# Patient Record
Sex: Male | Born: 1965
Health system: Southern US, Community
[De-identification: ages and names within clinical notes are randomized; demographics above are authoritative.]

## PROBLEM LIST (undated history)

## (undated) DIAGNOSIS — F329 Major depressive disorder, single episode, unspecified: Secondary | ICD-10-CM

## (undated) DIAGNOSIS — E785 Hyperlipidemia, unspecified: Secondary | ICD-10-CM

## (undated) DIAGNOSIS — F1910 Other psychoactive substance abuse, uncomplicated: Secondary | ICD-10-CM

## (undated) DIAGNOSIS — T4145XA Adverse effect of unspecified anesthetic, initial encounter: Secondary | ICD-10-CM

## (undated) DIAGNOSIS — M109 Gout, unspecified: Secondary | ICD-10-CM

## (undated) DIAGNOSIS — F32A Depression, unspecified: Secondary | ICD-10-CM

## (undated) DIAGNOSIS — R251 Tremor, unspecified: Secondary | ICD-10-CM

## (undated) DIAGNOSIS — T8859XA Other complications of anesthesia, initial encounter: Secondary | ICD-10-CM

## (undated) DIAGNOSIS — D869 Sarcoidosis, unspecified: Secondary | ICD-10-CM

## (undated) DIAGNOSIS — B009 Herpesviral infection, unspecified: Secondary | ICD-10-CM

## (undated) DIAGNOSIS — T7840XA Allergy, unspecified, initial encounter: Secondary | ICD-10-CM

## (undated) DIAGNOSIS — K219 Gastro-esophageal reflux disease without esophagitis: Secondary | ICD-10-CM

## (undated) DIAGNOSIS — M545 Low back pain, unspecified: Secondary | ICD-10-CM

## (undated) DIAGNOSIS — I10 Essential (primary) hypertension: Secondary | ICD-10-CM

## (undated) HISTORY — DX: Low back pain, unspecified: M54.50

## (undated) HISTORY — DX: Depression, unspecified: F32.A

## (undated) HISTORY — DX: Herpesviral infection, unspecified: B00.9

## (undated) HISTORY — PX: UMBILICAL HERNIA REPAIR: SHX196

## (undated) HISTORY — DX: Gout, unspecified: M10.9

## (undated) HISTORY — DX: Other psychoactive substance abuse, uncomplicated: F19.10

## (undated) HISTORY — DX: Allergy, unspecified, initial encounter: T78.40XA

## (undated) HISTORY — DX: Hyperlipidemia, unspecified: E78.5

## (undated) HISTORY — DX: Major depressive disorder, single episode, unspecified: F32.9

## (undated) HISTORY — DX: Low back pain: M54.5

## (undated) HISTORY — PX: APPENDECTOMY: SHX54

## (undated) HISTORY — DX: Sarcoidosis, unspecified: D86.9

## (undated) HISTORY — PX: BACK SURGERY: SHX140

## (undated) SURGERY — Surgical Case
Anesthesia: *Unknown

---

## 1993-08-28 HISTORY — PX: KNEE ARTHROPLASTY: SHX992

## 1998-09-14 ENCOUNTER — Ambulatory Visit (HOSPITAL_COMMUNITY): Admission: RE | Admit: 1998-09-14 | Discharge: 1998-09-14 | Payer: Self-pay | Admitting: Internal Medicine

## 2001-11-05 ENCOUNTER — Ambulatory Visit (HOSPITAL_BASED_OUTPATIENT_CLINIC_OR_DEPARTMENT_OTHER): Admission: RE | Admit: 2001-11-05 | Discharge: 2001-11-05 | Payer: Self-pay | Admitting: General Surgery

## 2003-01-31 ENCOUNTER — Encounter: Payer: Self-pay | Admitting: Internal Medicine

## 2003-01-31 ENCOUNTER — Encounter: Admission: RE | Admit: 2003-01-31 | Discharge: 2003-01-31 | Payer: Self-pay | Admitting: Internal Medicine

## 2003-05-11 ENCOUNTER — Encounter: Payer: Self-pay | Admitting: Emergency Medicine

## 2003-05-11 ENCOUNTER — Observation Stay (HOSPITAL_COMMUNITY): Admission: EM | Admit: 2003-05-11 | Discharge: 2003-05-12 | Payer: Self-pay | Admitting: Emergency Medicine

## 2003-05-15 ENCOUNTER — Encounter: Payer: Self-pay | Admitting: Internal Medicine

## 2003-05-15 ENCOUNTER — Encounter: Admission: RE | Admit: 2003-05-15 | Discharge: 2003-05-15 | Payer: Self-pay | Admitting: Internal Medicine

## 2003-05-18 ENCOUNTER — Encounter: Admission: RE | Admit: 2003-05-18 | Discharge: 2003-05-18 | Payer: Self-pay | Admitting: Internal Medicine

## 2003-05-18 ENCOUNTER — Encounter: Payer: Self-pay | Admitting: Internal Medicine

## 2003-05-19 ENCOUNTER — Encounter (INDEPENDENT_AMBULATORY_CARE_PROVIDER_SITE_OTHER): Payer: Self-pay

## 2003-05-19 ENCOUNTER — Ambulatory Visit: Admission: RE | Admit: 2003-05-19 | Discharge: 2003-05-19 | Payer: Self-pay | Admitting: Internal Medicine

## 2003-09-18 ENCOUNTER — Ambulatory Visit (HOSPITAL_BASED_OUTPATIENT_CLINIC_OR_DEPARTMENT_OTHER): Admission: RE | Admit: 2003-09-18 | Discharge: 2003-09-18 | Payer: Self-pay | Admitting: Internal Medicine

## 2003-10-11 ENCOUNTER — Emergency Department (HOSPITAL_COMMUNITY): Admission: AD | Admit: 2003-10-11 | Discharge: 2003-10-11 | Payer: Self-pay | Admitting: Family Medicine

## 2003-12-25 ENCOUNTER — Emergency Department (HOSPITAL_COMMUNITY): Admission: EM | Admit: 2003-12-25 | Discharge: 2003-12-25 | Payer: Self-pay | Admitting: Family Medicine

## 2004-08-09 ENCOUNTER — Encounter: Admission: RE | Admit: 2004-08-09 | Discharge: 2004-11-07 | Payer: Self-pay | Admitting: Internal Medicine

## 2004-10-07 ENCOUNTER — Encounter: Admission: RE | Admit: 2004-10-07 | Discharge: 2004-10-07 | Payer: Self-pay | Admitting: Internal Medicine

## 2007-01-08 ENCOUNTER — Emergency Department (HOSPITAL_COMMUNITY): Admission: EM | Admit: 2007-01-08 | Discharge: 2007-01-08 | Payer: Self-pay | Admitting: Family Medicine

## 2007-02-01 ENCOUNTER — Ambulatory Visit: Payer: Self-pay | Admitting: Internal Medicine

## 2007-10-30 ENCOUNTER — Ambulatory Visit: Payer: Self-pay | Admitting: Internal Medicine

## 2007-10-30 DIAGNOSIS — D869 Sarcoidosis, unspecified: Secondary | ICD-10-CM

## 2007-10-30 DIAGNOSIS — G4700 Insomnia, unspecified: Secondary | ICD-10-CM

## 2007-11-04 ENCOUNTER — Telehealth (INDEPENDENT_AMBULATORY_CARE_PROVIDER_SITE_OTHER): Payer: Self-pay | Admitting: *Deleted

## 2007-11-08 ENCOUNTER — Emergency Department (HOSPITAL_COMMUNITY): Admission: EM | Admit: 2007-11-08 | Discharge: 2007-11-08 | Payer: Self-pay | Admitting: Emergency Medicine

## 2007-11-13 ENCOUNTER — Ambulatory Visit: Payer: Self-pay | Admitting: Adult Health

## 2007-11-13 DIAGNOSIS — J019 Acute sinusitis, unspecified: Secondary | ICD-10-CM | POA: Insufficient documentation

## 2007-11-26 ENCOUNTER — Telehealth: Payer: Self-pay | Admitting: Internal Medicine

## 2007-12-04 ENCOUNTER — Ambulatory Visit: Payer: Self-pay | Admitting: Internal Medicine

## 2008-01-08 ENCOUNTER — Ambulatory Visit: Payer: Self-pay | Admitting: Internal Medicine

## 2008-02-06 ENCOUNTER — Ambulatory Visit: Payer: Self-pay | Admitting: Internal Medicine

## 2008-10-08 ENCOUNTER — Ambulatory Visit: Payer: Self-pay | Admitting: Internal Medicine

## 2008-10-19 ENCOUNTER — Ambulatory Visit: Payer: Self-pay | Admitting: Internal Medicine

## 2008-11-02 ENCOUNTER — Ambulatory Visit: Payer: Self-pay | Admitting: Internal Medicine

## 2009-01-14 ENCOUNTER — Ambulatory Visit: Payer: Self-pay | Admitting: Internal Medicine

## 2009-05-20 ENCOUNTER — Encounter: Payer: Self-pay | Admitting: Internal Medicine

## 2009-05-28 ENCOUNTER — Ambulatory Visit: Payer: Self-pay | Admitting: Internal Medicine

## 2009-07-27 ENCOUNTER — Ambulatory Visit: Payer: Self-pay | Admitting: Internal Medicine

## 2009-07-27 DIAGNOSIS — J069 Acute upper respiratory infection, unspecified: Secondary | ICD-10-CM | POA: Insufficient documentation

## 2010-01-18 ENCOUNTER — Ambulatory Visit: Payer: Self-pay | Admitting: Internal Medicine

## 2010-02-22 ENCOUNTER — Ambulatory Visit: Payer: Self-pay | Admitting: Internal Medicine

## 2010-02-22 DIAGNOSIS — K219 Gastro-esophageal reflux disease without esophagitis: Secondary | ICD-10-CM

## 2010-02-22 DIAGNOSIS — I10 Essential (primary) hypertension: Secondary | ICD-10-CM

## 2010-02-23 LAB — CONVERTED CEMR LAB
Albumin: 4.2 g/dL (ref 3.5–5.2)
Alkaline Phosphatase: 79 units/L (ref 39–117)
Basophils Relative: 0.8 % (ref 0.0–3.0)
Calcium: 8.9 mg/dL (ref 8.4–10.5)
Creatinine, Ser: 0.7 mg/dL (ref 0.4–1.5)
Eosinophils Relative: 4 % (ref 0.0–5.0)
GFR calc non Af Amer: 125.34 mL/min (ref >60)
Glucose, Bld: 76 mg/dL (ref 70–99)
Hemoglobin: 13.9 g/dL (ref 13.0–17.0)
Lymphocytes Relative: 21 % (ref 12.0–46.0)
MCHC: 34 g/dL (ref 30.0–36.0)
Monocytes Relative: 9.6 % (ref 3.0–12.0)
Neutro Abs: 3.6 10*3/uL (ref 1.4–7.7)
RBC: 4.5 M/uL (ref 4.22–5.81)
Sodium: 141 meq/L (ref 135–145)
TSH: 0.92 microintl units/mL (ref 0.35–5.50)
WBC: 5.7 10*3/uL (ref 4.5–10.5)

## 2010-02-24 ENCOUNTER — Ambulatory Visit: Payer: Self-pay | Admitting: Internal Medicine

## 2010-02-24 LAB — CONVERTED CEMR LAB: Angiotensin 1 Converting Enzyme: 32 units/L (ref 9–67)

## 2010-04-05 ENCOUNTER — Encounter (INDEPENDENT_AMBULATORY_CARE_PROVIDER_SITE_OTHER): Payer: Self-pay | Admitting: *Deleted

## 2010-04-05 ENCOUNTER — Ambulatory Visit: Payer: Self-pay | Admitting: Internal Medicine

## 2010-09-27 NOTE — Miscellaneous (Signed)
  Clinical Lists Changes  Orders: Added new Service order of Pneumococcal Vaccine (16109) - Signed Added new Service order of Admin 1st Vaccine (60454) - Signed Added new Service order of Admin 1st Vaccine [facility]) [phone]) - Signed Observations: Added new observation of PNEUMOVAXVIS: 03/25/96 version given April 05, 2010. (04/05/2010 10:00) Added new observation of PNEUMOVAXLOT: 0578aa (04/05/2010 10:00) Added new observation of PNEUMOVAXEXP: 09/14/2011 (04/05/2010 10:00) Added new observation of PNEUMOVAXBY: Alida Rogers CMA (04/05/2010 10:00) Added new observation of PNEUMOVAXRTE: IM (04/05/2010 10:00) Added new observation of PNEUMOVAXMFR: Merck (04/05/2010 10:00) Added new observation of PNEUMOVAXSIT: left deltoid (04/05/2010 10:00) Added new observation of PNEUMOVAX: Pneumovax (04/05/2010 10:00)      Orders Added: 1)  Pneumococcal Vaccine [90732] 2)  Admin 1st Vaccine [90471] 3)  Admin 1st Vaccine [facility]) [14782N]    Pneumovax Vaccine    Vaccine Type: Pneumovax    Site: left deltoid    Mfr: Merck    Dose: 0.5 ml    Route: IM    Given by: Kandice Hams CMA    Exp. Date: 09/14/2011    Lot #: [id number]    VIS given: 03/25/96 version given April 05, 2010.

## 2010-09-27 NOTE — Miscellaneous (Signed)
  Clinical Lists Changes  Observations: Added new observation of PNEUMOVAX: Pneumovax (04/05/2010 9:58)      Pneumovax Immunization History:    Pneumovax # 1:  Pneumovax (04/05/2010)

## 2010-09-27 NOTE — Assessment & Plan Note (Signed)
Summary: Pulmonary/ acute ext ov ? sarcoid symptoms - not clear   Primary Provider/Referring Provider:  [name]  CC:  4 wk followup.  Pt states that night sweats are less frequent and less severe.  He states that the cough has resolved.  Breathing better.  No complaints today.Marland Kitchen  History of Present Illness:  44-yowm with minimal smoking hx , known hx of sarcoid (Positive transbronchial biopsy 05/19/03) presenting as asymptomatic hilaradenopathy.  Hx of atypical chest pain that we treated with Protonix after a negative cardiac workup was completed on May 13, 2003.   Now on Prednisone daily since 4/09 for cough that flared while on PPI and is  100%  better on Prednisone 10mg  1 each am   July 27, 2009 no flare of cough  chronically but  then acute onset   cough x 3 days with dark green sputum.  No other complaints.  Jan 18, 2010 --Presents for an acute office viist. Complains of sarcod flare w/  dry cough, sweats, fatigue x6days. Had been doing well for last several months until the past week. Complains of dry cough, fatigue for 1 week. "feels like his flares w/ sarcoid in past". Prednisone 10mg  2 tabs once daily for 2 weeks, then 1 tab once daily for 1 week and stop Delsym and tramadol as needed for cough.   February 22, 2010 ov c/o night sweats are less frequent and less severe.  He states that the cough has resolved.  Breathing better.  No complaints today. no needing tramadol at all but still feeling not quite right even on 20 mg prednisone per day though did eliminate the sarcoid and now tapered back off.  Pt denies any significant sore throat, dysphagia, itching, sneezing,  nasal congestion or excess secretions,  fever, chills, sweats, unintended wt loss, pleuritic or exertional cp, hempoptysis, change in activity tolerance  orthopnea pnd or leg swelling. Pt also denies any obvious fluctuation in symptoms with weather or environmental change or other alleviating or aggravating factors.       Current Medications (verified): 1)  Adult Aspirin Low Strength 81 Mg  Tbdp (Aspirin) .... Once Daily 2)  Aciphex 20 Mg  Tbec (Rabeprazole Sodium) .... Every Morning 3)  Zantac 150 Maximum Strength 150 Mg  Tabs (Ranitidine Hcl) .... At Bedtime 4)  Ultram 50 Mg  Tabs (Tramadol Hcl) .Marland Kitchen.. 1 By Mouth Every 4 Hrs As Needed. 5)  Delsym 30 Mg/55ml  Lqcr (Dextromethorphan Polistirex) .... 2 Tsp Two Times A Day As Needed Cough  Allergies (verified): No Known Drug Allergies  Past History:  Past Medical History: INSOMNIA (ICD-780.52) PULMONARY SARCOIDOSIS (ICD-135) .........................Marland KitchenWert     - Positive transbronchial biopsy 05/19/03     - Ophth eval done each Fall > no sarcoid    Vital Signs:  Patient profile:   45 year old male Weight:      213 pounds O2 Sat:      96 % on Room air Temp:     98.2 degrees F oral Pulse rate:   71 / minute BP sitting:   140 / 90  (right arm)  Vitals Entered By: Vernie Murders (February 22, 2010 10:05 AM)  O2 Flow:  Room air  Physical Exam  Additional Exam:  wt 193 > 206 July 27, 2009 > 213 February 22, 2010  Ambulatory healthy appearing in no acute distress. Afeb with normal vital signs HEENT: nl dentition, turbinates, and orophanx. Nl external ear canals without cough reflex Neck without JVD/Nodes/TM Lungs clear  to A and P bilaterally without cough on insp or exp maneuvers RRR no s3 or murmur or increase in P2 Abd soft and benign with nl excursion in the supine position. No bruits or organomegaly Ext warm without calf tenderness, cyanosis clubbing or edema Skin warm and dry without lesions      Sodium                    141 mEq/L                   135-145   Potassium                 4.2 mEq/L                   3.5-5.1   Chloride                  104 mEq/L                   96-112   Carbon Dioxide            29 mEq/L                    19-32   Glucose                   76 mg/dL                    56-43   BUN                       10 mg/dL                     3-29   Creatinine                0.7 mg/dL                   5.1-8.8   Calcium                   8.9 mg/dL                   4.1-66.0   GFR                       125.34 mL/min               >60  Tests: (2) Hepatic/Liver Function Panel (HEPATIC)   Total Bilirubin           0.7 mg/dL                   6.3-0.1   Direct Bilirubin          0.1 mg/dL                   6.0-1.0   Alkaline Phosphatase      79 U/L                      39-117   AST                       25 U/L                      0-37   ALT  32 U/L                      0-53   Total Protein             7.3 g/dL                    5.3-6.6   Albumin                   4.2 g/dL                    4.4-0.3  Tests: (3) TSH (TSH)   FastTSH                   0.92 uIU/mL                 0.35-5.50  Tests: (4) CBC Platelet w/Diff (CBCD)   White Cell Count          5.7 K/uL                    4.5-10.5   Red Cell Count            4.50 Mil/uL                 4.22-5.81   Hemoglobin                13.9 g/dL                   47.4-25.9   Hematocrit                41.0 %                      39.0-52.0   MCV                       91.1 fl                     78.0-100.0   MCHC                      34.0 g/dL                   56.3-87.5   RDW                       13.3 %                      11.5-14.6   Platelet Count            247.0 K/uL                  150.0-400.0   Neutrophil %              64.6 %                      43.0-77.0   Lymphocyte %              21.0 %                      12.0-46.0   Monocyte %                9.6 %  3.0-12.0   Eosinophils%              4.0 %                       0.0-5.0   Basophils %               0.8 %                       0.0-3.0   Neutrophill Absolute      3.6 K/uL                    1.4-7.7   Lymphocyte Absolute       1.2 K/uL                    0.7-4.0   Monocyte Absolute         0.5 K/uL                    0.1-1.0  Eosinophils, Absolute                              0.2 K/uL                    0.0-0.7   Basophils Absolute        0.0 K/uL                    0.0-0.1  Tests: (5) Sed Rate (ESR)   Sed Rate             [H]  25 mm/hr                    0-22  CXR  Procedure date:  02/22/2010  Findings:        Comparison: Chest x-ray of 07/27/2009   Findings: The lungs are clear.  The mediastinal contours appear normal. The heart remains within upper limits of normal in size. No bony abnormality is seen.   IMPRESSION: Stable chest x-ray.  No active lung disease.  Impression & Recommendations:  Problem # 1:  PULMONARY SARCOIDOSIS (ICD-135) A good general rule of thumb is that 95% of pts with active sarcoid have an abn cxr (which he doesn't) and most pts with sarcoid with abn cxr don't needed to be treated with steroids because there's little evidence that we change the natural hx of the disease with steroids but cause considerable collateral damage with this drug chronically.  for now leave off prednisone and check ace  Discussed in detail all the  indications, usual  risks and alternatives  relative to the benefits with patient who agrees to proceed with  conservative f/u off steroids  Problem # 2:  GERD (ICD-530.81) flare of symptoms off rx so continue max rx plus diet His updated medication list for this problem includes:    Aciphex 20 Mg Tbec (Rabeprazole sodium) ..... Every morning    Zantac 150 Maximum Strength 150 Mg Tabs (Ranitidine hcl) .Marland Kitchen... At bedtime  Problem # 3:  HYPERTENSION, BENIGN (ICD-401.1) borderline, watch salt intake, no rx for now  Other Orders: T-Angiotensin i-Converting Enzyme (16109-60454) T-2 View CXR (71020TC) TLB-BMP (Basic Metabolic Panel-BMET) (80048-METABOL) TLB-Hepatic/Liver Function Pnl (80076-HEPATIC) TLB-TSH (Thyroid Stimulating Hormone) (84443-TSH) TLB-CBC Platelet - w/Differential (85025-CBCD) TLB-Sedimentation Rate (ESR) (85652-ESR) Est. Patient Level IV (09811)  Patient Instructions: 1)  Avoid salt 2)  we will call  labs when available 3)  Please schedule a follow-up appointment in 6 weeks, sooner if needed

## 2010-09-27 NOTE — Assessment & Plan Note (Signed)
Summary: Pulmonary/ ext f/u ov no active sarcoid   Primary Provider/Referring Provider:  [name]  CC:  6 wk followup.  Pt c/o cough x 2 wks- prod with dark yellow.  Denies any change in breathing.  States night sweats pretty much have resolved.Marland Kitchen  History of Present Illness:  44-yowm with minimal smoking hx , known hx of sarcoid (Positive transbronchial biopsy 05/19/03) presenting as asymptomatic hilaradenopathy.  Hx of atypical chest pain that we treated with Protonix after a negative cardiac workup was completed on May 13, 2003.   Now on Prednisone daily since 4/09 for cough that flared while on PPI and is  100%  better on Prednisone 10mg  1 each am   July 27, 2009 no flare of cough  chronically but  then acute onset   cough x 3 days with dark green sputum.  No other complaints.  Jan 18, 2010 --Presents for an acute office viist. Complains of sarcod flare w/  dry cough, sweats, fatigue x6days. Had been doing well for last several months until the past week. Complains of dry cough, fatigue for 1 week. "feels like his flares w/ sarcoid in past". Prednisone 10mg  2 tabs once daily for 2 weeks, then 1 tab once daily for 1 week and stop Delsym and tramadol as needed for cough.   February 22, 2010 ov c/o night sweats are less frequent and less severe.  He states that the cough has resolved.  Breathing better.  No complaints today. not needing tramadol at all but still didn't feel  quite right even on 20 mg prednisone per day though did eliminate the sarcoid and tapered back off without change in symptoms. ace/ lfts/calcium and cxr normal,   bp up , rec no salt and observe off prednisone  April 05, 2010 6 wk followup.  Pt c/o cough x 2 wks- prod with dark yellow.  Denies any change in breathing.  States night sweats pretty much have resolved. feels getting over head/chest cold developed in [place].  Pt denies any significant sore throat, dysphagia, itching, sneezing,  nasal congestion or excess  secretions,  fever, chills, sweats, unintended wt loss, pleuritic or exertional cp, hempoptysis, change in activity tolerance  orthopnea pnd or leg swelling     Current Medications (verified): 1)  Adult Aspirin Low Strength 81 Mg  Tbdp (Aspirin) .... Once Daily 2)  Aciphex 20 Mg  Tbec (Rabeprazole Sodium) .... Every Morning 3)  Zantac 150 Maximum Strength 150 Mg  Tabs (Ranitidine Hcl) .... At Bedtime 4)  Delsym 30 Mg/74ml  Lqcr (Dextromethorphan Polistirex) .... 2 Tsp Two Times A Day As Needed Cough  Allergies (verified): No Known Drug Allergies  Past History:  Past Medical History: INSOMNIA (ICD-780.52) PULMONARY SARCOIDOSIS (ICD-135) .........................Marland KitchenWert     - Positive transbronchial biopsy 05/19/03     - Opth eval done each Fall > no sarcoid (Dr Lindell Spar office) Health Maintenance.........................................................Eden Emms [name]       - Pneumovax April 05, 2010 ( age 24, booster needed age 60)        Vital Signs:  Patient profile:   45 year old male Weight:      215.50 pounds O2 Sat:      98 % on Room air Temp:     98.2 degrees F oral Pulse rate:   96 / minute BP sitting:   130 / 80  (left arm)  Vitals Entered By: Vernie Murders (April 05, 2010 9:36 AM)  O2 Flow:  Room air  Physical Exam  Additional Exam:  wt  206 July 27, 2009 > 213 February 22, 2010 > 215 April 05, 2010  Ambulatory healthy appearing in no acute distress   HEENT: nl dentition, turbinates, and orophanx. Nl external ear canals without cough reflex Neck without JVD/Nodes/TM Lungs clear to A and P bilaterally without cough on insp or exp maneuvers RRR no s3 or murmur or increase in P2 Abd soft and benign with nl excursion in the supine position. No bruits or organomegaly Ext warm without calf tenderness, cyanosis clubbing or edema Skin warm and dry without lesions     Impression & Recommendations:  Problem # 1:  URI, ACUTE (ICD-465.9)  His updated medication  list for this problem includes:    Adult Aspirin Low Strength 81 Mg Tbdp (Aspirin) ..... Once daily    Delsym 30 Mg/11ml Lqcr (Dextromethorphan polistirex) .Marland Kitchen... 2 tsp two times a day as needed cough  Explained natural h/o uri and why it's necessary in patients at risk to rx short term with PPI to reduce risk of evolving cyclical cough triggered by epithelial injury and a heightened sensitivty to the effects of any upper airway irritants,  most importantly acid - related  rx tendency to cyclical cough with tramdol as needed, discussed in detail    Orders: Est. Patient Level IV (16109)  Problem # 2:  PULMONARY SARCOIDOSIS (ICD-135) No evidence of dz, f/u optional yearly in June Opth f/u yearly rec  Problem # 3:  HYPERTENSION, BENIGN (ICD-401.1)  ok off salt, f/u per Dr Lurlean Nanny Lenord Fellers  Orders: Est. Patient Level IV (60454)  Medications Added to Medication List This Visit: 1)  Tramadol Hcl 50 Mg Tabs (Tramadol hcl) .... One to two by mouth every 4-6 hours if needed  Patient Instructions: 1)  Copy sent to: Eden Emms [name] 2)  Pulmonary follow up is optional at this point with yearly follow up if you wish June of each year Prescriptions: TRAMADOL HCL 50 MG  TABS (TRAMADOL HCL) One to two by mouth every 4-6 hours if needed  #40 x 0   Entered and Authorized by:   Nyoka Cowden MD   Signed by:   Nyoka Cowden MD on 04/05/2010   Method used:   Electronically to        [facility]* (retail)       [address]       [place], [place]  [id number]       Ph: [phone] or [phone]       Fax: [phone]   RxID:   [id number]

## 2010-09-27 NOTE — Assessment & Plan Note (Signed)
Summary: Acute NP office visit - sacroid flare   Primary Provider/Referring Provider:  [name]  CC:  dry cough, sweats, fatigue x6days - denies chest congestion, wheezing, and SOB.  History of Present Illness: This is a 45-yowm with minimal smoking hx , known hx of sarcoid (Positive transbronchial biopsy 05/19/03) presenting as asymptomatic hilaradenopathy.  Hx of atypical chest pain that we treated with Protonix after a negative cardiac workup was completed on May 13, 2003.   Now on Prednisone daily since 4/09 for cough that flared while on PPI and is  100%  better on Prednisone 10mg  1 each am   July 27, 2009 no flare of cough  chronically but  then acute onset   cough x 3 days with dark green sputum.  No other complaints.  Jan 18, 2010 --Presents for an acute office viist. Complains of sarcod flare w/  dry cough, sweats, fatigue x6days. Had been doing well for last several months until the past week. Complains of dry cough, fatigue for 1 week. "feels like his flares w/ sarcoid in past". He denies fever or discolored mucus. Denies chest pain, dyspnea, orthopnea, hemoptysis, fever, n/v/d, edema, headache, rash.   Preventive Screening-Counseling & Management  Alcohol-Tobacco     Smoking Status: quit  Medications Prior to Update: 1)  Adult Aspirin Low Strength 81 Mg  Tbdp (Aspirin) .... Once Daily 2)  Aciphex 20 Mg  Tbec (Rabeprazole Sodium) .... Every Morning 3)  Zantac 150 Maximum Strength 150 Mg  Tabs (Ranitidine Hcl) .... At Bedtime 4)  Ultram 50 Mg  Tabs (Tramadol Hcl) .Marland Kitchen.. 1 By Mouth Every 4 Hrs As Needed. 5)  Delsym 30 Mg/39ml  Lqcr (Dextromethorphan Polistirex) .... 2 Tsp Two Times A Day As Needed Cough 6)  Doxycycline Monohydrate 100 Mg  Caps (Doxycycline Monohydrate) .... By Mouth Twice Daily X 7 Days  Current Medications (verified): 1)  Adult Aspirin Low Strength 81 Mg  Tbdp (Aspirin) .... Once Daily 2)  Aciphex 20 Mg  Tbec (Rabeprazole Sodium) .... Every Morning 3)   Zantac 150 Maximum Strength 150 Mg  Tabs (Ranitidine Hcl) .... At Bedtime 4)  Ultram 50 Mg  Tabs (Tramadol Hcl) .Marland Kitchen.. 1 By Mouth Every 4 Hrs As Needed. 5)  Delsym 30 Mg/78ml  Lqcr (Dextromethorphan Polistirex) .... 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  Family History: Negative for respiratory diseases or atopy heart disease - MGF  Social History: quit smoking in 2002 but was never a heavy smoker rare alcohol 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 No Comments Medications reviewed with patient Daytime contact number verified with patient. Boone Master CNA/MA  Jan 18, 2010 10:38 AM    Physical Exam  Additional Exam:  wt 193 > 206 July 27, 2009  Ambulatory healthy appearing in no acute distress. Afeb with normal vital signs HEENT: nl dentition, turbinates, and orophanx. Nl external ear canals without cough reflex Neck without JVD/Nodes/TM Lungs clear to A and P bilaterally without  cough on insp or exp maneuvers RRR no s3 or murmur or increase in P2 Abd soft and benign with nl excursion in the supine position. No bruits or organomegaly Ext warm without calf tenderness, cyanosis clubbing or edema Skin warm and dry without lesions     Impression & Recommendations:  Problem # 1:  PULMONARY SARCOIDOSIS (ICD-135) Flare:  REC:  Prednisone 10mg  2 tabs once daily for 2 weeks, then 1 tab once daily for 1 week and stop Delsym and tramadol as needed for cough.  Please contact office for sooner follow up if symptoms do not improve or worsen  Dr. Sherene Sires in 4 weeks   Medications Added to Medication List This Visit: 1)  Prednisone 10 Mg Tabs (Prednisone) .... 2 by mouth once daily for 2 weeks then 1 by mouth once daily for 1 week then stop  Complete Medication List: 1)  Adult Aspirin Low Strength 81 Mg Tbdp (Aspirin) .... Once daily 2)  Aciphex 20 Mg Tbec (Rabeprazole sodium) .... Every morning 3)  Zantac 150 Maximum Strength 150 Mg Tabs (Ranitidine hcl) .... At bedtime 4)  Ultram 50 Mg Tabs (Tramadol hcl) .Marland Kitchen.. 1 by mouth every 4 hrs as needed. 5)  Delsym 30 Mg/42ml Lqcr (Dextromethorphan polistirex) .... 2 tsp two times a day as needed cough 6)  Prednisone 10 Mg Tabs (Prednisone) .... 2 by mouth once daily for 2 weeks then 1 by mouth once daily for 1 week then stop  Other Orders: Est. Patient Level IV (56213)  Patient Instructions: 1)  Prednisone 10mg  2 tabs once daily for 2 weeks, then 1 tab once daily for 1 week and stop 2)  Delsym and tramadol as needed for cough.  3)  Please contact office for sooner follow up if symptoms do not improve or worsen  4)  Dr. Sherene Sires in 4 weeks  Prescriptions: ULTRAM 50 MG  TABS (TRAMADOL HCL) 1 by mouth every 4 hrs as needed.  #40 Tablet x 0   Entered and Authorized by:   Rubye Oaks NP   Signed by:   Cris Talavera NP on 01/18/2010   Method used:   Electronically to        [facility]* (retail)       [address]        [place], [place]  [address]       Ph: [phone] or [phone]       Fax: [phone]   RxID:   [id number] PREDNISONE 10 MG TABS (PREDNISONE) 2 by mouth once daily for 2 weeks then 1 by mouth once daily for 1 week then stop  #21 x 0   Entered and Authorized by:   Rubye Oaks NP   Signed by:   Avi Kerschner NP on 01/18/2010   Method used:   Electronically to        [facility]* (retail)  [address]       [place], [place]  [id number]       Ph: [phone] or [phone]       Fax: [phone]   RxID:   [id number]    Immunization History:  Influenza Immunization History:    Influenza:  historical (08/28/2009)

## 2010-12-22 ENCOUNTER — Other Ambulatory Visit (INDEPENDENT_AMBULATORY_CARE_PROVIDER_SITE_OTHER): Payer: 59

## 2010-12-22 ENCOUNTER — Ambulatory Visit (INDEPENDENT_AMBULATORY_CARE_PROVIDER_SITE_OTHER)
Admission: RE | Admit: 2010-12-22 | Discharge: 2010-12-22 | Disposition: A | Discharge status: Home / Self Care | Payer: 59 | Source: Ambulatory Visit | Attending: Internal Medicine | Admitting: Internal Medicine

## 2010-12-22 ENCOUNTER — Encounter: Payer: Self-pay | Admitting: Internal Medicine

## 2010-12-22 ENCOUNTER — Ambulatory Visit (INDEPENDENT_AMBULATORY_CARE_PROVIDER_SITE_OTHER): Payer: 59 | Admitting: Internal Medicine

## 2010-12-22 VITALS — BP 138/82 | HR 73 | Temp 97.9°F | Ht 73.0 in | Wt 219.0 lb

## 2010-12-22 DIAGNOSIS — T148 Other injury of unspecified body region: Secondary | ICD-10-CM

## 2010-12-22 DIAGNOSIS — D869 Sarcoidosis, unspecified: Secondary | ICD-10-CM

## 2010-12-22 DIAGNOSIS — W57XXXA Bitten or stung by nonvenomous insect and other nonvenomous arthropods, initial encounter: Secondary | ICD-10-CM

## 2010-12-22 DIAGNOSIS — K219 Gastro-esophageal reflux disease without esophagitis: Secondary | ICD-10-CM

## 2010-12-22 LAB — CBC WITH DIFFERENTIAL/PLATELET
Basophils Absolute: 0 10*3/uL (ref 0.0–0.1)
Eosinophils Absolute: 0.2 10*3/uL (ref 0.0–0.7)
HCT: 44.2 % (ref 39.0–52.0)
Lymphs Abs: 1.2 10*3/uL (ref 0.7–4.0)
MCHC: 34.3 g/dL (ref 30.0–36.0)
MCV: 95.2 fl (ref 78.0–100.0)
Monocytes Absolute: 0.5 10*3/uL (ref 0.1–1.0)
Neutro Abs: 3.6 10*3/uL (ref 1.4–7.7)
Platelets: 262 10*3/uL (ref 150.0–400.0)
RDW: 12.6 % (ref 11.5–14.6)

## 2010-12-22 LAB — BASIC METABOLIC PANEL
CO2: 29 mEq/L (ref 19–32)
GFR: 131.17 mL/min (ref >60.00)
Glucose, Bld: 82 mg/dL (ref 70–99)
Potassium: 4.4 mEq/L (ref 3.5–5.1)
Sodium: 139 mEq/L (ref 135–145)

## 2010-12-22 LAB — SEDIMENTATION RATE: Sed Rate: 16 mm/hr (ref 0–22)

## 2010-12-22 LAB — HEPATIC FUNCTION PANEL
Albumin: 3.9 g/dL (ref 3.5–5.2)
Total Bilirubin: 1 mg/dL (ref 0.3–1.2)

## 2010-12-22 MED ORDER — TRAMADOL HCL 50 MG PO TABS: 50.0000 mg | ORAL_TABLET | Freq: Four times a day (QID) | ORAL | Status: DC | PRN

## 2010-12-22 MED ORDER — DOXYCYCLINE HYCLATE 100 MG PO TABS: ORAL_TABLET | ORAL | Status: DC

## 2010-12-22 NOTE — Progress Notes (Signed)
  Subjective:    Patient ID: Charles Villarreal, male    DOB: [birth year], 45 y.o.   MRN: [id number]  HPI  Primary:  Charles Villarreal   44-yowm with minimal smoking hx , dx  of sarcoid (Positive transbronchial biopsy 05/19/03)  With  asymptomatic hilar adenopathy. Hx of atypical chest pain that we treated with Protonix after a negative cardiac workup  was completed on May 13, 2003.  Did not require chronic steroids  Jan 18, 2010 --ov/ cc  sarcod flare w/ dry cough, sweats, fatigue x6days. Had been doing well for last several months until the past week. Cc dry cough, fatigue for 1 week." like his flares w/ sarcoid in past". Prednisone 10mg  2 tabs once daily for 2 weeks, then 1 tab once daily for 1 week and stop  Delsym and tramadol as needed for cough.   February 22, 2010 ov c/o night sweats  less frequent and less severe.  cough resolved. Breathing better. No complaints today. not needing tramadol at all but still didn't feel quite right even on 20 mg prednisone per day so assumed this was not sarcoid and tapered   Off pred  without change in symptoms. ace/ lfts/calcium and cxr normal, bp up , rec no salt and observe off prednisone    12/22/2010 ov / Charles Villarreal  Cc freq colds assoc with cough, fatigue and sob. Tick bite one week prior to ov, embedded but not engorged.  Minimal dry cough, no rash or arthralgias.  Pt denies any significant sore throat, dysphagia, itching, sneezing,  nasal congestion or excess/ purulent secretions,  fever, chills, sweats, unintended wt loss, pleuritic or exertional cp, hempoptysis, orthopnea pnd or leg swelling.    Also denies any obvious fluctuation of symptoms with weather or environmental changes or other aggravating or alleviating factors.         Past Medical History:  INSOMNIA (ICD-780.52)  PULMONARY SARCOIDOSIS (ICD-135) .........................Marland KitchenWert  - Positive transbronchial biopsy 05/19/03  - Opth eval done each Fall > no sarcoid (Dr Charles Villarreal office)  Health  Maintenance.........................................................Charles Villarreal  - Pneumovax April 05, 2010 ( age 37, booster needed age 54)            Review of Systems     Objective:   Physical Exam     wt 206 July 27, 2009 > 213 February 22, 2010 > 215 April 05, 2010 > 219 12/22/2010  Ambulatory healthy appearing in no acute distress  HEENT: nl dentition, turbinates, and orophanx. Nl external ear canals without cough reflex  Neck without JVD/Nodes/TM  Lungs clear to A and P bilaterally without cough on insp or exp maneuvers  RRR no s3 or murmur or increase in P2  Abd soft and benign with nl excursion in the supine position. No bruits or organomegaly  Ext warm without calf tenderness, cyanosis clubbing or edema    12/22/10 CHEST - 2 VIEW  Comparison: 02/22/2010.  Findings: Trachea is midline. Heart size normal. Lungs are clear.  No pleural fluid.  IMPRESSION:  No acute findings  Labs nl including esr/ lft's and calcium.    Assessment & Plan:

## 2010-12-22 NOTE — Patient Instructions (Signed)
When you have a cough for any reason add pepcid 20 mg one at bedtime  Take delsym two tsp every 12 hours and supplement if needed with  tramadol 50 mg up to 2 every 4 hours to suppress the urge to cough. Swallowing water or using ice chips/non mint and menthol containing candies (such as lifesavers or sugarless jolly ranchers) are also effective.  You should rest your voice and avoid activities that you know make you cough.  Once you have eliminated the cough for 3 straight days try reducing the tramadol first,  then the delsym as tolerated.    Follow up every year in April sooner if needed

## 2010-12-23 NOTE — Progress Notes (Signed)
Quick Note:  Spoke with pt and notified of results per Dr. Wert. Pt verbalized understanding and denied any questions.  ______ 

## 2010-12-24 DIAGNOSIS — W57XXXA Bitten or stung by nonvenomous insect and other nonvenomous arthropods, initial encounter: Secondary | ICD-10-CM | POA: Insufficient documentation

## 2010-12-24 NOTE — Assessment & Plan Note (Signed)
Explained natural history of uri and why it's necessary in patients at risk to treat GERD aggressively  at least  short term   to reduce risk of evolving cyclical cough initially  triggered by epithelial injury and a heightened sensitivty to the effects of any upper airway irritants,  most importantly acid - related.  That is, the more sensitive the epithelium damaged for virus, the more the cough, the more the secondary reflux (especially in those prone to reflux) the more the irritation of the sensitive mucosa and so on in a cyclical pattern.  

## 2010-12-24 NOTE — Assessment & Plan Note (Signed)
No evidence of active sarcoid now 8 years since dx so very unlikely to recur at this point

## 2010-12-24 NOTE — Assessment & Plan Note (Signed)
Not embedded, minimal residual inflammatory change but Pos risk RMSF and Lyme since it was a [place] tick.  Rec doxy course, f/u prn

## 2011-01-10 NOTE — Assessment & Plan Note (Signed)
[facility]                             PULMONARY OFFICE NOTE   NAME:Charles Villarreal, Charles Villarreal                        MRN:          [id number]  DATE:02/01/2007                            DOB:          1966/08/19    REASON FOR CONSULTATION:  Cough.   HISTORY:  This is a 45 year old white male last seen here in 2004 when  he was diagnosed with sarcoid presenting as asymptomatic hilar  adenopathy.  He was never treated with prednisone, and did have atypical  chest pain that we treated with Protonix after a negative cardiac workup  was completed on May 13, 2003.  The chest pain eventually  resolved, and he stopped the Protonix.  He did fine acute onset of a  cough 6 weeks ago that started like a cold with a sore throat and  green sputum.  He was given a course of antibiotics, which cleared up  the sputum, but not the cough.  He also took lots of cough drops and  mints.  He has been having the cough nonstop, but it especially bothers  him when he tries to lie down at night, and typically wakes him about 1  o'clock in the morning.  He denies any dyspnea, pleuritic, or exertional  chest pain, fevers, chills, sweats, or myalgias, arthralgias, rash, or  ocular complaints.   PAST MEDICAL HISTORY:  Significant for sarcoid as noted above.   ALLERGIES:  NONE KNOWN.   MEDICATIONS:  Baby aspirin and multivitamin daily.   SOCIAL HISTORY:  He has never smoked.  He works Clinical cytogeneticist a Games developer.   FAMILY HISTORY:  Negative for respiratory disease or __________.   REVIEW OF SYSTEMS:  Taken in detail on the worksheet.  Negative, except  as outlined above.   PHYSICAL EXAMINATION:  This is a very pleasant, healthy-appearing,  ambulatory, white male, in no acute distress.  He has stable vital signs.  HEENT:  Unremarkable.  Oropharynx clear.  No evidence of postnasal  drainage or cobblestoning.  Note, that he did clear his throat  frequently during the interview and  exam.  NECK:  Supple without cervical adenopathy or tenderness.  Trachea is  midline.  No thyromegaly.  LUNG FIELDS:  Perfectly clear bilaterally to auscultation and percussion  with no cough elicited on inspiratory or expiratory maneuvers.  HEART:  Has a regular rate and rhythm without murmur, gallop, or rub.  ABDOMEN:  Soft and benign.  EXTREMITIES:  Warm without calf tenderness, cyanosis, clubbing, or  edema.   LABORATORY DATA:  Included a chest x-ray showing no change in hilar and  mediastinal adenopathy compared to previous study from 2004.   IMPRESSION:  The acute onset of the cough with purulent sputum and sore  throat is typical of any upper respiratory tract infection, perhaps  suggesting an underlying sinusitis.  However, now the cough has a life  of its own because of the throat clearing that he is doing, and the  mint and menthol products that he is using, which may contribute to  reflux, which then contributes to cough.  I explained the nature of a cyclical cough to him, and asked him to stop  all mint and menthol products.  Use Aciphex 20 mg b.i.d. before meals,  and also add Zantac at bedtime until the cough is resolved, and to  suppress excess coughing, use Delsym 2 teaspoons every 12 hours  supplemented with Tramadol 30 mg every 4.   If this does not resolve the cough, we will need to see him back here to  reevaluate whether or not sarcoid could be playing a role, but I think  it is unlikely.   Note, this is the 2nd syndrome that is atypical, if reflux is  contributing to it.  First was his chest pain 4 years ago.  It may well  be worth considering referring him to a GI physician to screen him for  other complications of reflux, such as Barrett's or stricture at some  point, but I will defer this to Dr. Luanna Villarreal. [name]'s capable hands, and  see him back here on a p.r.n. basis.     Charles Villarreal. Charles Sires, MD, [facility]  Electronically Signed    MBW/MedQ  DD: 02/02/2007   DT: 02/02/2007  Job #: [id number]   cc:   Charles Villarreal. Charles Villarreal, M.D.

## 2011-01-13 NOTE — H&P (Signed)
NAME:  Charles Villarreal, Charles Villarreal                           ACCOUNT NO.:  [id number]   MEDICAL RECORD NO.:  [id number]                   PATIENT TYPE:  EMS   LOCATION:  [facility]                                 FACILITY:  [facility]   PHYSICIAN:  Carole Binning, M.D. [facility]         DATE OF BIRTH:  1965/09/29   DATE OF ADMISSION:  05/11/2003  DATE OF DISCHARGE:                                HISTORY & PHYSICAL   PRIMARY CARE PHYSICIAN:  Luanna Cole. Lenord Fellers, M.D.   CHIEF COMPLAINT:  Chest pain.   HISTORY OF PRESENT ILLNESS:  The patient is a 45 year old male without prior  cardiac history. He has no known cardiac risk factors.  He awoke this  morning at approximately 3 a.m. and noted the onset of substernal chest  pain. It was described as a mild pressure or aching discomfort which was  graded at 3/10 in severity. There was no radiation of the pain and no  associated nausea, dyspnea, lightheadedness, or diaphoresis.  His pain  persisted throughout the day. This morning he got up and walked his dog and  dig not notice any change in the pain other than its persistent nature.  Because it continued in this pattern, he presented to the emergency room.  In the emergency room, he has complained of pain for several hours.  He has  had two normal EKG's and three cardiac marker screens which have been within  normal limits.  However, he continues to experience mild ongoing chest  discomfort. He denies any history of exertional chest pain. He denies any  exertional dyspnea, orthopnea, PND, or edema.  His chest pain does seem to  worsen with inspiration.   PAST MEDICAL HISTORY:  Social history, family history, medications, and  review of systems are as documented in the admission note.   PHYSICAL EXAMINATION:  GENERAL:  This is a well-appearing male in no acute  distress.  VITAL SIGNS:  Temperature 98, pulse 70 and regular, respirations 16, blood  pressure 150/94.  Oxygen saturation on room air is 98%.  SKIN: Warm and  dry without generalized rash.  HEENT:  Normocephalic and atraumatic.  Sclerae anicteric. Oral mucosa  unremarkable.  NECK:  No adenopathy or thyromegaly. No JVD. Carotid upstroke normal without  bruit.  CHEST:  Clear to auscultation and percussion.  HEART:  Regular rate and rhythm.  Normal S1 and S2.  No murmurs, rubs, or  gallops.  ABDOMEN:  Soft and nontender without organomegaly.  Normal bowel sounds with  no bruits.  EXTREMITIES:  No cyanosis, clubbing, or edema.  Peripheral pulses are 2+  throughout.   EKG reveals normal sinus rhythm with a rate of 67 and normal EKG.   LABORATORY DATA:  As noted in the admission note.  Cardiac markers have been  normal x3.   ASSESSMENT:  The patient is a 46 year old male without cardiac risk factors.  He presents with prolonged episode of substernal  chest pain which has not  been relieved with nitroglycerin or GI cocktail given in the emergency room.  Cardiac markers and EKG's have been normal.   PLAN:  Because of the persistent nature of the chest pain, the patient will  be admitted to rule out myocardial infarction. We will start the patient on  aspirin, nitrates, and proton pump inhibitor.  If she remains stable and  rules  out for myocardial infarction, we will likely discharge the patient in the  morning and schedule an outpatient Cardiolite.  However, if he does have any  evidence of EKG changes or cardiac enzyme abnormalities, then cardiac  catheterization will be indicated.                                                Carole Binning, M.D. [facility]    MWP/MEDQ  D:  05/11/2003  T:  05/11/2003  Job:  [id number]   cc:   Luanna Cole. Lenord Fellers, M.D.  [address] [place]  [place]  [place] [address]  Fax: [phone]

## 2011-01-13 NOTE — Op Note (Signed)
[facility]  Patient:    JORON, VELIS Visit Number: [id number] MRN: [id number]          Service Type: DSU Location: [facility] Attending Physician:  Arlis Porta Dictated by:   Adolph Pollack, M.D. Proc. Date: 11/05/01 Admit Date:  11/05/2001 Discharge Date: 11/05/2001                             Operative Report  SITE OF OPERATION:  Day Surgery  PREOPERATIVE DIAGNOSIS:  Umbilical hernia.  POSTOPERATIVE DIAGNOSIS:  Umbilical hernia.  OPERATIVE PROCEDURE:  Umbilical hernia repair with mesh.  SURGEON:  Adolph Pollack, M.D.  ANESTHESIA:  General.  INDICATIONS:  The patient is a 45 year old man who has had an umbilical bulge becoming more painful. He has an obvious umbilical hernia on exam and now presents for repair.  DESCRIPTION OF PROCEDURE:  The patient was taken to the operating room, placed supine on the operating room table, an a general anesthetic was administered. The periumbilical area was shaved and sterilely prepped and draped. Local anesthetic was infiltrated around the periumbilical area and a transverse subumbilical incision was made through the skin and subcutaneous tissue down to the level of the fascia. The fascial defect was palpated just to the left of the umbilicus. The umbilicus was amputated. The sac was excised and omentum was reduced back into the peritoneal cavity.  The fascial defect was then closed primarily with interrupted #0 Surgilon sutures. A piece of onlay polypropylene mesh was then placed over the primary closure and anchored to the fascia 2-3 cm away from the primary closure with interrupted Surgilon sutures. This then allowed for more than adequate coverage of the defect.  The wound was irrigated and hemostasis was adequate. The umbilicus was re-implanted to the mesh with a single 3-0 Vicryl suture. The subcutaneous tissue was then closed over the mesh with a running 3-0 Vicryl suture.  The skin was closed with a 4-0 Monocryl subcutaneous stitch, followed by Steri-Strips and a sterile dressing.  He tolerated the procedure well without any apparent complications and was taken to the recovery room in satisfactory condition. Dictated by:   Adolph Pollack, M.D. Attending Physician:  Arlis Porta DD:  11/07/01 TD:  11/08/01 Job: [id number]

## 2011-01-13 NOTE — Op Note (Signed)
NAME:  Charles Villarreal, Charles Villarreal                           ACCOUNT NO.:  [id number]   MEDICAL RECORD NO.:  [id number]                   PATIENT TYPE:  AMB   LOCATION:  CARD                                 FACILITY:  [facility]   PHYSICIAN:  Casimiro Needle B. Sherene Sires, M.D. [facility]           DATE OF BIRTH:  1966/07/17   DATE OF PROCEDURE:  05/19/2003  DATE OF DISCHARGE:                                 OPERATIVE REPORT   PROCEDURE:  Fiberoptic bronchoscopy with transbronchial biopsy of the left  lower lobe.   HISTORY AND INDICATIONS:  Please see dictated H&P yesterday for a  compressive pulmonary consultation regarding this 45 year old white male  former smoker with unexplained adenopathy consistent with sarcoid but also  with lymphoma and metastatic carcinoma in the differential.   DESCRIPTION OF PROCEDURE:  The procedure was performed in the bronchoscopy  suite with continuous monitoring by surface EKG and oximetry.   The patient was given a total of 25 mg of IV Demerol and 5 mg of IV Versed  for adequate sedation and cough suppression.   The right nares was easily cannulated using a standard flexible fiberoptic  bronchoscope after applying 1% lidocaine by updraft nebulizer.  There was  excellent visualization of the operating room and larynx with no  abnormalities noted.   Using additional 1% lidocaine __________, the entire tracheobronchial tree  was explored bilaterally with the following findings:   The trachea, carina and all the major airways opened widely to the  subsegmental level.  There were no focal endobronchial lesions.  Specifically, there was no airway widening nor any endobronchial  abnormalities to suggest cobblestoning or involvement with sarcoid or any  other process.   Therefore, using a wedge position within several basal segments of the left  lower lobe, multiple blind biopsies were obtained for random tissue  sampling of the left lower lobe.  Approximately four to six adequate  specimens were obtained.  He tolerated this well and additionally the  lingula was lavaged for cytology, AFB and fungal staining and culture to be  complete.   FOLLOW UP:  Chest x-ray is pending at the time of this dictation.  I will  contact the patient once I have the above studies completed to decide the  next step.  Based on the amount of tissue we obtained, if sarcoid is not  present on  the biopsies, the option is either to watch and wait regarding the  adenopathy or proceed with a mediastinoscopy.  He and his family strongly  want to proceed to a specific diagnosis and so mediastinoscopy would be the  next logical step.                                                Charlaine Dalton. Sherene Sires, M.D. [facility]  MBW/MEDQ  D:  05/19/2003  T:  05/19/2003  Job:  [id number]   cc:   Luanna Cole. Lenord Fellers, M.D.  [address], [place]  [place]  [place] [address]  Fax: [phone]

## 2011-01-13 NOTE — Discharge Summary (Signed)
NAME:  Charles Villarreal, Charles Villarreal                           ACCOUNT NO.:  [id number]   MEDICAL RECORD NO.:  [id number]                   PATIENT TYPE:  INP   LOCATION:  4714                                 FACILITY:  [facility]   PHYSICIAN:  Carole Binning, M.D. [facility]         DATE OF BIRTH:  09-13-1965   DATE OF ADMISSION:  05/11/2003  DATE OF DISCHARGE:  05/12/2003                           DISCHARGE SUMMARY - REFERRING   PROCEDURE:  None.   REASON FOR ADMISSION:  Please refer to dictated admission note.   LABORATORY DATA:  Serial cardiac markers normal.  Sodium 138, potassium 4.0  on admission (3.3 at discharge), glucose 93 on admission, BUN 13, creatinine  0.8.  Normal liver enzymes.  Hemoglobin 16, hematocrit 47.  Admission chest  x-ray:  Hilar/mediastinal adenopathy, question sarcoidosis.   HOSPITAL COURSE:  Following presentation to the [facility] Emergency Room  with complaint of a new mid sternal chest discomfort described as dull and  nonradiating, patient was admitted for overnight observation and further  monitoring with serial cardiac markers.  Of note, treatment with one  nitroglycerin tablet in the emergency room did not produce any relief.  The  patient did, however, note some improvement with a GI cocktail.   The patient was placed on low dose intravenous nitroglycerin and the  following morning reported complete resolution of his chest discomfort at  some point late the previous evening.  He had no recurrent chest pain and  the pain had been persistent since onset at 3:00 the previous morning.   Given the normal serial cardiac markers, plan by Carole Binning, M.D. [facility]  was to proceed with early outpatient exercise stress Cardiolite testing.  Arrangements were made for stress testing on Wednesday, September 15, 12:30  p.m.  The patient did have mild (3.3) hypokalemia noted prior to discharge  and received potassium supplementation.   DISCHARGE MEDICATIONS:  1. Aspirin mg  daily.  2. Protonix 40 mg daily.   DISCHARGE INSTRUCTIONS:  Proceed with exercise stress Cardiolite on  Wednesday, September 15, 12:15 p.m.  The patient is to refrain from eating  or drinking after midnight the previous evening.  The patient will return to  his primary care physician, Luanna Cole. [name], M.D. in the following two weeks  for further evaluation of abnormal chest x-ray.   DISCHARGE DIAGNOSES:  1. Nonexertional chest pain.     a. Normal serial cardiac markers.     b.        Outpatient exercise stress Cardiolite scheduled for Wednesday, September         15.  2. Mild hypokalemia.  3. Abnormal chest x-ray.      Gene Serpe, P.A. [facility]                      Carole Binning, M.D. [facility]    GS/MEDQ  D:  05/12/2003  T:  05/12/2003  Job:  [id number]   cc:   Luanna Cole. Lenord Fellers, M.D.  [address] [place]  [place]  [place] [address]  Fax: [phone]

## 2011-03-06 ENCOUNTER — Encounter: Payer: Self-pay | Admitting: Internal Medicine

## 2011-03-06 ENCOUNTER — Ambulatory Visit (INDEPENDENT_AMBULATORY_CARE_PROVIDER_SITE_OTHER): Payer: 59 | Admitting: Internal Medicine

## 2011-03-06 VITALS — BP 128/76 | HR 88 | Temp 98.9°F | Ht 71.0 in | Wt 209.0 lb

## 2011-03-06 DIAGNOSIS — B9789 Other viral agents as the cause of diseases classified elsewhere: Secondary | ICD-10-CM

## 2011-03-06 DIAGNOSIS — R509 Fever, unspecified: Secondary | ICD-10-CM

## 2011-03-06 DIAGNOSIS — T148XXA Other injury of unspecified body region, initial encounter: Secondary | ICD-10-CM

## 2011-03-06 DIAGNOSIS — Z0000 Encounter for general adult medical examination without abnormal findings: Secondary | ICD-10-CM

## 2011-03-06 DIAGNOSIS — B349 Viral infection, unspecified: Secondary | ICD-10-CM

## 2011-03-06 DIAGNOSIS — W57XXXA Bitten or stung by nonvenomous insect and other nonvenomous arthropods, initial encounter: Secondary | ICD-10-CM

## 2011-03-06 DIAGNOSIS — R6883 Chills (without fever): Secondary | ICD-10-CM

## 2011-03-06 LAB — POCT URINALYSIS DIPSTICK
Ketones, UA: NEGATIVE
Protein, UA: NEGATIVE
Spec Grav, UA: 1.01
Urobilinogen, UA: NEGATIVE
pH, UA: 5

## 2011-03-06 LAB — CBC WITH DIFFERENTIAL/PLATELET
HCT: 45.6 % (ref 39.0–52.0)
Hemoglobin: 16.3 g/dL (ref 13.0–17.0)
Lymphs Abs: 0.8 10*3/uL (ref 0.7–4.0)
MCH: 33.7 pg (ref 26.0–34.0)
MCHC: 35.8 g/dL (ref 30.0–36.0)
Monocytes Absolute: 0.5 10*3/uL (ref 0.1–1.0)
Monocytes Relative: 12 % (ref 3–12)
Neutro Abs: 2.6 10*3/uL (ref 1.7–7.7)
Neutrophils Relative %: 68 % (ref 43–77)
RBC: 4.83 MIL/uL (ref 4.22–5.81)

## 2011-03-06 LAB — GLUCOSE, POCT (MANUAL RESULT ENTRY): POC Glucose: 86

## 2011-03-08 ENCOUNTER — Encounter: Payer: Self-pay | Admitting: Internal Medicine

## 2011-03-08 ENCOUNTER — Other Ambulatory Visit: Payer: Self-pay | Admitting: Internal Medicine

## 2011-03-08 ENCOUNTER — Other Ambulatory Visit: Payer: 59 | Admitting: Internal Medicine

## 2011-03-08 LAB — COMPREHENSIVE METABOLIC PANEL
BUN: 16 mg/dL (ref 6–23)
CO2: 28 mEq/L (ref 19–32)
Calcium: 9.6 mg/dL (ref 8.4–10.5)
Chloride: 98 mEq/L (ref 96–112)
Creat: 0.91 mg/dL (ref 0.50–1.35)
Glucose, Bld: 88 mg/dL (ref 70–99)
Total Bilirubin: 0.7 mg/dL (ref 0.3–1.2)

## 2011-03-08 NOTE — Patient Instructions (Signed)
Advise clear liquids until diarrhea resolves for 24 hours. Advance diet slowly. Take Tylenol sparingly for fever and/or headache--- please call tomorrow with progress report

## 2011-03-08 NOTE — Progress Notes (Addendum)
  Subjective:    Patient ID: Charles Villarreal, male    DOB: 06/13/1966, 45 y.o.   MRN: [id number]  HPI patient went out for an anniversary dinner with his family at [facility] on 03/04/2011. He ate steak, mashed potatoes, macaroni and cheese, asparagus, desert with cream topping. Everyone ate about the same thing. Around 3 AM he awakened with a headache, nausea, abdominal discomfort, and subsequently had diarrhea. Has had fever and chills. No one else at home is sick. Patient says he feels weak and washed out. He is tremulous in the office today. Says he drank one glass of wine with dinner at a restaurant. Was concerned because he continued to feel poorly today. 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 History of sarcoidosis followed by Dr. Francella Solian. No recent travel history. Main complaint is headache and malaise.     Review of Systems     Objective:   Physical Exam HEENT exam: TMs and pharynx are clear; neck is supple without adenopathy; chest is clear; cardiac exam regular rate and rhythm; abdomen: no hepatosplenomegaly masses or tenderness. Skin: no rash.          Assessment & Plan:  Stat CBC shows white blood cell count of 3800. He could have an  viral syndrome but also must consider tick borne illness. Interesting that symptoms started in the middle of the night after eating at a restaurant but no one else who ate almost the same thing became ill. He has   been in the public a lot with multiple funerals recently so he could of had some viral exposure. Comprehensive metabolic panel, RMSF titer and Lyme titer pending. For now we'll treat conservatively. Recommend clear liquids until diarrhea resolves and advancing diet slowly. Use of Tylenol sparingly for headache. He is to call tomorrow with progress report.  Addendum 03/08/11: Comprehensive metabolic panel results are not back. [facility] lab apparently has lost his blood sample and it was re- collected  today. Lyme and RMSF titers reordered. Patient tells nurse he's feeling some better but still has headache.  03/10/2011: Spoke with patient yesterday and today by telephone. He feels about the same. Has a headache which goes away with Tylenol. [place] Spotted Fever titers are negative. Lyme titer is negative. Says if he does a little bit of activity he breaks out into sweats. We're going to go ahead and treat him with doxycycline 100 mg #20 one by mouth twice daily for 10 days. He still could have a viral syndrome and that is slow to resolve. Able to be at work. He will followup in 2 weeks with office visit and repeat lab work. Liver functions are mildly elevated and his CBC showed a white blood cell count 3800.

## 2011-03-09 ENCOUNTER — Telehealth: Payer: Self-pay | Admitting: Internal Medicine

## 2011-03-09 ENCOUNTER — Other Ambulatory Visit: Payer: 59 | Admitting: Internal Medicine

## 2011-03-09 LAB — ROCKY MTN SPOTTED FVR AB, IGM-BLOOD: ROCKY MTN SPOTTED FEVER, IGM: 0.2 IV

## 2011-03-09 LAB — B. BURGDORFI ANTIBODIES: B burgdorferi Ab IgG+IgM: 0.17 {ISR}

## 2011-03-09 LAB — ROCKY MTN SPOTTED FVR ABS PNL(IGG+IGM)
RMSF IgG: 0.4 IV
RMSF IgM: 0.2 IV

## 2011-03-09 NOTE — Telephone Encounter (Signed)
Called TM for updating progress report. Has elevation mild of the liver functions. Took all Tramadol for 4 days last week for cough associated with sarcoidosis as prescribed by Dr. Sherene Sires. He is feeling some better. Still has sweats and slight headache. Told him at this point I was favoring her viral infection. Apparently Dr. Sherene Sires treated him for tickborne illness with doxycycline sometime around Mother's Day. He is not aware of a tick bite since that time but he has tic exposure frequently [place] his [place]. Told him we needed to repeat CBC and liver functions in 2 weeks. We will call him with tickborne illness titers when received from lab. Apologized for [facility] losing tube of blood containing these titers obtained originally on 03/06/2011. Blood was recollected yesterday for these tests.

## 2011-03-24 ENCOUNTER — Other Ambulatory Visit: Payer: 59 | Admitting: Internal Medicine

## 2011-03-24 DIAGNOSIS — R6889 Other general symptoms and signs: Secondary | ICD-10-CM

## 2011-03-24 LAB — CBC WITH DIFFERENTIAL/PLATELET
Basophils Absolute: 0 10*3/uL (ref 0.0–0.1)
Basophils Relative: 1 % (ref 0–1)
HCT: 42.6 % (ref 39.0–52.0)
Lymphocytes Relative: 23 % (ref 12–46)
MCHC: 31.5 g/dL (ref 30.0–36.0)
Monocytes Absolute: 0.6 10*3/uL (ref 0.1–1.0)
Neutro Abs: 2.7 10*3/uL (ref 1.7–7.7)
Neutrophils Relative %: 60 % (ref 43–77)
Platelets: 349 10*3/uL (ref 150–400)
RDW: 13.9 % (ref 11.5–15.5)
WBC: 4.4 10*3/uL (ref 4.0–10.5)

## 2011-03-24 LAB — HEPATIC FUNCTION PANEL
ALT: 36 U/L (ref 0–53)
Bilirubin, Direct: 0.1 mg/dL (ref 0.0–0.3)
Total Protein: 6.5 g/dL (ref 6.0–8.3)

## 2011-03-25 ENCOUNTER — Encounter: Payer: Self-pay | Admitting: Internal Medicine

## 2011-04-14 ENCOUNTER — Other Ambulatory Visit: Payer: Self-pay | Admitting: Internal Medicine

## 2011-04-14 MED ORDER — ESOMEPRAZOLE MAGNESIUM 40 MG PO CPDR: 40.0000 mg | DELAYED_RELEASE_CAPSULE | Freq: Every day | ORAL | Status: DC

## 2011-04-14 NOTE — Telephone Encounter (Signed)
received refill request from [facility] for nexium 40mg  30day supply.  Last seen 4.26.12 by MW, told to follow up every April.   Refills sent to pharmacy.

## 2011-05-02 ENCOUNTER — Telehealth: Payer: Self-pay | Admitting: Internal Medicine

## 2011-05-02 NOTE — Telephone Encounter (Signed)
Form received and was handed to [place] to place in MW look at.

## 2011-05-02 NOTE — Telephone Encounter (Signed)
PA initiated for Nexium 40 mg through [facility] at [phone]. Member ID # is [id number]. Case ID # is [id number]. Awaiting fax.

## 2011-05-02 NOTE — Telephone Encounter (Signed)
Ok to change to aciphex 20 mg Take 30-60 min before first meal of the day in place of nexium

## 2011-05-03 MED ORDER — RABEPRAZOLE SODIUM 20 MG PO TBEC: DELAYED_RELEASE_TABLET | ORAL | Status: DC

## 2011-05-03 NOTE — Telephone Encounter (Signed)
Spoke with pt and made aware of change. Rx sent. Carron Curie, CMA

## 2011-09-07 ENCOUNTER — Telehealth: Payer: Self-pay | Admitting: Internal Medicine

## 2011-09-07 NOTE — Telephone Encounter (Signed)
Called and spoke with pt.  Onset 4 days.  C/o chills, body aches, sinus congestion and coughing up yellow colored sputum. No appt avail with MW. Offered appt with TP tomorrow. Pt agreed to be seen.  Scheduled with TP tomorrow at 2 pm.

## 2011-09-08 ENCOUNTER — Ambulatory Visit (INDEPENDENT_AMBULATORY_CARE_PROVIDER_SITE_OTHER): Payer: 59 | Admitting: Adult Health

## 2011-09-08 ENCOUNTER — Encounter: Payer: Self-pay | Admitting: Adult Health

## 2011-09-08 DIAGNOSIS — J069 Acute upper respiratory infection, unspecified: Secondary | ICD-10-CM

## 2011-09-08 MED ORDER — AZITHROMYCIN 250 MG PO TABS: ORAL_TABLET | ORAL | Status: AC

## 2011-09-08 MED ORDER — PREDNISONE 10 MG PO TABS: ORAL_TABLET | ORAL | Status: DC

## 2011-09-08 NOTE — Assessment & Plan Note (Addendum)
Acute Flare ? Sarcoid flare   Plan;  Zpack take as directed  Mucinex DM Twice daily  As needed  Cough/congestion  Prednisone taper over next week.  Fluids and rest  Tylenol As needed   Please contact office for sooner follow up if symptoms do not improve or worsen or seek emergency care  Follow up Dr. Sherene Sires  In 3 weeks

## 2011-09-08 NOTE — Progress Notes (Signed)
Subjective:    Patient ID: Charles Villarreal, male    DOB: 1966/03/15, 46 y.o.   MRN: [id number]  HPI  Primary:  [name]   44-yowm with minimal smoking hx , dx  of sarcoid (Positive transbronchial biopsy 05/19/03)  With  asymptomatic hilar adenopathy. Hx of atypical chest pain that we treated with Protonix after a negative cardiac workup  was completed on May 13, 2003.  Did not require chronic steroids  Jan 18, 2010 --ov/ cc  sarcod flare w/ dry cough, sweats, fatigue x6days. Had been doing well for last several months until the past week. Cc dry cough, fatigue for 1 week." like his flares w/ sarcoid in past". Prednisone 10mg  2 tabs once daily for 2 weeks, then 1 tab once daily for 1 week and stop  Delsym and tramadol as needed for cough.   February 22, 2010 ov c/o night sweats  less frequent and less severe.  cough resolved. Breathing better. No complaints today. not needing tramadol at all but still didn't feel quite right even on 20 mg prednisone per day so assumed this was not sarcoid and tapered   Off pred  without change in symptoms. ace/ lfts/calcium and cxr normal, bp up , rec no salt and observe off prednisone    12/22/2010 ov / Wert  Cc freq colds assoc with cough, fatigue and sob. Tick bite one week prior to ov, embedded but not engorged.  Minimal dry cough, no rash or arthralgias.   >>cough changes   09/08/2011 Acute OV  Complains of sinus pressure congestion/prod cough with green mucus, body aches, chills, sore throat x4days. No fever.  OTC not helping that much.  No recent travel or abx use.  Doing well until last 1 week.  Does have rash along back for 4 months -non puritic       Past Medical History:  INSOMNIA (ICD-780.52)  PULMONARY SARCOIDOSIS (ICD-135) .........................Marland KitchenWert  - Positive transbronchial biopsy 05/19/03  - Opth eval done each Fall > no sarcoid (Dr Lindell Spar office)  Health Maintenance.........................................................Eden Emms  [name]  - Pneumovax April 05, 2010 ( age 5, booster needed age 89)            Review of Systems Constitutional:   No  weight loss, night sweats,  Fevers,  +chills,  +fatigue, or  lassitude.  HEENT:   No headaches,  Difficulty swallowing,  Tooth/dental problems, or  Sore throat,                No sneezing, itching, ear ache,  =nasal congestion, post nasal drip,   CV:  No chest pain,  Orthopnea, PND, swelling in lower extremities, anasarca, dizziness, palpitations, syncope.   GI  No heartburn, indigestion, abdominal pain, nausea, vomiting, diarrhea, change in bowel habits, loss of appetite, bloody stools.   Resp:   No coughing up of blood.    No chest wall deformity  Skin+ rash or lesions.  GU: no dysuria, change in color of urine, no urgency or frequency.  No flank pain, no hematuria   MS:  No joint pain or swelling.  No decreased range of motion.  No back pain.  Psych:  No change in mood or affect. No depression or anxiety.  No memory loss.          Objective:   Physical Exam     wt 206 July 27, 2009 > 213 February 22, 2010 > 215 April 05, 2010 > 219 12/22/2010 >211 09/08/2011  Ambulatory healthy appearing in  no acute distress  HEENT: nl dentition, turbinates, and orophanx. Nl external ear canals without cough reflex  Neck without JVD/Nodes/TM  Lungs clear to A and P bilaterally without cough on insp or exp maneuvers  RRR no s3 or murmur or increase in P2  Abd soft and benign with nl excursion in the supine position. No bruits or organomegaly  Ext warm without calf tenderness, cyanosis clubbing or edema  Skin : along post back with scattered macules -hyperpigmented areas   RAD /Lab review:    12/22/10 CHEST - 2 VIEW  Comparison: 02/22/2010.  Findings: Trachea is midline. Heart size normal. Lungs are clear.  No pleural fluid.  IMPRESSION:  No acute findings  Labs nl including esr/ lft's and calcium.    Assessment & Plan:

## 2011-09-08 NOTE — Patient Instructions (Signed)
Zpack take as directed  Mucinex DM Twice daily  As needed  Cough/congestion  Prednisone taper over next week.  Fluids and rest  Tylenol As needed   Please contact office for sooner follow up if symptoms do not improve or worsen or seek emergency care  Follow up Dr. Sherene Sires  In 3 weeks

## 2011-10-05 ENCOUNTER — Encounter: Payer: Self-pay | Admitting: Internal Medicine

## 2011-10-05 ENCOUNTER — Ambulatory Visit (INDEPENDENT_AMBULATORY_CARE_PROVIDER_SITE_OTHER): Payer: 59 | Admitting: Internal Medicine

## 2011-10-05 DIAGNOSIS — J310 Chronic rhinitis: Secondary | ICD-10-CM

## 2011-10-05 DIAGNOSIS — D869 Sarcoidosis, unspecified: Secondary | ICD-10-CM

## 2011-10-05 MED ORDER — KETOCONAZOLE 2 % EX SHAM: MEDICATED_SHAMPOO | CUTANEOUS | Status: AC

## 2011-10-05 MED ORDER — PREDNISONE (PAK) 10 MG PO TABS: ORAL_TABLET | ORAL | Status: AC

## 2011-10-05 MED ORDER — MOMETASONE FUROATE 50 MCG/ACT NA SUSP: NASAL | Status: DC

## 2011-10-05 NOTE — Progress Notes (Signed)
Subjective:    Patient ID: Charles Villarreal, male    DOB: 1965-09-18    MRN: [id number]  HPI  Primary:  Charles Villarreal   45 yowm with minimal smoking hx , dx  of sarcoid (Positive transbronchial biopsy 05/19/03)  With  asymptomatic hilar adenopathy. Hx of atypical chest pain that we treated with Protonix after a negative cardiac workup  was completed on May 13, 2003.  Did not require chronic steroids.  Jan 18, 2010 --ov/ cc  sarcod flare w/ dry cough, sweats, fatigue x6days. Had been doing well for last several months until the past week. Cc dry cough, fatigue for 1 week." like his flares w/ sarcoid in past". Prednisone 10mg  2 tabs once daily for 2 weeks, then 1 tab once daily for 1 week and stop  Delsym and tramadol as needed for cough.   February 22, 2010 ov c/o night sweats  less frequent and less severe.  cough resolved. Breathing better. No complaints today. not needing tramadol at all but still didn't feel quite right even on 20 mg prednisone per day so assumed this was not sarcoid and tapered   Off pred  without change in symptoms. ace/ lfts/calcium and cxr normal, bp up , rec no salt and observe off prednisone    12/22/2010 ov / Charles Villarreal  Cc freq colds assoc with cough, fatigue and sob. Tick bite one week prior to ov, embedded but not engorged.  Minimal dry cough, no rash or arthralgias.   >>cough changes   09/08/2011 Acute OV  Complains of sinus pressure congestion/prod cough with green mucus, body aches, chills, sore throat x4days. No fever.  OTC not helping that much.  No recent travel or abx use.  Doing well until last 1 week.  Does have rash along back for 4 months -non puritic  rec Zpack take as directed  Mucinex DM Twice daily  As needed  Cough/congestion  Prednisone taper over next week.  Fluids and rest  Tylenol As needed    10/05/2011 f/u ov/Charles Villarreal cc nasal congestion/ drainage clear, was green, no body aches, chills, sore throat. Minimal cough, no sob, no rash or ocular  complaints  Sleeping ok without nocturnal  or early am exacerbation  of respiratory  c/o's or need for noct saba. Also denies any obvious fluctuation of symptoms with weather or environmental changes or other aggravating or alleviating factors except as outlined above   ROS  At present neg for  any significant sore throat, dysphagia, itching, sneezing,  nasal congestion or excess/ purulent secretions,  fever, chills, sweats, unintended wt loss, pleuritic or exertional cp, hempoptysis, orthopnea pnd or leg swelling.  Also denies presyncope, palpitations, heartburn, abdominal pain, nausea, vomiting, diarrhea  or change in bowel or urinary habits, dysuria,hematuria,  rash, arthralgias, visual complaints, headache, numbness weakness or ataxia.         Past Medical History:  INSOMNIA (ICD-780.52)  PULMONARY SARCOIDOSIS (ICD-135) .........................Marland KitchenWert  - Positive transbronchial biopsy 05/19/03  - Opth eval done each Fall > no sarcoid (Dr Charles Villarreal office)  Health Maintenance.........................................................Charles Villarreal  - Pneumovax April 05, 2010 ( age 75, booster needed age 74)                     Objective:   Physical Exam     wt 206 Jul 27, 2009 > 213 February 22, 2010 > 219 12/22/2010 >211 09/08/2011 > 10/05/2011  216 Ambulatory healthy appearing in no acute distress  HEENT: nl dentition, turbinates, and  orophanx. Nl external ear canals without cough reflex  Neck without JVD/Nodes/TM  Lungs clear to A and P bilaterally without cough on insp or exp maneuvers  RRR no s3 or murmur or increase in P2  Abd soft and benign with nl excursion in the supine position. No bruits or organomegaly  Ext warm without calf tenderness, cyanosis clubbing or edema  Skin : along post back with scattered macules -hyperpigmented areas         Assessment & Plan:

## 2011-10-05 NOTE — Patient Instructions (Signed)
Prednisone 10 mg take  4 each am x 2 days,   2 each am x 2 days,  1 each am x2days and stop   Nasal steroids have no immediate benefit in terms of improving symptoms.  To help them reached the target tissue, the patient should use Afrin two puffs every 12 hours applied one min before using the nasal steroids.  Afrin should be stopped after no more than 5 days.  If the symptoms worsen, Afrin can be restarted after 5 days off of therapy to prevent rebound congestion from overuse of Afrin.  I also emphasized that in no way are nasal steroids a concern in terms of "addiction".   If no better next step is sinus ct- call Libby at [phone] to schedule

## 2011-10-08 DIAGNOSIS — J310 Chronic rhinitis: Secondary | ICD-10-CM | POA: Insufficient documentation

## 2011-10-08 NOTE — Assessment & Plan Note (Signed)
No evidence of active dz, no need for chronic rx

## 2011-10-08 NOTE — Assessment & Plan Note (Addendum)
Flare with uri  I emphasized that nasal steroids have no immediate benefit in terms of improving symptoms.  To help them reached the target tissue, the patient should use Afrin two puffs every 12 hours applied one min before using the nasal steroids.  Afrin should be stopped after no more than 5 days.  If the symptoms worsen, Afrin can be restarted after 5 days off of therapy to prevent rebound congestion from overuse of Afrin.  I also emphasized that in no way are nasal steroids a concern in terms of "addiction".   See instructions for specific recommendations which were reviewed directly with the patient who was given a copy with highlighter outlining the key components.  Sinus ct next step if not improving

## 2011-11-17 ENCOUNTER — Encounter: Payer: Self-pay | Admitting: Adult Health

## 2011-11-17 ENCOUNTER — Ambulatory Visit (INDEPENDENT_AMBULATORY_CARE_PROVIDER_SITE_OTHER): Payer: 59 | Admitting: Adult Health

## 2011-11-17 VITALS — BP 142/84 | HR 70 | Temp 98.1°F | Ht 73.0 in | Wt 211.2 lb

## 2011-11-17 DIAGNOSIS — J069 Acute upper respiratory infection, unspecified: Secondary | ICD-10-CM

## 2011-11-17 MED ORDER — AZITHROMYCIN 250 MG PO TABS: ORAL_TABLET | ORAL | Status: AC

## 2011-11-17 MED ORDER — TRAMADOL HCL 50 MG PO TABS: 50.0000 mg | ORAL_TABLET | Freq: Four times a day (QID) | ORAL | Status: AC | PRN

## 2011-11-17 NOTE — Progress Notes (Signed)
Subjective:    Patient ID: Charles Villarreal, male    DOB: [birth year]    MRN: [id number]  HPI  Primary:  Charles Villarreal   45 yowm with minimal smoking hx , dx  of sarcoid (Positive transbronchial biopsy 05/19/03)  With  asymptomatic hilar adenopathy. Hx of atypical chest pain that we treated with Protonix after a negative cardiac workup  was completed on May 13, 2003.  Did not require chronic steroids.  Jan 18, 2010 --ov/ cc  sarcod flare w/ dry cough, sweats, fatigue x6days. Had been doing well for last several months until the past week. Cc dry cough, fatigue for 1 week." like his flares w/ sarcoid in past". Prednisone 10mg  2 tabs once daily for 2 weeks, then 1 tab once daily for 1 week and stop  Delsym and tramadol as needed for cough.   February 22, 2010 ov c/o night sweats  less frequent and less severe.  cough resolved. Breathing better. No complaints today. not needing tramadol at all but still didn't feel quite right even on 20 mg prednisone per day so assumed this was not sarcoid and tapered   Off pred  without change in symptoms. ace/ lfts/calcium and cxr normal, bp up , rec no salt and observe off prednisone    12/22/2010 ov / Charles Villarreal  Cc freq colds assoc with cough, fatigue and sob. Tick bite one week prior to ov, embedded but not engorged.  Minimal dry cough, no rash or arthralgias.   >>cough changes   09/08/2011 Acute OV  Complains of sinus pressure congestion/prod cough with green mucus, body aches, chills, sore throat x4days. No fever.  OTC not helping that much.  No recent travel or abx use.  Doing well until last 1 week.  Does have rash along back for 4 months -non puritic  rec Zpack take as directed  Mucinex DM Twice daily  As needed  Cough/congestion  Prednisone taper over next week.  Fluids and rest  Tylenol As needed    10/05/2011 f/u ov/Charles Villarreal cc nasal congestion/ drainage clear, was green, no body aches, chills, sore throat. Minimal cough, no sob, no rash or ocular  complaints >pred taper     11/17/2011 Acute OV  Complains of sinus pressure/congestion, green/brown mucus, PND, prod cough x 3 days. OTC not working. Cough is starting to get worse. No fever or hemoptysis. No chest pain or rash.  Symptoms worse in am and late night.    Constitutional:   No  weight loss, night sweats,  Fevers, chills, fatigue, or  lassitude.  HEENT:   No headaches,  Difficulty swallowing,  Tooth/dental problems, or  Sore throat,                No sneezing, itching, ear ache,  +nasal congestion, post nasal drip,   CV:  No chest pain,  Orthopnea, PND, swelling in lower extremities, anasarca, dizziness, palpitations, syncope.   GI  No heartburn, indigestion, abdominal pain, nausea, vomiting, diarrhea, change in bowel habits, loss of appetite, bloody stools.   Resp:  No coughing up of blood.   No chest wall deformity  Skin: no rash or lesions.  GU: no dysuria, change in color of urine, no urgency or frequency.  No flank pain, no hematuria   MS:  No joint pain or swelling.  No decreased range of motion.  No back pain.  Psych:  No change in mood or affect. No depression or anxiety.  No memory loss.  Past Medical History:  INSOMNIA (ICD-780.52)  PULMONARY SARCOIDOSIS (ICD-135) .........................Marland KitchenWert  - Positive transbronchial biopsy 05/19/03  - Opth eval done each Fall > no sarcoid (Dr Lindell Spar office)  Health Maintenance.........................................................Charles Villarreal  - Pneumovax April 05, 2010 ( age 16, booster needed age 76)                     Objective:   Physical Exam     wt 206 Jul 27, 2009 > 213 February 22, 2010 > 219 12/22/2010 >211 09/08/2011 > 10/05/2011  216> 211 3/22  Ambulatory healthy appearing in no acute distress  HEENT: nl dentition, and orophanx. Nl external ear canals without cough reflex , clear nasal drainage  Neck without JVD/Nodes/TM  Lungs coarse BS w/ no wheezing  RRR no s3 or murmur  or increase in P2  Abd soft and benign with nl excursion in the supine position. No bruits or organomegaly  Ext warm without calf tenderness, cyanosis clubbing or edema  Skin : along post back with scattered macules -hyperpigmented areas         Assessment & Plan:

## 2011-11-17 NOTE — Patient Instructions (Signed)
Zpack take as directed  Mucinex DM Twice daily  As needed  Cough/congestion  Tramadol As needed  Cough .  Fluids and rest  Tylenol As needed   Please contact office for sooner follow up if symptoms do not improve or worsen or seek emergency care  Follow up Dr. Sherene Sires  In 4-5 months and As needed

## 2011-11-20 NOTE — Assessment & Plan Note (Signed)
Acute URI ? Early sinusitis   Plan Zpack take as directed  Mucinex DM Twice daily  As needed  Cough/congestion  Tramadol As needed  Cough .  Fluids and rest  Tylenol As needed   Please contact office for sooner follow up if symptoms do not improve or worsen or seek emergency care  Follow up Dr. Sherene Sires  In 4-5 months and As needed

## 2012-03-21 ENCOUNTER — Ambulatory Visit: Payer: 59 | Admitting: Internal Medicine

## 2012-05-14 ENCOUNTER — Ambulatory Visit: Payer: 59 | Admitting: Internal Medicine

## 2012-05-17 ENCOUNTER — Ambulatory Visit (INDEPENDENT_AMBULATORY_CARE_PROVIDER_SITE_OTHER): Payer: BC Managed Care – PPO | Admitting: Internal Medicine

## 2012-05-17 ENCOUNTER — Ambulatory Visit (INDEPENDENT_AMBULATORY_CARE_PROVIDER_SITE_OTHER)
Admission: RE | Admit: 2012-05-17 | Discharge: 2012-05-17 | Disposition: A | Discharge status: Home / Self Care | Payer: BC Managed Care – PPO | Source: Ambulatory Visit | Attending: Internal Medicine | Admitting: Internal Medicine

## 2012-05-17 ENCOUNTER — Encounter: Payer: Self-pay | Admitting: Internal Medicine

## 2012-05-17 ENCOUNTER — Other Ambulatory Visit (INDEPENDENT_AMBULATORY_CARE_PROVIDER_SITE_OTHER): Payer: BC Managed Care – PPO

## 2012-05-17 VITALS — BP 150/90 | HR 69 | Temp 98.1°F | Ht 73.0 in | Wt 224.0 lb

## 2012-05-17 DIAGNOSIS — D869 Sarcoidosis, unspecified: Secondary | ICD-10-CM

## 2012-05-17 DIAGNOSIS — I10 Essential (primary) hypertension: Secondary | ICD-10-CM

## 2012-05-17 DIAGNOSIS — R55 Syncope and collapse: Secondary | ICD-10-CM

## 2012-05-17 LAB — HEPATIC FUNCTION PANEL
ALT: 55 U/L — ABNORMAL HIGH (ref 0–53)
Alkaline Phosphatase: 50 U/L (ref 39–117)
Bilirubin, Direct: 0 mg/dL (ref 0.0–0.3)
Total Bilirubin: 1.1 mg/dL (ref 0.3–1.2)

## 2012-05-17 LAB — LIPID PANEL
Cholesterol: 295 mg/dL — ABNORMAL HIGH (ref 0–200)
HDL: 43.1 mg/dL (ref >39.00)
VLDL: 71 mg/dL — ABNORMAL HIGH (ref 0.0–40.0)

## 2012-05-17 LAB — CBC WITH DIFFERENTIAL/PLATELET
Basophils Absolute: 0 10*3/uL (ref 0.0–0.1)
Eosinophils Absolute: 0.3 10*3/uL (ref 0.0–0.7)
Eosinophils Relative: 4.4 % (ref 0.0–5.0)
MCV: 97.9 fl (ref 78.0–100.0)
Monocytes Absolute: 0.7 10*3/uL (ref 0.1–1.0)
Neutrophils Relative %: 62.6 % (ref 43.0–77.0)
Platelets: 233 10*3/uL (ref 150.0–400.0)
RDW: 13.9 % (ref 11.5–14.6)
WBC: 5.9 10*3/uL (ref 4.5–10.5)

## 2012-05-17 LAB — TSH: TSH: 0.54 u[IU]/mL (ref 0.35–5.50)

## 2012-05-17 LAB — BASIC METABOLIC PANEL
BUN: 13 mg/dL (ref 6–23)
Chloride: 103 mEq/L (ref 96–112)
Creatinine, Ser: 0.9 mg/dL (ref 0.4–1.5)

## 2012-05-17 LAB — LDL CHOLESTEROL, DIRECT: Direct LDL: 172.2 mg/dL

## 2012-05-17 NOTE — Patient Instructions (Signed)
Please remember to go to the lab and x-ray department downstairs for your tests - we will call you with the results when they are available.     Monitor your blood pressure weekly but especially if you are having a spell  See Dr Lenord Fellers for follow up within the next 4 weeks

## 2012-05-17 NOTE — Progress Notes (Signed)
Subjective:    Patient ID: Charles Villarreal, male    DOB: 1966-01-24    MRN: [id number]  HPI  Primary:  [name]   46 yowm with minimal smoking hx , dx  of sarcoid (Positive transbronchial biopsy 05/19/03)  With  asymptomatic hilar adenopathy. Hx of atypical chest pain that we treated with Protonix after a negative cardiac workup  was completed on May 13, 2003.  Did not require chronic steroids.  Jan 18, 2010 --ov/ cc  sarcod flare w/ dry cough, sweats, fatigue x6days. Had been doing well for last several months until the past week. Cc dry cough, fatigue for 1 week." like his flares w/ sarcoid in past". Prednisone 10mg  2 tabs once daily for 2 weeks, then 1 tab once daily for 1 week and stop  Delsym and tramadol as needed for cough.   February 22, 2010 ov c/o night sweats  less frequent and less severe.  cough resolved. Breathing better. No complaints today. not needing tramadol at all but still didn't feel quite right even on 20 mg prednisone per day so assumed this was not sarcoid and tapered   Off pred  without change in symptoms. ace/ lfts/calcium and cxr normal, bp up , rec no salt and observe off prednisone    12/22/2010 ov / Charles Villarreal  Cc freq colds assoc with cough, fatigue and sob. Tick bite one week prior to ov, embedded but not engorged.  Minimal dry cough, no rash or arthralgias.   >>cough changes   09/08/2011 Acute OV  Complains of sinus pressure congestion/prod cough with green mucus, body aches, chills, sore throat x4days. No fever.  OTC not helping that much.  No recent travel or abx use.  Doing well until last 1 week.  Does have rash along back for 4 months -non puritic  rec Zpack take as directed  Mucinex DM Twice daily  As needed  Cough/congestion  Prednisone taper over next week.  Fluids and rest  Tylenol As needed    10/05/2011 f/u ov/[name] cc nasal congestion/ drainage clear, was green, no body aches, chills, sore throat. Minimal cough, no sob, no rash or ocular  complaints >pred taper     11/17/2011 Acute OV  Complains of sinus pressure/congestion, green/brown mucus, PND, prod cough x 3 days. OTC not working. Cough is starting to get worse. No fever or hemoptysis. No chest pain or rash.  Symptoms worse in am and late night rec Zpack take as directed  Mucinex DM Twice daily  As needed  Cough/congestion  Tramadol As needed  Cough .  Fluids and rest  Tylenol As needed     05/17/2012 f/u ov/Charles Villarreal cc 2 spells where felt light headed first one April 2013 better p [employer], 2nd Apr 17 2012, feeling "shaky,light headedness and unsteady coordination" denies feeling flushed,cool or clammy 3wks ago that lasted about 1 hr, 2nd time this event has occurred--reports breathing is "good"--req flu shot. Since spells able to work out fine.  Both spells occurred after sitting watching sports, once in heat, once indoors. No assoc amaurosis, vertigo, aphasia or problem swallowing or localized weakness, ha or nausea or scotomata.    Sleeping ok without nocturnal  or early am exacerbation  of respiratory  c/o's or need for noct saba. Also denies any obvious fluctuation of symptoms with weather or environmental changes or other aggravating or alleviating factors except as outlined above   ROS  The following are not active complaints unless bolded sore throat, dysphagia, dental problems,  itching, sneezing,  nasal congestion or excess/ purulent secretions, ear ache,   fever, chills, sweats, unintended wt loss, pleuritic or exertional cp, hemoptysis,  orthopnea pnd or leg swelling, presyncope, palpitations, heartburn, abdominal pain, anorexia, nausea, vomiting, diarrhea  or change in bowel or urinary habits, change in stools or urine, dysuria,hematuria,  rash, arthralgias, visual complaints, headache, numbness weakness or ataxia or problems with walking or coordination,  change in mood/affect or memory.                      Past Medical History:  INSOMNIA  (ICD-780.52)  PULMONARY SARCOIDOSIS (ICD-135) .........................Marland KitchenWert  - Positive transbronchial biopsy 05/19/03  - Opth eval done each Fall > no sarcoid (Dr Lindell Spar office)  Health Maintenance.........................................................Lennox Dayal [name]  - Pneumovax April 05, 2010 ( age 46, booster needed age 63)         Objective:   Physical Exam     wt 206 Jul 27, 2009 > 213 February 22, 2010 > 219 12/22/2010 >211 09/08/2011 > 10/05/2011  216> 211 3/22  05/17/2012  224 Ambulatory healthy appearing in no acute distress  HEENT: nl dentition, and orophanx. Nl external ear canals without cough reflex , clear nasal drainage  Neck without JVD/Nodes/TM  Lungs coarse BS w/ no wheezing  RRR no s3 or murmur or increase in P2  Abd soft and benign with nl excursion in the supine position. No bruits or organomegaly  Ext warm without calf tenderness, cyanosis clubbing or edema  Skin : along post back with scattered macules -hyperpigmented areas    CXR  05/17/2012 :  Borderline cardiac silhouette enlargement. Stable mediastinal contours. Stable slight hilar prominence. No acute or active pulmonary disease is evident.   EKG and labs ok x LDL 171      Assessment & Plan:

## 2012-05-18 DIAGNOSIS — R55 Syncope and collapse: Secondary | ICD-10-CM | POA: Insufficient documentation

## 2012-05-18 NOTE — Assessment & Plan Note (Signed)
Not Adequate control on present rx, reviewed options, defer rx to Dr Lurlean Nanny Lenord Fellers

## 2012-05-18 NOTE — Assessment & Plan Note (Signed)
Spells sound like tia's but if so would be in posterior circulation > rec asa one daily and f/u with Dr Lenord Fellers

## 2012-05-18 NOTE — Assessment & Plan Note (Signed)
-   Positive transbronchial biopsy 05/19/03  - Opth eval done each Fall > no sarcoid (Dr Lindell Spar office)  - Cxr and labs nl 12/22/10 and 05/17/12  No evidence of active sarcoid systemically or any pulmonary, cardiac or neuro sequelae.  Spells do not appera to be related

## 2012-05-20 ENCOUNTER — Telehealth: Payer: Self-pay | Admitting: Internal Medicine

## 2012-05-20 NOTE — Telephone Encounter (Signed)
Spoke with pt and notified of results per Dr. Wert. Pt verbalized understanding and denied any questions. 

## 2012-05-20 NOTE — Progress Notes (Signed)
Quick Note:  Spoke with pt and notified of results per Dr. Wert. Pt verbalized understanding and denied any questions.  ______ 

## 2012-05-22 ENCOUNTER — Other Ambulatory Visit: Payer: Self-pay | Admitting: *Deleted

## 2012-05-22 NOTE — Telephone Encounter (Signed)
Received Rx request for patient for aciphex 20mg . According to chart-telephone on 05/02/2011 patients nexium was changed to aciphex.  However aciphex is not currently on patient med list,but nexium is. Dr. Sherene Sires can you clarify which med patient should be on so I can refill? Thank you

## 2012-05-24 MED ORDER — RABEPRAZOLE SODIUM 20 MG PO TBEC: 20.0000 mg | DELAYED_RELEASE_TABLET | Freq: Every day | ORAL | Status: DC

## 2012-05-24 NOTE — Telephone Encounter (Signed)
Refill aciphex and delete nexium from [facility]

## 2012-05-24 NOTE — Telephone Encounter (Signed)
rx has been sent to the pharmacy. 

## 2012-05-31 ENCOUNTER — Ambulatory Visit: Payer: 59 | Admitting: Internal Medicine

## 2012-06-07 ENCOUNTER — Encounter: Payer: Self-pay | Admitting: Internal Medicine

## 2012-06-07 ENCOUNTER — Ambulatory Visit (INDEPENDENT_AMBULATORY_CARE_PROVIDER_SITE_OTHER): Payer: BC Managed Care – PPO | Admitting: Internal Medicine

## 2012-06-07 VITALS — BP 132/88 | HR 76 | Temp 98.2°F | Ht 73.0 in | Wt 220.0 lb

## 2012-06-07 DIAGNOSIS — R42 Dizziness and giddiness: Secondary | ICD-10-CM

## 2012-06-07 DIAGNOSIS — Z23 Encounter for immunization: Secondary | ICD-10-CM

## 2012-06-07 DIAGNOSIS — H8110 Benign paroxysmal vertigo, unspecified ear: Secondary | ICD-10-CM

## 2012-06-07 DIAGNOSIS — G252 Other specified forms of tremor: Secondary | ICD-10-CM

## 2012-06-07 DIAGNOSIS — N529 Male erectile dysfunction, unspecified: Secondary | ICD-10-CM

## 2012-06-07 DIAGNOSIS — E785 Hyperlipidemia, unspecified: Secondary | ICD-10-CM

## 2012-06-07 DIAGNOSIS — G250 Essential tremor: Secondary | ICD-10-CM

## 2012-06-07 NOTE — Patient Instructions (Addendum)
Take Antivert 25 mg 3 times daily as needed for vertigo. Return next week for fasting lipid panel and hemoglobin A1c. We will schedule MRI of the brain with contrast for headaches and vertigo. We will schedule neurology consultation for tremor

## 2012-06-11 ENCOUNTER — Other Ambulatory Visit: Payer: BC Managed Care – PPO | Admitting: Internal Medicine

## 2012-06-11 DIAGNOSIS — Z13220 Encounter for screening for lipoid disorders: Secondary | ICD-10-CM

## 2012-06-11 DIAGNOSIS — R7301 Impaired fasting glucose: Secondary | ICD-10-CM

## 2012-06-11 LAB — LIPID PANEL
Cholesterol: 316 mg/dL — ABNORMAL HIGH (ref 0–200)
Total CHOL/HDL Ratio: 7.3 Ratio

## 2012-06-11 LAB — HEMOGLOBIN A1C: Mean Plasma Glucose: 103 mg/dL (ref <117)

## 2012-07-16 NOTE — Progress Notes (Signed)
  Subjective:    Patient ID: Charles Villarreal, male    DOB: 11/06/1965, 46 y.o.   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 Sherene Sires in today with complaint of vertigo. Patient has had 2 episodes of positional vertigo. He is also had some headache recently. Wife has noticed tremor in left hand.  A few years ago, patient had issues with excess alcohol consumption but  that has improved. He has a history of GE reflux and erectile dysfunction. History of hyperlipidemia. History of borderline blood pressure elevation. History of low back pain. Currently not on any statin medication.  No known drug allergies  He is married and has 3 daughters. Does not smoke. Wife is attorney. 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  Left knee surgery 1982, left knee surgery 1985. Appendectomy 1985.  Tetanus immunization 2004.      Review of Systems     Objective:   Physical Exam  Vitals reviewed. Constitutional: He is oriented to person, place, and time. He appears well-developed and well-nourished.  HENT:  Head: Normocephalic and atraumatic.  Right Ear: External ear normal.  Left Ear: External ear normal.  Mouth/Throat: No oropharyngeal exudate.  Eyes: Conjunctivae normal are normal. Pupils are equal, round, and reactive to light. Right eye exhibits no discharge. Left eye exhibits no discharge. No scleral icterus.       Discs are sharp and flat bilaterally  Neck: Neck supple. No JVD present. No thyromegaly present.  Cardiovascular: Normal rate, regular rhythm and normal heart sounds.   No murmur heard. Pulmonary/Chest: Effort normal and breath sounds normal. He has no wheezes. He has no rales.  Musculoskeletal: He exhibits no edema.  Lymphadenopathy:    He has no cervical adenopathy.  Neurological: He is alert and oriented to person, place, and time. He has normal reflexes. No cranial nerve  deficit. Coordination normal.       Tremor left hand. Possibly some increased rigidity upper extremities  Skin: Skin is warm and dry. He is not diaphoretic.  Psychiatric: He has a normal mood and affect. His behavior is normal. Thought content normal.          Assessment & Plan:  Benign positional vertigo  Essential tremor  History of sarcoidosis  History of hyperlipidemia  Borderline blood pressure elevation  Plan: Patient is concerned about recent bouts of vertigo. He's concerned about some stiffness in his upper chest remedies and tremor in his left hand. He will take if ever 25 mg 3 times daily for vertigo. We'll arrange for MRI of the brain with contrast. He'll return for fasting lab work next week. We'll arrange for neurology consultation.  Addendum: Patient has significant hyperlipidemia and needs to begin Lipitor with plans repeat lipid panel liver functions in 3 months

## 2012-07-17 ENCOUNTER — Other Ambulatory Visit: Payer: Self-pay

## 2012-07-17 MED ORDER — ATORVASTATIN CALCIUM 40 MG PO TABS: 40.0000 mg | ORAL_TABLET | Freq: Every day | ORAL | Status: DC

## 2012-08-04 ENCOUNTER — Encounter: Payer: Self-pay | Admitting: Internal Medicine

## 2012-08-05 ENCOUNTER — Telehealth: Payer: Self-pay

## 2012-08-05 DIAGNOSIS — R251 Tremor, unspecified: Secondary | ICD-10-CM

## 2012-08-05 DIAGNOSIS — R42 Dizziness and giddiness: Secondary | ICD-10-CM

## 2012-08-07 ENCOUNTER — Telehealth: Payer: Self-pay

## 2012-08-07 DIAGNOSIS — R42 Dizziness and giddiness: Secondary | ICD-10-CM

## 2012-08-07 DIAGNOSIS — R251 Tremor, unspecified: Secondary | ICD-10-CM

## 2012-08-07 NOTE — Telephone Encounter (Signed)
Mri brain scheduled for 08/12/2012 at 6:45 pm. Patient aware

## 2012-08-07 NOTE — Telephone Encounter (Signed)
Mri with contrast cancelled. Reordered for w/without per [facility] protocol

## 2012-08-12 ENCOUNTER — Other Ambulatory Visit: Payer: Self-pay

## 2012-08-12 ENCOUNTER — Telehealth: Payer: Self-pay

## 2012-08-12 ENCOUNTER — Ambulatory Visit
Admission: RE | Admit: 2012-08-12 | Discharge: 2012-08-12 | Disposition: A | Discharge status: Home / Self Care | Payer: BC Managed Care – PPO | Source: Ambulatory Visit | Attending: Internal Medicine | Admitting: Internal Medicine

## 2012-08-12 DIAGNOSIS — R251 Tremor, unspecified: Secondary | ICD-10-CM

## 2012-08-12 DIAGNOSIS — R42 Dizziness and giddiness: Secondary | ICD-10-CM

## 2012-08-12 MED ORDER — GADOBENATE DIMEGLUMINE 529 MG/ML IV SOLN
20.0000 mL | Freq: Once | INTRAVENOUS | Status: AC | PRN
  Administered 2012-08-12: 20 mL via INTRAVENOUS

## 2012-08-12 NOTE — Telephone Encounter (Signed)
Entered to obtain insurance information for pre-cert of MRI brain

## 2012-09-09 ENCOUNTER — Encounter: Payer: Self-pay | Admitting: Internal Medicine

## 2012-09-09 ENCOUNTER — Ambulatory Visit (INDEPENDENT_AMBULATORY_CARE_PROVIDER_SITE_OTHER): Payer: BC Managed Care – PPO | Admitting: Internal Medicine

## 2012-09-09 VITALS — BP 166/104 | HR 80 | Temp 98.5°F | Wt 222.0 lb

## 2012-09-09 DIAGNOSIS — R251 Tremor, unspecified: Secondary | ICD-10-CM

## 2012-09-09 DIAGNOSIS — E785 Hyperlipidemia, unspecified: Secondary | ICD-10-CM

## 2012-09-09 DIAGNOSIS — D860 Sarcoidosis of lung: Secondary | ICD-10-CM

## 2012-09-09 DIAGNOSIS — J99 Respiratory disorders in diseases classified elsewhere: Secondary | ICD-10-CM

## 2012-09-09 DIAGNOSIS — R030 Elevated blood-pressure reading, without diagnosis of hypertension: Secondary | ICD-10-CM

## 2012-09-09 DIAGNOSIS — F411 Generalized anxiety disorder: Secondary | ICD-10-CM

## 2012-09-09 DIAGNOSIS — F419 Anxiety disorder, unspecified: Secondary | ICD-10-CM

## 2012-09-09 DIAGNOSIS — D869 Sarcoidosis, unspecified: Secondary | ICD-10-CM

## 2012-09-09 DIAGNOSIS — R259 Unspecified abnormal involuntary movements: Secondary | ICD-10-CM

## 2012-09-09 LAB — COMPREHENSIVE METABOLIC PANEL
Alkaline Phosphatase: 65 U/L (ref 39–117)
BUN: 17 mg/dL (ref 6–23)
Creat: 0.76 mg/dL (ref 0.50–1.35)
Glucose, Bld: 96 mg/dL (ref 70–99)
Total Bilirubin: 0.9 mg/dL (ref 0.3–1.2)

## 2012-09-09 NOTE — Addendum Note (Signed)
Addended by: Judy Pimple on: 09/09/2012 01:20 PM   Modules accepted: Orders

## 2012-09-09 NOTE — Progress Notes (Signed)
  Subjective:    Patient ID: Charles Villarreal, male    DOB: 18-Sep-1965, 47 y.o.   MRN: [id number]  HPI Pt saw Dr. Marjory Lies, Neurologist with [facility] recently for evaluation of tremor. MRI was reviewed. Neurologist thought pt. had anxiety and referred back to PCP for medication. Wanted TSH and B12 checked. TSH was checked by pulmonologist in Sept 2013 and was WNL. Pt used to have issues with alcohol abuse a few years ago but seems not to be an issue now. 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 Pt has a history of pulmonary sarcoidosis followed by Dr. Sherene Sires, pulmonologist with [facility]. Occasionally gets an exacerbation with cough that requires steroid treatment may be once or twice a year. Otherwise sarcoidosis seems to be contained just to lungs.  Patient gives history of having some unsteadiness on his feet when sitting watching a tennis match and trying to stand up to go down the stairs. Has had some episodes of vertigo treated with meclizine. Has noticed some tremor in his hands from time to time particularly when he is anxious. This seems to start fairly abruptly for no good reason. Father has history of essential tremor apparently. Noticed it recently at [employer]. Sometimes his hand writing is very poor because of tremor. He is accompanied by his wife today. His blood pressure is elevated which is of concern. Says he is anxious. Was tearful talking about this with his wife and me today. Is worried about multiple sclerosis, systemic sarcoidosis or some other neurological disease such as Parkinson's. Wants to see another neurologist.  Recently started on statin again for hyperlipidemia. However had these symptoms before starting on statin medication.    Review of Systems     Objective:   Physical Exam tremor seemed to get worse when he was anxious  today in the office and appeared to be bilateral. Somewhat tearful in describing symptoms. Blood pressure elevated 166/104.        Assessment & Plan:  Possible essential tremor-try Inderal LA 80 mg daily  Issues with balance-says he had trouble doing tandem walking for neurologist  Anxiety-has counselor to speak with regarding issues at work  Remote history of alcohol abuse check B12 level and folate level  Elevated blood pressure-recheck in 2 weeks  Hyperlipidemia  Pulmonary sarcoidosis  Recent MRI was within normal limits. Check TSH B12 folate levels today and sed rate. At his and  wife's request, get another neurology opinion.  Spent 30 minutes talking with patient and wife about these issues.

## 2012-09-09 NOTE — Addendum Note (Signed)
Addended by: Judy Pimple on: 09/09/2012 01:16 PM   Modules accepted: Orders

## 2012-09-09 NOTE — Patient Instructions (Signed)
Try Inderal LA 80 mg daily for tremor. We will arrange for neurology consultation. Return in 2 weeks for blood pressure check.

## 2012-09-10 ENCOUNTER — Telehealth: Payer: Self-pay | Admitting: Internal Medicine

## 2012-09-10 LAB — SEDIMENTATION RATE: Sed Rate: 1 mm/hr (ref 0–16)

## 2012-09-10 LAB — T4, FREE: Free T4: 1.35 ng/dL (ref 0.80–1.80)

## 2012-09-10 NOTE — Telephone Encounter (Signed)
Called to tell patient his SGOT was 93 SGPT 110 and previously in September SGOT was 45 and SGPT 55. Admits he may have been drinking a bit too much of the past few days. Has been taking some Advil for his knee. He has a physical exam scheduled here in February. He agrees to come in in a couple weeks for blood pressure check on beta blocker. We have him on a beta blocker to see if that will help his essential tremor. TSH B12 and folate levels were normal. Sedimentation rate was 1. Free T4 is normal. These results were given to him today.

## 2012-09-11 ENCOUNTER — Telehealth: Payer: Self-pay

## 2012-09-11 NOTE — Telephone Encounter (Signed)
Patient scheduled for an appointment with Dr. Jonetta Osgood at [facility] for neurology consult on 10/22/2012 at 2:00pm. He has been informed of this appointment and records have been faxed.

## 2012-10-17 ENCOUNTER — Other Ambulatory Visit: Payer: BC Managed Care – PPO | Admitting: Internal Medicine

## 2012-10-17 DIAGNOSIS — Z0000 Encounter for general adult medical examination without abnormal findings: Secondary | ICD-10-CM

## 2012-10-17 LAB — CBC WITH DIFFERENTIAL/PLATELET
Eosinophils Absolute: 0.2 10*3/uL (ref 0.0–0.7)
Eosinophils Relative: 3 % (ref 0–5)
HCT: 43.2 % (ref 39.0–52.0)
Hemoglobin: 14.9 g/dL (ref 13.0–17.0)
Lymphs Abs: 1.1 10*3/uL (ref 0.7–4.0)
MCH: 32.1 pg (ref 26.0–34.0)
MCV: 93.1 fL (ref 78.0–100.0)
Monocytes Relative: 14 % — ABNORMAL HIGH (ref 3–12)
Neutrophils Relative %: 58 % (ref 43–77)
RBC: 4.64 MIL/uL (ref 4.22–5.81)

## 2012-10-17 LAB — COMPREHENSIVE METABOLIC PANEL
CO2: 27 mEq/L (ref 19–32)
Glucose, Bld: 95 mg/dL (ref 70–99)
Sodium: 139 mEq/L (ref 135–145)
Total Bilirubin: 0.8 mg/dL (ref 0.3–1.2)
Total Protein: 7.1 g/dL (ref 6.0–8.3)

## 2012-10-17 LAB — LIPID PANEL
Cholesterol: 166 mg/dL (ref 0–200)
Triglycerides: 100 mg/dL (ref <150)
VLDL: 20 mg/dL (ref 0–40)

## 2012-10-18 ENCOUNTER — Encounter: Payer: Self-pay | Admitting: Internal Medicine

## 2012-10-18 ENCOUNTER — Ambulatory Visit (INDEPENDENT_AMBULATORY_CARE_PROVIDER_SITE_OTHER): Payer: BC Managed Care – PPO | Admitting: Internal Medicine

## 2012-10-18 VITALS — BP 158/98 | HR 60 | Temp 97.7°F | Ht 72.5 in | Wt 224.0 lb

## 2012-10-18 DIAGNOSIS — D869 Sarcoidosis, unspecified: Secondary | ICD-10-CM

## 2012-10-18 DIAGNOSIS — Z87898 Personal history of other specified conditions: Secondary | ICD-10-CM

## 2012-10-18 DIAGNOSIS — R030 Elevated blood-pressure reading, without diagnosis of hypertension: Secondary | ICD-10-CM

## 2012-10-18 DIAGNOSIS — G250 Essential tremor: Secondary | ICD-10-CM

## 2012-10-18 DIAGNOSIS — Z8669 Personal history of other diseases of the nervous system and sense organs: Secondary | ICD-10-CM

## 2012-10-18 DIAGNOSIS — Z0000 Encounter for general adult medical examination without abnormal findings: Secondary | ICD-10-CM

## 2012-10-18 DIAGNOSIS — R6889 Other general symptoms and signs: Secondary | ICD-10-CM

## 2012-10-18 DIAGNOSIS — E785 Hyperlipidemia, unspecified: Secondary | ICD-10-CM

## 2012-10-18 LAB — POCT URINALYSIS DIPSTICK
Bilirubin, UA: NEGATIVE
Glucose, UA: NEGATIVE
Ketones, UA: NEGATIVE
Spec Grav, UA: <=1.005

## 2012-10-18 LAB — FERRITIN: Ferritin: 192 ng/mL (ref 22–322)

## 2012-10-19 LAB — HEPATITIS B SURFACE ANTIGEN: Hepatitis B Surface Ag: NEGATIVE

## 2012-10-19 LAB — HEPATITIS B CORE ANTIBODY, TOTAL: Hep B Core Total Ab: NEGATIVE

## 2012-10-19 LAB — HEPATITIS B SURFACE ANTIBODY,QUALITATIVE: Hep B S Ab: REACTIVE — AB

## 2012-10-21 LAB — ANA: Anti Nuclear Antibody(ANA): NEGATIVE

## 2012-10-21 NOTE — Progress Notes (Signed)
Left message for patient to call.

## 2012-10-22 NOTE — Progress Notes (Signed)
Left message for patient to call.

## 2012-10-28 NOTE — Progress Notes (Signed)
Letter sent to patient with results, as he has not answered any of my calls

## 2012-11-08 ENCOUNTER — Other Ambulatory Visit: Payer: Self-pay | Admitting: Internal Medicine

## 2012-11-08 ENCOUNTER — Ambulatory Visit: Payer: BC Managed Care – PPO | Admitting: Internal Medicine

## 2012-11-10 DIAGNOSIS — E785 Hyperlipidemia, unspecified: Secondary | ICD-10-CM | POA: Insufficient documentation

## 2012-11-10 NOTE — Patient Instructions (Addendum)
Continue beta blocker, keep appointment for neurology consultation at [facility] and followup here in 3 weeks

## 2012-11-10 NOTE — Progress Notes (Signed)
Subjective:    Patient ID: Charles Villarreal, male    DOB: [birth year], 47 y.o.   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 He and his wife were here recently concerned about his tremor and feeling faint. He has appointment soon to be seen [facility] by a neurologist. He has hyperlipidemia and has been placed on statin therapy. History of sarcoidosis followed by Dr. Sherene Sires. He has a history of alcohol abuse but denies abusing alcohol presently. He does say he drinks "socially". 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 Episodes of feeling faint for no apparent reason. One episode involved coming down from some bleachers after watching a game. Recent MRI of the brain was negative. Recently started on beta blocker for tremor. History of benign positional vertigo.  No known drug allergies had strep throat 2001 left knee surgery 1982 another left knee surgery 1985 and appendectomy 1985. Does not smoke.  Has seen Durwin Nora  for counseling in the past. History of anxiety depression in 1998. Wife is an attorney who helps with legal matters in the funeral home business.  They have 3 daughters. Relationship with his father has been difficult at times. Sister recently opened cremation business in association with the funeral home. There have been some disagreements with advertising. Brother-in-law works at the funeral home as an Public relations account executive.  Family history: Sister with history of breast cancer. Mother with history of hypertension, hypothyroidism and diabetes mellitus. Father with history of valve replacement.   Review of Systems  Constitutional: Positive for fatigue.  Eyes: Negative.   Respiratory: Negative.   Cardiovascular: Negative.   Gastrointestinal: Negative.   Endocrine: Negative.   Genitourinary: Negative.        History of erectile dysfunction  Neurological:       Bilateral hand tremor. Episodes of  faint feeling for no apparent reason. Recent MRI of brain was negative.       Objective:   Physical Exam  Vitals reviewed. Constitutional: He is oriented to person, place, and time. He appears well-developed and well-nourished. No distress.  HENT:  Head: Normocephalic and atraumatic.  Right Ear: External ear normal.  Left Ear: External ear normal.  Mouth/Throat: Oropharynx is clear and moist. No oropharyngeal exudate.  Eyes: Conjunctivae and EOM are normal. Pupils are equal, round, and reactive to light. Right eye exhibits no discharge. Left eye exhibits no discharge. No scleral icterus.  Neck: Neck supple. No JVD present. No thyromegaly present.  Cardiovascular: Normal rate, regular rhythm, normal heart sounds and intact distal pulses.   No murmur heard. Pulmonary/Chest: Effort normal and breath sounds normal. No respiratory distress. He has no wheezes. He has no rales.  Abdominal: Soft. Bowel sounds are normal. He exhibits no distension and no mass. There is no tenderness. There is no rebound and no guarding.  Genitourinary: Prostate normal.  Musculoskeletal: Normal range of motion. He exhibits no edema.  Lymphadenopathy:    He has no cervical adenopathy.  Neurological: He is alert and oriented to person, place, and time. He has normal reflexes. No cranial nerve deficit. Coordination normal.  Bilateral hand tremor  Skin: Skin is warm and dry. No rash noted. He is not diaphoretic. No erythema.  Psychiatric: He has a normal mood and affect. His behavior is normal. Judgment and thought content normal.          Assessment & Plan:  Bilateral hand tremor-suspect essential tremor but also worried  that patient could be drinking alcohol in excess  History of benign positional vertigo  History of erectile dysfunction  History of GE reflux  History of sarcoidosis-stable followed by Dr. Sherene Sires    Elevated blood pressure-recently we placed him on beta blocker to see if that would  help hand tremor. He thinks that has helped a bit. Will be interested to see what neurologist at [facility] has to offer. We could consider cardiology evaluation. He will return in approximately 3 weeks.

## 2012-11-26 ENCOUNTER — Ambulatory Visit: Payer: Self-pay | Admitting: Internal Medicine

## 2012-12-03 ENCOUNTER — Encounter: Payer: Self-pay | Admitting: Internal Medicine

## 2012-12-03 ENCOUNTER — Ambulatory Visit (INDEPENDENT_AMBULATORY_CARE_PROVIDER_SITE_OTHER): Payer: BC Managed Care – PPO | Admitting: Internal Medicine

## 2012-12-03 VITALS — BP 142/84 | HR 60 | Temp 97.9°F | Wt 225.0 lb

## 2012-12-03 DIAGNOSIS — E785 Hyperlipidemia, unspecified: Secondary | ICD-10-CM

## 2012-12-03 DIAGNOSIS — H8110 Benign paroxysmal vertigo, unspecified ear: Secondary | ICD-10-CM

## 2012-12-03 DIAGNOSIS — G250 Essential tremor: Secondary | ICD-10-CM

## 2012-12-03 DIAGNOSIS — I10 Essential (primary) hypertension: Secondary | ICD-10-CM

## 2012-12-03 DIAGNOSIS — D869 Sarcoidosis, unspecified: Secondary | ICD-10-CM

## 2012-12-03 MED ORDER — LOSARTAN POTASSIUM 50 MG PO TABS: 50.0000 mg | ORAL_TABLET | Freq: Every day | ORAL | Status: DC

## 2012-12-03 MED ORDER — PROPRANOLOL HCL ER 80 MG PO CP24: 80.0000 mg | ORAL_CAPSULE | Freq: Every day | ORAL | Status: DC

## 2012-12-11 ENCOUNTER — Other Ambulatory Visit: Payer: Self-pay | Admitting: Internal Medicine

## 2012-12-20 ENCOUNTER — Telehealth: Payer: Self-pay

## 2012-12-20 DIAGNOSIS — I10 Essential (primary) hypertension: Secondary | ICD-10-CM

## 2012-12-20 NOTE — Telephone Encounter (Signed)
Patient scheduled for an appointment with Dr. Charlton Haws on 01/14/2013 at 2:45 pm. He is aware of this .

## 2012-12-23 NOTE — Progress Notes (Signed)
  Subjective:    Patient ID: Charles Villarreal, male    DOB: Mar 06, 1966, 47 y.o.   MRN: [id number]  [facility] felt he did not have Parkinson's disease. He did think patient had essential tremor which I had previously treated with beta blocker. However neurologist was concerned about patient's blood pressure. Apparently 24-hour and look toward blood pressure monitor has been ordered by neurologist. Apparently no good explanation for patient's recent episodes of lightheadedness other than may be benign positional vertigo. Workup here showed negative MRI of the brain. He does have history of sarcoidosis and hyperlipidemia. Followed by Dr. Sherene Sires for sarcoidosis. Recently started on statin medication. Significant amount of stress at work. Patient already on amlodipine 5 mg daily   Review of Systems     Objective:   Physical Exam neck is supple without thyromegaly JVD or carotid bruits. Essential tremor noted. Chest clear to auscultation. Cardiac exam regular rate and rhythm. Extremities without edema.        Assessment & Plan:  Elevated blood pressure-likely essential hypertension. Patient already on amlodipine 5 mg daily and propranolol for tremor. Patient thinks tremor is better with propranolol. Add Cozaar 50 mg daily. Await results of 24-hour and look toward blood pressure monitor.  Essential tremor  History of sarcoidosis  Vertigo  Plan: Await results of 24-hour and look toward blood pressure monitor. Patient may need to see cardiologist for blood pressure management.

## 2012-12-23 NOTE — Patient Instructions (Addendum)
Add Cozaar to amlodipine and propranolol. Return in 4 weeks. May need to see cardiologist. Continue lipid-lowering medication.

## 2013-01-06 ENCOUNTER — Ambulatory Visit: Payer: BC Managed Care – PPO | Admitting: Internal Medicine

## 2013-01-10 ENCOUNTER — Ambulatory Visit (INDEPENDENT_AMBULATORY_CARE_PROVIDER_SITE_OTHER): Payer: BC Managed Care – PPO | Admitting: Internal Medicine

## 2013-01-10 ENCOUNTER — Encounter: Payer: Self-pay | Admitting: Internal Medicine

## 2013-01-10 VITALS — BP 142/94 | HR 72 | Wt 216.0 lb

## 2013-01-10 DIAGNOSIS — G250 Essential tremor: Secondary | ICD-10-CM

## 2013-01-10 DIAGNOSIS — E785 Hyperlipidemia, unspecified: Secondary | ICD-10-CM

## 2013-01-10 DIAGNOSIS — I10 Essential (primary) hypertension: Secondary | ICD-10-CM

## 2013-01-10 DIAGNOSIS — G252 Other specified forms of tremor: Secondary | ICD-10-CM

## 2013-01-10 LAB — BASIC METABOLIC PANEL
BUN: 11 mg/dL (ref 6–23)
Chloride: 103 mEq/L (ref 96–112)
Potassium: 4 mEq/L (ref 3.5–5.3)
Sodium: 141 mEq/L (ref 135–145)

## 2013-01-10 MED ORDER — LOSARTAN POTASSIUM 100 MG PO TABS: 100.0000 mg | ORAL_TABLET | Freq: Every day | ORAL | Status: DC

## 2013-01-10 NOTE — Patient Instructions (Addendum)
Increase Cozaar to 100 mg daily. Continue propranolol and amlodipine. See Cardiologist June 3

## 2013-01-10 NOTE — Progress Notes (Signed)
  Subjective:    Patient ID: Charles Villarreal, male    DOB: [birth year], 47 y.o.   MRN: [id number]  HPI  47 year old white male recently had 24-hour ambulatory blood pressure monitor ordered by neurologist at [facility]. Patient currently on amlodipine and losartan 50 mg daily. Is also on Lipitor for hyperlipidemia, aspirin 81 mg daily, propranolol ER for essential tremor. Has had intermittent episodes of dizziness. Was sent to [facility] for that reason as well as tremor. See scanned note by neurologist. Report from [facility] indicated patient had maximal blood pressure of 191 systolic , 129 diastolic not at the same time. Minimum systolic 101. Report said blood pressure was very "up and down". Lower during sleep. Neurologist suggested perhaps doing 24-hour urine to rule out pheochromocytoma. Blood pressure today is elevated. Patient is trying to buy a new house and sell his house. Says he is a bit stressed today. B-met was drawn today in followup of starting Cozaar 50 mg daily at last visit.    Review of Systems     Objective:   Physical Exam Neck is supple without JVD, thyromegaly, or carotid bruits. Chest clear to auscultation. Cardiac exam: regular rate and rhythm. Extremities without edema.        Assessment & Plan:  Labile hypertension-continue amlodipine and increase Cozaar to 100 mg daily  History of sarcoidosis-followed by Dr. Sherene Sires  History of hyperlipidemia-currently on statin medication  Essential tremor-continue propranolol ER  Plan: Continue amlodipine. Continue propranolol ER for essential tremor. Increase Cozaar to 100 mg daily. Patient has appointment to see Dr. Eden Emms June 3rd.

## 2013-01-14 ENCOUNTER — Ambulatory Visit: Payer: BC Managed Care – PPO | Admitting: Cardiovascular Disease

## 2013-01-28 ENCOUNTER — Ambulatory Visit: Payer: BC Managed Care – PPO | Admitting: Cardiovascular Disease

## 2013-02-18 ENCOUNTER — Other Ambulatory Visit: Payer: Self-pay | Admitting: Internal Medicine

## 2013-02-18 NOTE — Telephone Encounter (Signed)
Needs to get lipid/liver here  before seeing Dr. Eden Emms on July 31st. Please call him and refill Lipitor x 3 months.

## 2013-02-19 NOTE — Telephone Encounter (Signed)
Lipitor refilled. Left message for patient to call us back to scheduled labs before 03/27/13.

## 2013-03-13 ENCOUNTER — Other Ambulatory Visit: Payer: Self-pay | Admitting: Internal Medicine

## 2013-03-27 ENCOUNTER — Encounter: Payer: Self-pay | Admitting: Cardiovascular Disease

## 2013-03-27 ENCOUNTER — Ambulatory Visit (INDEPENDENT_AMBULATORY_CARE_PROVIDER_SITE_OTHER): Payer: BC Managed Care – PPO | Admitting: Cardiovascular Disease

## 2013-03-27 VITALS — BP 150/82 | HR 62 | Wt 223.0 lb

## 2013-03-27 DIAGNOSIS — D869 Sarcoidosis, unspecified: Secondary | ICD-10-CM

## 2013-03-27 DIAGNOSIS — I10 Essential (primary) hypertension: Secondary | ICD-10-CM

## 2013-03-27 DIAGNOSIS — I701 Atherosclerosis of renal artery: Secondary | ICD-10-CM

## 2013-03-27 NOTE — Progress Notes (Signed)
Patient ID: Charles Villarreal, male   DOB: [birth year], 47 y.o.   MRN: [id number] 47 yo Dr Lenord Fellers referred for HTN.  Has had on going Rx and adjustments last 6 months. He runs [employer].  Has had a lot of stress with his sister recently coming back to work at company and buying a new house.  Losartan recently increased.  On inderal as it also helps his familial tremor  Initial w/u at [facility].  24 hr BP monitory confirmed HTN  Some white coat component as well.  Moderate ETOH may have been more in past.  Pulmonary sarcoid followed by Dr Sherene Sires.  Allergies seem to be worse this month.  ? Pheo labs drawn at [facility]  No excess salt Compliant with meds.    Labs here 5/14 normal Cr and K 2/14 LDL 102 with normal LFTs   ROS: Denies fever, malais, weight loss, blurry vision, decreased visual acuity, cough, sputum, SOB, hemoptysis, pleuritic pain, palpitaitons, heartburn, abdominal pain, melena, lower extremity edema, claudication, or rash.  All other systems reviewed and negative   General: Affect appropriate Healthy:  appears stated age HEENT: normal Neck supple with no adenopathy JVP normal no bruits no thyromegaly Lungs clear with no wheezing and good diaphragmatic motion Heart:  S1/S2 no murmur,rub, gallop or click PMI normal Abdomen: benighn, BS positve, no tenderness, no AAA no bruit.  No HSM or HJR Distal pulses intact with no bruits No edema Neuro non-focal Skin warm and dry No muscular weakness  Medications Current Outpatient Prescriptions  Medication Sig Dispense Refill  . amLODipine (NORVASC) 5 MG tablet TAKE ONE TABLET EACH DAY FOR HYPERTENSION  30 tablet  5  . ascorbic acid (VITAMIN C) 500 MG tablet Take 500 mg by mouth daily.      Marland Kitchen aspirin 81 MG chewable tablet Chew 81 mg by mouth daily.        Marland Kitchen atorvastatin (LIPITOR) 40 MG tablet TAKE ONE TABLET BY MOUTH ONCE DAILY  30 tablet  3  . ibuprofen (ADVIL,MOTRIN) 800 MG tablet Take 800 mg by mouth every 8 (eight) hours as  needed.        Marland Kitchen losartan (COZAAR) 100 MG tablet Take 1 tablet (100 mg total) by mouth daily.  30 tablet  3  . meclizine (ANTIVERT) 25 MG tablet Take 25 mg by mouth 3 (three) times daily as needed.       . mometasone (NASONEX) 50 MCG/ACT nasal spray 2 sprays both side twice  17 g  11  . propranolol ER (INDERAL LA) 80 MG 24 hr capsule Take 1 capsule (80 mg total) by mouth daily.  30 capsule  5  . ranitidine (ZANTAC) 150 MG capsule Take 150 mg by mouth at bedtime.         No current facility-administered medications for this visit.    Allergies Review of patient's allergies indicates no known allergies.  Family History: No family history on file.  Social History: History   Social History  . Marital Status: Married    Spouse Name: N/A    Number of Children: N/A  . Years of Education: N/A   Occupational History  . Not on file.   Social History Main Topics  . Smoking status: Never Smoker   . Smokeless tobacco: Former Neurosurgeon    Types: Chew    Quit date: 08/28/1996  . Alcohol Use: Yes     Comment: only occ  . Drug Use: No  . Sexually Active:  Not on file   Other Topics Concern  . Not on file   Social History Narrative  . No narrative on file    Electrocardiogram: 11/29/12  SR rate 62 normal   Assessment and Plan

## 2013-03-27 NOTE — Assessment & Plan Note (Signed)
I think a lot of Charles Villarreal's HTN is driven by family and job stress.  Lability from moderate ETOH.  Will order renal artery duplex. Continue current meds F/U 24 hr monitor in 3 months and if high add low dose diuretic

## 2013-03-27 NOTE — Patient Instructions (Addendum)
Your physician wants you to follow-up in: 3 MONTHS WITH  DR Haywood Filler will receive a reminder letter in the mail two months in advance. If you don't receive a letter, please call our office to schedule the follow-up appointment. Your physician recommends that you continue on your current medications as directed. Please refer to the Current Medication list given to you today. Your physician has requested that you have a renal artery duplex. During this test, an ultrasound is used to evaluate blood flow to the kidneys. Allow one hour for this exam. Do not eat after midnight the day before and avoid carbonated beverages. Take your medications as you usually do.

## 2013-03-27 NOTE — Assessment & Plan Note (Signed)
F/U Dr Sherene Sires Stable without cough or dyspnea.  No need to check cardiac sarcoid at this time with no symptoms and normal ECG

## 2013-04-14 ENCOUNTER — Encounter (INDEPENDENT_AMBULATORY_CARE_PROVIDER_SITE_OTHER): Payer: BC Managed Care – PPO

## 2013-04-14 DIAGNOSIS — I701 Atherosclerosis of renal artery: Secondary | ICD-10-CM

## 2013-04-14 DIAGNOSIS — I10 Essential (primary) hypertension: Secondary | ICD-10-CM

## 2013-05-30 ENCOUNTER — Other Ambulatory Visit: Payer: Self-pay | Admitting: Internal Medicine

## 2013-06-06 ENCOUNTER — Other Ambulatory Visit: Payer: Self-pay | Admitting: Internal Medicine

## 2013-06-26 ENCOUNTER — Encounter (INDEPENDENT_AMBULATORY_CARE_PROVIDER_SITE_OTHER): Payer: Self-pay

## 2013-06-26 ENCOUNTER — Encounter: Payer: Self-pay | Admitting: Cardiovascular Disease

## 2013-06-26 ENCOUNTER — Ambulatory Visit (INDEPENDENT_AMBULATORY_CARE_PROVIDER_SITE_OTHER): Payer: BC Managed Care – PPO | Admitting: Cardiovascular Disease

## 2013-06-26 VITALS — BP 140/80 | HR 64 | Ht 73.0 in | Wt 220.0 lb

## 2013-06-26 DIAGNOSIS — G250 Essential tremor: Secondary | ICD-10-CM

## 2013-06-26 DIAGNOSIS — I10 Essential (primary) hypertension: Secondary | ICD-10-CM

## 2013-06-26 DIAGNOSIS — D869 Sarcoidosis, unspecified: Secondary | ICD-10-CM

## 2013-06-26 DIAGNOSIS — R55 Syncope and collapse: Secondary | ICD-10-CM

## 2013-06-26 MED ORDER — ATORVASTATIN CALCIUM 40 MG PO TABS: 40.0000 mg | ORAL_TABLET | Freq: Every day | ORAL | Status: DC

## 2013-06-26 NOTE — Assessment & Plan Note (Signed)
Stable no wheezing or coughing f/u with pulmonary

## 2013-06-26 NOTE — Assessment & Plan Note (Signed)
Improved continue beta blocker F/U neuro

## 2013-06-26 NOTE — Patient Instructions (Signed)
Your physician wants you to follow-up in:   6 MONTHS WITH  DR Haywood Filler will receive a reminder letter in the mail two months in advance. If you don't receive a letter, please call our office to schedule the follow-up appointment. Your physician has requested that you regularly monitor and record your blood pressure readings at home. Please use the same machine at the same time of day to check your readings and record them to bring to your follow-up visit. IN 8 WEEKS  Your physician recommends that you continue on your current medications as directed. Please refer to the Current Medication list given to you today.

## 2013-06-26 NOTE — Progress Notes (Signed)
Patient ID: Charles Villarreal, male   DOB: [birth year], 47 y.o.   MRN: [id number] 47 yo Dr Lenord Fellers referred for HTN. Has had on going Rx and adjustments last 6 months. 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 Has had a lot of stress with his sister recently coming back to work at company and buying a new house. Losartan recently increased. On inderal as it also helps his familial tremor Initial w/u at [facility]. 24 hr BP monitory confirmed HTN Some white coat component as well. Moderate ETOH may have been more in past. Pulmonary sarcoid followed by Dr Sherene Sires. Allergies seem to be worse this month. ? Pheo labs drawn at [facility] No excess salt Compliant with meds.   Labs here 5/14 normal Cr and K  2/14 LDL 102 with normal LFTs   Renal duplex in August 2014 normal with no RAS  Doing well with lifestyle modifications. Less ETOH. Exercising and loosing weight  Getting along with sister better   ROS: Denies fever, malais, weight loss, blurry vision, decreased visual acuity, cough, sputum, SOB, hemoptysis, pleuritic pain, palpitaitons, heartburn, abdominal pain, melena, lower extremity edema, claudication, or rash.  All other systems reviewed and negative  General: Affect appropriate Healthy:  appears stated age HEENT: normal Neck supple with no adenopathy JVP normal no bruits no thyromegaly Lungs clear with no wheezing and good diaphragmatic motion Heart:  S1/S2 no murmur, no rub, gallop or click PMI normal Abdomen: benighn, BS positve, no tenderness, no AAA no bruit.  No HSM or HJR Distal pulses intact with no bruits No edema Neuro non-focal Skin warm and dry No muscular weakness   Current Outpatient Prescriptions  Medication Sig Dispense Refill  . amLODipine (NORVASC) 5 MG tablet TAKE ONE TABLET EACH DAY FOR HYPERTENSION  30 tablet  5  . ascorbic acid (VITAMIN C) 500 MG tablet Take 500 mg by mouth daily.      Marland Kitchen aspirin 81 MG chewable tablet Chew 81 mg by mouth daily.        Marland Kitchen atorvastatin (LIPITOR) 40 MG  tablet TAKE ONE TABLET BY MOUTH ONCE DAILY  30 tablet  3  . ibuprofen (ADVIL,MOTRIN) 800 MG tablet Take 800 mg by mouth every 8 (eight) hours as needed.        Marland Kitchen losartan (COZAAR) 100 MG tablet TAKE ONE TABLET BY MOUTH ONCE DAILY  30 tablet  2  . mometasone (NASONEX) 50 MCG/ACT nasal spray 2 sprays both side twice  17 g  11  . propranolol ER (INDERAL LA) 80 MG 24 hr capsule TAKE ONE CAPSULE EACH DAY  30 capsule  3  . ranitidine (ZANTAC) 150 MG capsule Take 150 mg by mouth at bedtime.         No current facility-administered medications for this visit.    Allergies  Review of patient's allergies indicates no known allergies.  Electrocardiogram: 4/16 SR normal ECG no LVH  SR rate 55 normal   Assessment and Plan

## 2013-06-26 NOTE — Assessment & Plan Note (Signed)
Improved.  Compliant with meds.  Give him 8 weeks more of diet and exercise then check 24 hr BP monitor and compare to one at [facility]

## 2013-06-26 NOTE — Assessment & Plan Note (Signed)
Resolved no further episodes No evidence of significant structural heart disease

## 2013-08-11 ENCOUNTER — Ambulatory Visit: Payer: BC Managed Care – PPO | Admitting: Cardiovascular Disease

## 2013-08-26 DIAGNOSIS — D869 Sarcoidosis, unspecified: Secondary | ICD-10-CM | POA: Insufficient documentation

## 2013-08-29 ENCOUNTER — Ambulatory Visit (INDEPENDENT_AMBULATORY_CARE_PROVIDER_SITE_OTHER): Payer: BC Managed Care – PPO | Admitting: Internal Medicine

## 2013-08-29 ENCOUNTER — Encounter: Payer: Self-pay | Admitting: Internal Medicine

## 2013-08-29 VITALS — BP 130/82 | HR 52 | Temp 98.3°F | Ht 73.0 in | Wt 214.0 lb

## 2013-08-29 DIAGNOSIS — J069 Acute upper respiratory infection, unspecified: Secondary | ICD-10-CM

## 2013-08-29 MED ORDER — PREDNISONE 10 MG PO TABS: ORAL_TABLET | ORAL | Status: DC

## 2013-08-29 MED ORDER — AZITHROMYCIN 250 MG PO TABS: ORAL_TABLET | ORAL | Status: DC

## 2013-08-29 MED ORDER — TRAMADOL HCL 50 MG PO TABS: ORAL_TABLET | ORAL | Status: DC

## 2013-08-29 NOTE — Progress Notes (Signed)
Subjective:    Patient ID: Charles Villarreal, male    DOB: 03-Dec-1965    MRN: [id number]  Brief patient profile:  47yowm with minimal smoking hx , dx  of sarcoid (Positive transbronchial biopsy 05/19/03)  With  asymptomatic hilar adenopathy. Hx of atypical chest pain that we treated with Protonix after a negative cardiac workup was completed on May 13, 2003.  Did not ever require chronic steroids.    History of Present Illness   08/29/2013 acuteov/Orian Amberg re: URI Chief Complaint  Patient presents with  . Follow-up    Sinus congestion and cough w/ brownish mucus x1 week.  Just got over the flu   08/22/13 acute onset tired achy fever/ chills/ no appetite but no n or v, exp to his two children same symptoms and all tested pos flu but he was never rx'd  and  Then developed cough/congestion  while all other symptoms improving in setting of nasal congestion. No  need for saba    No obvious day to day or daytime variabilty or assoc sob or cp or chest tightness, subjective wheeze overt sinus or hb symptoms. No unusual exp hx or h/o childhood pna/ asthma or knowledge of premature birth.  Sleeping ok without nocturnal  or early am exacerbation  of respiratory  c/o's or need for noct saba. Also denies any obvious fluctuation of symptoms with weather or environmental changes or other aggravating or alleviating factors except as outlined above   Current Medications, Allergies, Complete Past Medical History, Past Surgical History, Family History, and Social History were reviewed in [employer] record.  ROS  The following are not active complaints unless bolded sore throat, dysphagia, dental problems, itching, sneezing,  nasal congestion or excess/ purulent secretions, ear ache,   fever, chills, sweats, unintended wt loss, pleuritic or exertional cp, hemoptysis,  orthopnea pnd or leg swelling, presyncope, palpitations, heartburn, abdominal pain, anorexia, nausea, vomiting, diarrhea  or  change in bowel or urinary habits, change in stools or urine, dysuria,hematuria,  rash, arthralgias, visual complaints, headache, numbness weakness or ataxia or problems with walking or coordination,  change in mood/affect or memory.                           Past Medical History:  INSOMNIA (ICD-780.52)  PULMONARY SARCOIDOSIS (ICD-135) .........................Marland KitchenWert  - Positive transbronchial biopsy 05/19/03  - Opth eval done each Fall > no sarcoid (Dr Lindell Spar office)  Health Maintenance.........................................................Filmore Molyneux [name]  - Pneumovax April 05, 2010 ( age 6, booster needed age 89)         Objective:   Physical Exam     wt 206 Jul 27, 2009 > 213 February 22, 2010 > 219 12/22/2010 >211 09/08/2011 > 10/05/2011  216> 211 3/22  05/17/2012  224 > 08/30/2013  214  Ambulatory healthy appearing in no acute distress  HEENT: nl dentition, and orophanx. Nl external ear canals without cough reflex , clear nasal drainage  Neck without JVD/Nodes/TM  Lungs nl  BS w/ no wheezing  RRR no s3 or murmur or increase in P2  Abd soft and benign with nl excursion in the supine position. No bruits or organomegaly  Ext warm without calf tenderness, cyanosis clubbing or edema  Skin : along post back with scattered macules -hyperpigmented areas    CXR  05/17/2012 :  Borderline cardiac silhouette enlargement. Stable mediastinal contours. Stable slight hilar prominence. No acute or active pulmonary disease is evident.  EKG and labs ok x LDL 171      Assessment & Plan:

## 2013-08-29 NOTE — Patient Instructions (Signed)
Whenever having respiratory flares start protonix 40 mg Take 30-60 min before first meal of the day until flare is over for a week  Zpak  Prednisone x 6 days if all else fails  Take delsym two tsp every 12 hours and supplement if needed with  tramadol 50 mg up to 2 every 4 hours to suppress the urge to cough. Swallowing water or using ice chips/non mint and menthol containing candies (such as lifesavers or sugarless jolly ranchers) are also effective.  You should rest your voice and avoid activities that you know make you cough.  Once you have eliminated the cough for 3 straight days try reducing the tramadol first,  then the delsym as tolerated.    Call if not 100% back to normal after 4 weeks

## 2013-08-30 DIAGNOSIS — J069 Acute upper respiratory infection, unspecified: Secondary | ICD-10-CM | POA: Insufficient documentation

## 2013-08-30 NOTE — Assessment & Plan Note (Signed)
Explained natural history of uri and why it's necessary in patients at risk to treat GERD aggressively  at least  short term   to reduce risk of evolving cyclical cough initially  triggered by epithelial injury and a heightened sensitivty to the effects of any upper airway irritants,  most importantly acid - related.  That is, the more sensitive the epithelium damaged for virus, the more the cough, the more the secondary reflux (especially in those prone to reflux) the more the irritation of the sensitive mucosa and so on in a cyclical pattern.    For now just rx with zpak and acid suppression/ cough suppression and only add 6 d of prednisone if symtoms persist  No evidence of active sarcoid involvement here, pulmonary f/u is prn

## 2013-09-02 ENCOUNTER — Other Ambulatory Visit: Payer: Self-pay | Admitting: Internal Medicine

## 2013-09-08 ENCOUNTER — Other Ambulatory Visit: Payer: Self-pay | Admitting: *Deleted

## 2013-09-08 ENCOUNTER — Ambulatory Visit (INDEPENDENT_AMBULATORY_CARE_PROVIDER_SITE_OTHER): Payer: BC Managed Care – PPO | Admitting: *Deleted

## 2013-09-08 ENCOUNTER — Encounter: Payer: Self-pay | Admitting: *Deleted

## 2013-09-08 DIAGNOSIS — I10 Essential (primary) hypertension: Secondary | ICD-10-CM

## 2013-09-08 NOTE — Progress Notes (Signed)
Patient ID: Charles Villarreal, male   DOB: 05/11/1966, 48 y.o.   MRN: [id number] 24 Hour ambulatory B/P monitor applied to patient.

## 2013-09-17 ENCOUNTER — Encounter: Payer: Self-pay | Admitting: Cardiovascular Disease

## 2013-09-17 ENCOUNTER — Ambulatory Visit (INDEPENDENT_AMBULATORY_CARE_PROVIDER_SITE_OTHER): Payer: BC Managed Care – PPO | Admitting: Cardiovascular Disease

## 2013-09-17 VITALS — BP 130/84 | HR 63 | Ht 73.0 in | Wt 211.4 lb

## 2013-09-17 DIAGNOSIS — R251 Tremor, unspecified: Secondary | ICD-10-CM

## 2013-09-17 DIAGNOSIS — R259 Unspecified abnormal involuntary movements: Secondary | ICD-10-CM

## 2013-09-17 DIAGNOSIS — I10 Essential (primary) hypertension: Secondary | ICD-10-CM

## 2013-09-17 MED ORDER — PROPRANOLOL HCL ER 80 MG PO CP24: 80.0000 mg | ORAL_CAPSULE | Freq: Every day | ORAL | Status: DC

## 2013-09-17 MED ORDER — LOSARTAN POTASSIUM 100 MG PO TABS: 100.0000 mg | ORAL_TABLET | Freq: Every day | ORAL | Status: DC

## 2013-09-17 MED ORDER — AMLODIPINE BESYLATE 5 MG PO TABS: 5.0000 mg | ORAL_TABLET | Freq: Every day | ORAL | Status: DC

## 2013-09-17 NOTE — Assessment & Plan Note (Signed)
Cholesterol is at goal.  Continue current dose of statin and diet Rx.  No myalgias or side effects.  F/U  LFT's in 6 months. Lab Results  Component Value Date   LDLCALC 102* 10/17/2012

## 2013-09-17 NOTE — Progress Notes (Signed)
Patient ID: Charles GeraldsJohn P Villarreal, male   DOB: 04/07/1966, 48 y.o.   MRN: [id number] 48 yo Dr [name] referred for HTN. Has had on going Rx and adjustments last 6 months. 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 Has had a lot of stress with his sister recently coming back to work at company and buying a new house. Losartan recently increased. On inderal as it also helps his familial tremor [facility]. 24 hr BP monitory confirmed HTN Some white coat component as well. Moderate ETOH may have been more in past. Pulmonary sarcoid followed by Dr Sherene SiresWert. Allergies seem to be worse this month. ? [facility] No excess salt Compliant with meds.   Labs here 5/14 normal Cr and K   2/14 LDL 102 with normal LFTs   Recent flu requiring prednisone 24 hr amb monitor reviewed and average BP 138/83  Acceptable non-dipper but patient says he awoke every 45 minutes when BP cuff turned on    ROS: Denies fever, malais, weight loss, blurry vision, decreased visual acuity, cough, sputum, SOB, hemoptysis, pleuritic pain, palpitaitons, heartburn, abdominal pain, melena, lower extremity edema, claudication, or rash.  All other systems reviewed and negative  General: Affect appropriate Healthy:  appears stated age HEENT: normal Neck supple with no adenopathy JVP normal no bruits no thyromegaly Lungs clear with no wheezing and good diaphragmatic motion Heart:  S1/S2 no murmur, no rub, gallop or click PMI normal Abdomen: benighn, BS positve, no tenderness, no AAA no bruit.  No HSM or HJR Distal pulses intact with no bruits No edema Neuro non-focal  Essential tremor in hands  Skin warm and dry No muscular weakness   Current Outpatient Prescriptions  Medication Sig Dispense Refill  . amLODipine (NORVASC) 5 MG tablet TAKE ONE TABLET EACH DAY FOR HYPERTENSION  30 tablet  5  . ascorbic acid (VITAMIN C) 500 MG tablet Take 500 mg by mouth daily.      Marland Kitchen. aspirin 81 MG chewable tablet Chew 81 mg by mouth daily.         Marland Kitchen. atorvastatin (LIPITOR) 40 MG tablet Take 1 tablet (40 mg total) by mouth daily.  30 tablet  11  . ibuprofen (ADVIL,MOTRIN) 800 MG tablet Take 800 mg by mouth every 8 (eight) hours as needed.        Marland Kitchen. losartan (COZAAR) 100 MG tablet TAKE ONE TABLET BY MOUTH ONCE DAILY  30 tablet  1  . mometasone (NASONEX) 50 MCG/ACT nasal spray 2 sprays both side twice  17 g  11  . propranolol ER (INDERAL LA) 80 MG 24 hr capsule TAKE ONE CAPSULE EACH DAY  30 capsule  3  . ranitidine (ZANTAC) 150 MG capsule Take 150 mg by mouth at bedtime.        . traMADol (ULTRAM) 50 MG tablet 1-2 every 4 hours as needed for cough or pain  40 tablet  0  . azithromycin (ZITHROMAX) 250 MG tablet Take 2 on day one then 1 daily x 4 days  6 tablet  0  . predniSONE (DELTASONE) 10 MG tablet Take  4 each am x 2 days,   2 each am x 2 days,  1 each am x 2 days and stop  14 tablet  0   No current facility-administered medications for this visit.    Allergies  Review of patient's allergies indicates no known allergies.  Electrocardiogram: NSR rate 63  Normal   Assessment and Plan

## 2013-09-17 NOTE — Patient Instructions (Addendum)
Your physician wants you to follow-up in:   6 MONTHS  WITH  DR Haywood FillerNISHAN You will receive a reminder letter in the mail two months in advance. If you don't receive a letter, please call our office to schedule the follow-up appointment. Your physician recommends that you continue on your current medications as directed. Please refer to the Current Medication list given to you today. You have been referred to  DR   TAT   DX  TREMORS

## 2013-09-17 NOTE — Assessment & Plan Note (Signed)
Stable on beta blocker Some flares He would like to see neuro  ? Mysoline  Will refer to Dr Arbutus Leasat

## 2013-09-17 NOTE — Assessment & Plan Note (Signed)
Well controlled.  Continue current medications and low sodium Dash type diet.    

## 2013-09-23 ENCOUNTER — Ambulatory Visit (INDEPENDENT_AMBULATORY_CARE_PROVIDER_SITE_OTHER): Payer: BC Managed Care – PPO | Admitting: Neurology

## 2013-09-23 ENCOUNTER — Encounter: Payer: Self-pay | Admitting: Neurology

## 2013-09-23 VITALS — BP 132/88 | HR 84 | Resp 16 | Ht 73.0 in | Wt 215.2 lb

## 2013-09-23 DIAGNOSIS — G250 Essential tremor: Secondary | ICD-10-CM

## 2013-09-23 DIAGNOSIS — G252 Other specified forms of tremor: Secondary | ICD-10-CM

## 2013-09-23 DIAGNOSIS — I10 Essential (primary) hypertension: Secondary | ICD-10-CM

## 2013-09-23 DIAGNOSIS — R279 Unspecified lack of coordination: Secondary | ICD-10-CM

## 2013-09-23 DIAGNOSIS — R270 Ataxia, unspecified: Secondary | ICD-10-CM

## 2013-09-23 NOTE — Patient Instructions (Signed)
We would like you to have a two hour glucose tolerance test. This test is performed at [facility] located at [facility] - basement level. Please call [phone] to arrange a convenient time to have testing completed.  We are referring [facility] to [facility] for a DaT scan. They will contact you directly to set up a time for this testing. If you do not hear from them they can be contacted at [phone]. We will follow up with you after your DaT scan is complete.

## 2013-09-23 NOTE — Progress Notes (Signed)
Subjective:    Charles Villarreal was seen in consultation in the movement disorder clinic at the request of [name].  His PCP is Margaree MackintoshBAXLEY,MARY J, MD.  The evaluation is for tremor.  He was [facility] seen [facility] the [facility].  I reviewed those records via Care Everywhere.   The pt states that the first sx started about 2 years ago and it was an acute episode of loss of balance and dizziness.  [place] at [place] [employer] [place]  He was dizzy and off balance for about 2 hours.  Admittedly, it was very hot outside.  Not long thereafter, he would note intermittent trouble writing because of tremor.  He would note tremor with other activites as well, such as with drinking out of a cup or using the arm.  This has gotten better with diet and lifestyle modifications (less caffeine, less alcohol, more exercise, adding propranolol).  The tremor can be R or L and is independent of activity.  He was started on propranolol by [name] for the tremor and BP.  [facility] and had wore a BP monitor.  His father and grandfather had hx of tremor.  He does not know if his grandfather ever got a dx and his father is still living, but is undiagnosed as far as he knows with any particular tremor type.  He was on prednisone around Christmas for flu (has hx of pulmonary sarcoid) and that didn't seem to make tremor worse.   Affected by caffeine:  yes, but decreased caffeine to one diet coke per day and one cup per day Affected by alcohol:  yes Affected by stress:  yes Affected by fatigue:  no (does seem worse around 1:30-2 pm) Spills soup if on spoon:  yes (intermittent) Spills glass of liquid if full:  no Affects ADL's (tying shoes, brushing teeth, etc):  no  Current/Previously tried tremor medications: inderal LA, 80  Current medications that may exacerbate tremor:  Not applicable  MRI brain 08/12/2012 was reviewed.  There was a rare punctate T2 hyperintensity noted.  Formal report is below.  Clinical Data:  Tremors and vertigo. Dizziness for 1-2 years.  Hypertension. Benign positional vertigo.  MRI HEAD WITHOUT AND WITH CONTRAST  Technique: Multiplanar, multiecho pulse sequences of the brain and  surrounding structures were obtained according to standard protocol  without and with intravenous contrast  Contrast: 20mL MULTIHANCE GADOBENATE DIMEGLUMINE 529 MG/ML IV SOLN  Comparison: None.  Findings: The patient had difficulty remaining motionless for the  study. Images are suboptimal. Small or subtle lesions could be  overlooked.  There is no evidence for acute infarction, intracranial hemorrhage,  mass lesion, hydrocephalus, or extra-axial fluid. Minimal  subcortical and periventricular white matter disease. No foci of  chronic hemorrhage. Post contrast, no abnormal enhancement of the  brain or meninges.  Major intracranial vessels structures are patent. Normal pituitary  and cerebellar tonsils. Upper cervical region unremarkable. No  worrisome osseous lesions. Negative orbits, sinuses, and mastoids.  IMPRESSION:  Minimal subcortical and periventricular white matter disease. No  acute intracranial abnormality.    Outside reports reviewed: historical medical records.  No Known Allergies  Current Outpatient Prescriptions on File Prior to Visit  Medication Sig Dispense Refill  . amLODipine (NORVASC) 5 MG tablet Take 1 tablet (5 mg total) by mouth daily.  30 tablet  5  . ascorbic acid (VITAMIN C) 500 MG tablet Take 500 mg by mouth daily.      .Marland Kitchen  aspirin 81 MG chewable tablet Chew 81 mg by mouth daily.        Marland Kitchen atorvastatin (LIPITOR) 40 MG tablet Take 1 tablet (40 mg total) by mouth daily.  30 tablet  11  . ibuprofen (ADVIL,MOTRIN) 800 MG tablet Take 800 mg by mouth every 8 (eight) hours as needed.        Marland Kitchen losartan (COZAAR) 100 MG tablet Take 1 tablet (100 mg total) by mouth daily.  30 tablet  1  . mometasone (NASONEX) 50 MCG/ACT nasal spray 2 sprays both side twice  17 g  11  .  propranolol ER (INDERAL LA) 80 MG 24 hr capsule Take 1 capsule (80 mg total) by mouth daily.  30 capsule  11  . ranitidine (ZANTAC) 150 MG capsule Take 150 mg by mouth at bedtime.         No current facility-administered medications on file prior to visit.    Past Medical History  Diagnosis Date  . Hyperlipidemia   . Sarcoidosis   . Depression   . HSV-1 (herpes simplex virus 1) infection   . Low back pain     Past Surgical History  Procedure Laterality Date  . Appendectomy    . Knee arthroplasty  1995    Lt  . Umbilical hernia repair      History   Social History  . Marital Status: Married    Spouse Name: N/A    Number of Children: N/A  . Years of Education: N/A   Occupational History  . Not on file.   Social History Main Topics  . Smoking status: Never Smoker   . Smokeless tobacco: Former Neurosurgeon    Types: Chew    Quit date: 08/28/1996  . Alcohol Use: Yes     Comment: only occ  . Drug Use: No  . Sexual Activity: Not on file   Other Topics Concern  . Not on file   Social History Narrative  . No narrative on file    Family Status  Relation Status Death Age  . Father Alive   . Sister Alive   . Mother Alive     Review of Systems A complete 10 system ROS was obtained and was negative apart from what is mentioned.   Objective:   VITALS:   Filed Vitals:   09/23/13 1007  BP: 132/88  Pulse: 84  Resp: 16  Height: 6\' 1"  (1.854 m)  Weight: 215 lb 3 oz (97.608 kg)   Gen:  Appears stated age and in NAD. HEENT:  Normocephalic, atraumatic. The mucous membranes are moist. The superficial temporal arteries are without ropiness or tenderness. Cardiovascular: Regular rate and rhythm. Lungs: Clear to auscultation bilaterally. Neck: There are no carotid bruits noted bilaterally.  NEUROLOGICAL:  Orientation:  The patient is alert and oriented x 3.  Recent and remote memory are intact.  Attention span and concentration are normal.  Able to name objects and repeat  without trouble.  Fund of knowledge is appropriate Cranial nerves: There is good facial symmetry. The pupils are equal round and reactive to light bilaterally. Fundoscopic exam reveals clear disc margins bilaterally. Extraocular muscles are intact and visual fields are full to confrontational testing. Speech is fluent and clear. Soft palate rises symmetrically and there is no tongue deviation. Hearing is intact to conversational tone. Tone: There is increased tone in the right upper extremity, versus paratonia. Sensation: Sensation is intact to light touch and pinprick throughout (facial, trunk, extremities). Vibration is intact at  the bilateral big toe. There is no extinction with double simultaneous stimulation. There is no sensory dermatomal level identified. Coordination:  The patient has very minimal difficulty with finger taps on the right hand toe taps on the left.  Otherwise, there was no difficulty with rapid alternating movements. Motor: Strength is 5/5 in the bilateral upper and lower extremities.  Shoulder shrug is equal bilaterally.  There is no pronator drift.  There are no fasciculations noted. DTR's: Deep tendon reflexes are 2-/4 at the bilateral biceps, triceps, brachioradialis, patella and achilles.  Plantar responses are downgoing bilaterally. Gait and Station: The patient is able to ambulate without difficulty. The patient had some difficulty ambulating in a tandem fashion.  He is able to heel toe walk.  He sways in the Romberg position with eyes closed, but is able to stand in that position.  MOVEMENT EXAM: Tremor:  There is tremor in the UE, noted with posture, more on the right than the left.  However, the tremor can also be felt in the resting position.  It is not more prominent with distraction.  Is not visible with distraction procedures.  LABS  Lab Results  Component Value Date   HGBA1C 5.2 06/11/2012   Lab Results  Component Value Date   VITAMINB12 373 09/09/2012    Lab Results  Component Value Date   TSH 1.051 09/09/2012        Assessment/Plan:   1.  Tremor.  -The fact that this started acutely is somewhat atypical, or he does report a fairly strong family history of tremor.  He is very worried about this as well as balance issues, although that can also come along with essential tremor.  It is believed that ET alone can cause ataxia/balance changes due to changes in the cerebellar purkinje cell/climbing fiber synaptic transmission.   However, my bigger concern and the reason I think that we should pursue a bigger work up is because of the paratonia and intermittent nature of the tremor.  This can happen with ET but deserves a further w/u.  -Given the fact that he is on 3 blood pressure agents, I will go ahead and order a 24-hour urine for catecholamines and metanephrines as well as a serum catecholamine.  This is very rare and probably low in the differential diagnosis, but something we should explore.  -I. will recheck his B12 since it has been fairly low (from a neurologic standpoint).  -I will do a 2 hour GTT since he reports worse in the mid afternoon.  -I am going to pursue a DaT scan given the tone in the R arm.  -If all the above it negative, I think that we can confidently say that this is ET.  He will call me after the DaT is complete to make a f/u appt.

## 2013-10-07 ENCOUNTER — Telehealth: Payer: Self-pay | Admitting: Neurology

## 2013-10-07 ENCOUNTER — Other Ambulatory Visit: Payer: BC Managed Care – PPO

## 2013-10-07 DIAGNOSIS — G250 Essential tremor: Secondary | ICD-10-CM

## 2013-10-07 DIAGNOSIS — I10 Essential (primary) hypertension: Secondary | ICD-10-CM

## 2013-10-07 DIAGNOSIS — R270 Ataxia, unspecified: Secondary | ICD-10-CM

## 2013-10-07 LAB — GLUCOSE TOLERANCE, 2 HOURS
GLUCOSE, 2 HOUR: 122 mg/dL
Glucose, 1 Hour GTT: 149 mg/dL
Glucose, Fasting: 92 mg/dL (ref 70–99)

## 2013-10-07 NOTE — Telephone Encounter (Signed)
Left message on machine for patient to call back. TO make patient aware of glucose test results. Awaiting call back.

## 2013-10-07 NOTE — Telephone Encounter (Signed)
Pt returned call at 2:09PM.

## 2013-10-07 NOTE — Telephone Encounter (Signed)
Patient made aware that glucose test normal. He has been missing calls with [facility] but is waiting on their returned call now to schedule DaT Scan.

## 2013-10-07 NOTE — Telephone Encounter (Signed)
Message copied by Silvio PateMCCRACKEN, Rupinder Livingston L on Tue Oct 07, 2013  1:35 PM ------      Message from: TAT, REBECCA S      Created: Tue Oct 07, 2013 11:51 AM       Please let pt know that his 2 hour GTT looked good. ------

## 2013-10-07 NOTE — Telephone Encounter (Signed)
Order for DaT [facility] fax[facility] on 09/23/2013 and 10/01/2013 with confirmation received at [phone]. Spoke with Lafonda MossesDiana at [facility] [phone]) who advised she has tried to contact patient x 2 and has not been able to reach him. She will keep trying and call me with appt date and time once this is scheduled.

## 2013-10-15 ENCOUNTER — Encounter: Payer: Self-pay | Admitting: Cardiovascular Disease

## 2013-10-16 ENCOUNTER — Telehealth: Payer: Self-pay | Admitting: Neurology

## 2013-10-16 DIAGNOSIS — I10 Essential (primary) hypertension: Secondary | ICD-10-CM

## 2013-10-16 DIAGNOSIS — G250 Essential tremor: Secondary | ICD-10-CM

## 2013-10-16 DIAGNOSIS — R270 Ataxia, unspecified: Secondary | ICD-10-CM

## 2013-10-16 NOTE — Telephone Encounter (Signed)
Called and requested results from [facility] - they are faxing results.

## 2013-10-16 NOTE — Telephone Encounter (Signed)
Lesly RubensteinJade, will you check on pts 24 hour urine.  The plasma catecholamines came back just slightly abnormal and of doubtful clinical significance but still awaiting the urine.

## 2013-10-16 NOTE — Telephone Encounter (Signed)
Received lab for plasma catecholamines. Called back and they state they do not have 24 hour urine results. Called patient to verify that he did turn in his 24 hour urine. LMOM for him to call back.

## 2013-10-17 NOTE — Telephone Encounter (Signed)
Spoke with patient. When he went to the lab before they never instructed him to collect 24 hour urine. Labs reordered and faxed to [facility]. Patient will go to have labs collected.

## 2013-10-20 LAB — VITAMIN B12: Vitamin B-12: 381 pg/mL (ref 211–946)

## 2013-10-22 ENCOUNTER — Telehealth: Payer: Self-pay | Admitting: Neurology

## 2013-10-22 NOTE — Telephone Encounter (Signed)
Message copied by Silvio PateMCCRACKEN, JADE L on Wed Oct 22, 2013 11:31 AM ------      Message from: TAT, REBECCA S      Created: Mon Oct 20, 2013  4:56 PM       Lesly RubensteinJade, have pt start taking b12 - 1000 mcg daily ------

## 2013-10-22 NOTE — Telephone Encounter (Signed)
Left message on machine for patient to call back.

## 2013-10-27 NOTE — Telephone Encounter (Signed)
Patient made aware. He will start vitamin b12 supplement. He has not had 24 hour urine drawn yet. He has been in [place] with a cousin in hospice. He plans to pick this up on Wednesday. We will follow up with these results.

## 2013-10-29 ENCOUNTER — Telehealth: Payer: Self-pay | Admitting: Neurology

## 2013-10-29 NOTE — Telephone Encounter (Signed)
Please let the pt know that I'm really happy to report that his DaT scan was normal and there was no evidence of any type of parkinsons disease contributing to tremor

## 2013-10-29 NOTE — Telephone Encounter (Signed)
Left message on machine for patient to call back.

## 2013-10-31 NOTE — Telephone Encounter (Signed)
Left message on machine for patient to call back.

## 2013-11-04 NOTE — Telephone Encounter (Signed)
Left message on machine for patient to call back.

## 2013-11-17 ENCOUNTER — Encounter: Payer: Self-pay | Admitting: Neurology

## 2013-11-18 ENCOUNTER — Other Ambulatory Visit: Payer: Self-pay | Admitting: Internal Medicine

## 2013-11-20 ENCOUNTER — Other Ambulatory Visit: Payer: Self-pay | Admitting: Neurology

## 2013-11-20 DIAGNOSIS — I10 Essential (primary) hypertension: Secondary | ICD-10-CM

## 2013-11-20 DIAGNOSIS — G250 Essential tremor: Secondary | ICD-10-CM

## 2013-11-20 DIAGNOSIS — R270 Ataxia, unspecified: Secondary | ICD-10-CM

## 2013-11-21 LAB — CREATININE CLEARANCE, URINE, 24 HOUR
CREATININE 24H UR: 2144 mg/d — AB (ref 800–2000)
Creatinine Clearance: 147 mL/min — ABNORMAL HIGH (ref 75–125)
Creatinine, Urine: 93.2 mg/dL
Creatinine: 1.01 mg/dL (ref 0.50–1.35)

## 2013-11-24 LAB — CATECHOLAMINES, FRACTIONATED, URINE, 24 HOUR
CALCULATED TOTAL (E+ NE): 54 ug/(24.h) (ref 26–121)
Creatinine, Urine mg/day-CATEUR: 1.71 g/(24.h) (ref 0.63–2.50)
Dopamine, 24 hr Urine: 436 mcg/24 h (ref 52–480)
Norepinephrine, 24 hr Ur: 54 mcg/24 h (ref 15–100)
Total Volume - CF 24Hr U: 2300 mL

## 2013-11-25 ENCOUNTER — Telehealth: Payer: Self-pay | Admitting: Neurology

## 2013-11-25 NOTE — Telephone Encounter (Signed)
Message copied by Silvio PateMCCRACKEN, JADE L on Tue Nov 25, 2013  8:41 AM ------      Message from: TAT, REBECCA S      Created: Mon Nov 24, 2013  3:11 PM       Jade, let pt know that 24 hour urine looked good. ------

## 2013-11-25 NOTE — Telephone Encounter (Signed)
Left message on machine for patient to call back. TO give him lab results. Awaiting call back.

## 2013-11-26 LAB — METANEPHRINES, URINE, 24 HOUR
METANEPH TOTAL UR: 466 ug/(24.h) (ref 182–739)
Metanephrines, Ur: 119 mcg/24 h (ref 58–203)
Normetanephrine, 24H Ur: 347 mcg/24 h (ref 88–649)

## 2013-11-27 NOTE — Telephone Encounter (Signed)
Spoke with patient and made him aware labs okay. He was also make aware DaT scan looked okay. Follow up made with Dr Tat to discuss testing/ follow up tremor.

## 2013-12-16 ENCOUNTER — Ambulatory Visit: Payer: BC Managed Care – PPO | Admitting: Neurology

## 2014-01-30 ENCOUNTER — Other Ambulatory Visit: Payer: Self-pay | Admitting: Cardiovascular Disease

## 2014-02-18 ENCOUNTER — Telehealth: Payer: Self-pay | Admitting: Neurology

## 2014-02-18 NOTE — Telephone Encounter (Signed)
Dr. Arbutus Leasat ordered tests at [facility]. Pt says insurance will not pay for them b/c they consider them experimental. Pt wants an appeal letter to submit insurance. CB# [phone] / Sherri S.

## 2014-02-18 NOTE — Telephone Encounter (Signed)
Patient needs this at the beginning of the week if possible.

## 2014-02-23 NOTE — Telephone Encounter (Signed)
Dr Tat - is this something we would do?

## 2014-02-23 NOTE — Telephone Encounter (Signed)
Janelle - do we have a prior authorization on this?

## 2014-02-23 NOTE — Telephone Encounter (Signed)
I can write a letter but did we have prior auth?  Also, can you call our DaT rep and see if they have a appeal letter already generated re: fact that it is not experimental?

## 2014-02-23 NOTE — Telephone Encounter (Signed)
Spoke with GardnersJanelle and test never reached her work queue. An email was sent to DaT scan rep to see if she has an appeal letter template to help patient with his insurance appeal.

## 2014-02-26 NOTE — Telephone Encounter (Signed)
I have reached out to some contacts at [facility] asking if they have an appeal letter.  Can you call the movement social worker at [facility] and ask her as well.  If not, then I will write something.  Let me know what you find out.

## 2014-02-26 NOTE — Telephone Encounter (Signed)
DaT scan representative states there is not a template for an appeal letter and [employer] will most likely not cover this, even after an appeal. Dr Tat - Is this something you can write?

## 2014-03-02 ENCOUNTER — Telehealth: Payer: Self-pay | Admitting: Neurology

## 2014-03-02 NOTE — Telephone Encounter (Signed)
Pt calld f/u on the letter he needs for his insurance to pay for his CAT SCAN at [facility]. Please call Pt and f/u with him.  C/B [phone]

## 2014-03-02 NOTE — Telephone Encounter (Signed)
Patient made aware we are working on this.

## 2014-03-02 NOTE — Telephone Encounter (Signed)
Message sent to Arlene at [facility].

## 2014-03-03 ENCOUNTER — Encounter: Payer: Self-pay | Admitting: Neurology

## 2014-03-04 ENCOUNTER — Telehealth: Payer: Self-pay | Admitting: Neurology

## 2014-03-04 NOTE — Telephone Encounter (Signed)
Patient aware letter complete. At front for pick up per patient request.

## 2014-03-04 NOTE — Telephone Encounter (Signed)
Pt returning your call please call [phone]

## 2014-03-04 NOTE — Telephone Encounter (Signed)
Appeal letter completed.

## 2014-03-04 NOTE — Telephone Encounter (Signed)
LMOM for patient to call back. To make him aware that appeal letter is ready and to see how he would like me to get this to him.

## 2014-03-05 ENCOUNTER — Ambulatory Visit (INDEPENDENT_AMBULATORY_CARE_PROVIDER_SITE_OTHER): Payer: BC Managed Care – PPO | Admitting: Neurology

## 2014-03-05 ENCOUNTER — Encounter: Payer: Self-pay | Admitting: Neurology

## 2014-03-05 VITALS — BP 140/90 | HR 68 | Resp 14 | Ht 73.0 in | Wt 225.0 lb

## 2014-03-05 DIAGNOSIS — G252 Other specified forms of tremor: Secondary | ICD-10-CM

## 2014-03-05 DIAGNOSIS — G250 Essential tremor: Secondary | ICD-10-CM

## 2014-03-05 MED ORDER — PROPRANOLOL HCL ER 80 MG PO CP24: 80.0000 mg | ORAL_CAPSULE | Freq: Every day | ORAL | Status: DC

## 2014-03-05 NOTE — Progress Notes (Signed)
Subjective:    Charles Villarreal was seen in consultation in the movement disorder clinic at the request of Dr. Eden Emms.  His PCP is Margaree Mackintosh, MD.  The evaluation is for tremor.  He was previously seen at [facility] for the same.  I reviewed those records via Care Everywhere.   The pt states that the first sx started about 2 years ago and it was an acute episode of loss of balance and dizziness.  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  He was dizzy and off balance for about 2 hours.  Admittedly, it was very hot outside.  Not long thereafter, he would note intermittent trouble writing because of tremor.  He would note tremor with other activites as well, such as with drinking out of a cup or using the arm.  This has gotten better with diet and lifestyle modifications (less caffeine, less alcohol, more exercise, adding propranolol).  The tremor can be R or L and is independent of activity.  He was started on propranolol by Dr. Lenord Fellers for the tremor and BP.  He went to [facility] and had wore a BP monitor.  His father and grandfather had hx of tremor.  He does not know if his grandfather ever got a dx and his father is still living, but is undiagnosed as far as he knows with any particular tremor type.  He was on prednisone around Christmas for flu (has hx of pulmonary sarcoid) and that didn't seem to make tremor worse.   Affected by caffeine:  yes, but decreased caffeine to one diet coke per day and one cup per day Affected by alcohol:  yes Affected by stress:  yes Affected by fatigue:  no (does seem worse around 1:30-2 pm) Spills soup if on spoon:  yes (intermittent) Spills glass of liquid if full:  no Affects ADL's (tying shoes, brushing teeth, etc):  no  03/05/14 update:  The patient was seen back in followup today.  He has had several tests in regards to his tremor, but also in regards to the fact that he had some concern in features on his physical exam or worrisome for a possible parkinsonian states,  including loss of balance, dizziness and elevated tone on the right.  Fortunately, his DaT scan done since last visit was unremarkable.  Unfortunately, his insurance does not pay for the scan.  I have written an appeal letter as this was completely within indications.  He did have other tests done since last visit.  His 24-hour urine for metanephrine test negative.  His 2 hour glucose tolerance test was unremarkable.  B12 was mildly low at 381.  He had plasma catecholamines drawn.  Norepinephrine was normal, as is dopamine.  Epinephrine was slightly elevated, but I think that this was just incidental.  Pt reports that clinically he is feeling much better.  Dizziness is virtually gone.  He only has this 1-2 times per month and it was daily.  He is also exercising every single day in the morning now, which has helped.  He thinks that the vitamin B12 and vitamin C have given him some energy, along with the exercises.  While his blood pressure was somewhat elevated today (states that he was almost late to get here), his blood pressure has [name] control.  He has gotten a fit bit and has become somewhat competitive with his family regarding this.  This has turned out to be a positive thing.  Current/Previously tried tremor medications: inderal  LA, 80  Current medications that may exacerbate tremor:  Not applicable  MRI brain 08/12/2012 was reviewed.  There was a rare punctate T2 hyperintensity noted.  Formal report is below.  Clinical Data: Tremors and vertigo. Dizziness for 1-2 years.  Hypertension. Benign positional vertigo.  MRI HEAD WITHOUT AND WITH CONTRAST  Technique: Multiplanar, multiecho pulse sequences of the brain and  surrounding structures were obtained according to standard protocol  without and with intravenous contrast  Contrast: 20mL MULTIHANCE GADOBENATE DIMEGLUMINE 529 MG/ML IV SOLN  Comparison: None.  Findings: The patient had difficulty remaining motionless for the  study. Images  are suboptimal. Small or subtle lesions could be  overlooked.  There is no evidence for acute infarction, intracranial hemorrhage,  mass lesion, hydrocephalus, or extra-axial fluid. Minimal  subcortical and periventricular white matter disease. No foci of  chronic hemorrhage. Post contrast, no abnormal enhancement of the  brain or meninges.  Major intracranial vessels structures are patent. Normal pituitary  and cerebellar tonsils. Upper cervical region unremarkable. No  worrisome osseous lesions. Negative orbits, sinuses, and mastoids.  IMPRESSION:  Minimal subcortical and periventricular white matter disease. No  acute intracranial abnormality.    Outside reports reviewed: historical medical records.  No Known Allergies  Current Outpatient Prescriptions on File Prior to Visit  Medication Sig Dispense Refill  . amLODipine (NORVASC) 5 MG tablet Take 1 tablet (5 mg total) by mouth daily.  30 tablet  5  . ascorbic acid (VITAMIN C) 500 MG tablet Take 500 mg by mouth daily.      Marland Kitchen. aspirin 81 MG chewable tablet Chew 81 mg by mouth daily.        Marland Kitchen. atorvastatin (LIPITOR) 40 MG tablet Take 1 tablet (40 mg total) by mouth daily.  30 tablet  11  . ibuprofen (ADVIL,MOTRIN) 800 MG tablet Take 800 mg by mouth every 8 (eight) hours as needed.        Marland Kitchen. losartan (COZAAR) 100 MG tablet TAKE ONE TABLET BY MOUTH ONCE DAILY  30 tablet  1  . NASONEX 50 MCG/ACT nasal spray TWO SPRAYS IN EACH NOSTRIL ONCE DAILY  17 g  5  . propranolol ER (INDERAL LA) 80 MG 24 hr capsule Take 1 capsule (80 mg total) by mouth daily.  30 capsule  11  . ranitidine (ZANTAC) 150 MG capsule Take 150 mg by mouth at bedtime.         No current facility-administered medications on file prior to visit.    Past Medical History  Diagnosis Date  . Hyperlipidemia   . Sarcoidosis   . Depression   . HSV-1 (herpes simplex virus 1) infection   . Low back pain     Past Surgical History  Procedure Laterality Date  . Appendectomy      . Knee arthroplasty  1995    Lt  . Umbilical hernia repair      History   Social History  . Marital Status: Married    Spouse Name: N/A    Number of Children: N/A  . Years of Education: N/A   Occupational History  . Not on file.   Social History Main Topics  . Smoking status: Never Smoker   . Smokeless tobacco: Former NeurosurgeonUser    Types: Chew    Quit date: 08/28/1996  . Alcohol Use: Yes     Comment: only occ  . Drug Use: No  . Sexual Activity: Not on file   Other Topics Concern  . Not on file  Social History Narrative  . No narrative on file    Family Status  Relation Status Death Age  . Father Alive   . Sister Alive   . Mother Alive     Review of Systems A complete 10 system ROS was obtained and was negative apart from what is mentioned.   Objective:   VITALS:   Filed Vitals:   03/05/14 1407  BP: 140/90  Pulse: 68  Resp: 14  Height: 6\' 1"  (1.854 m)  Weight: 225 lb (102.059 kg)   Gen:  Appears stated age and in NAD. HEENT:  Normocephalic, atraumatic. The mucous membranes are moist. The superficial temporal arteries are without ropiness or tenderness. Cardiovascular: Regular rate and rhythm. Lungs: Clear to auscultation bilaterally. Neck: There are no carotid bruits noted bilaterally.  NEUROLOGICAL:  Orientation:  The patient is alert and oriented x 3.  Recent and remote memory are intact.  Attention span and concentration are normal.  Able to name objects and repeat without trouble.  Fund of knowledge is appropriate Cranial nerves: There is good facial symmetry.  Speech is fluent and clear. Soft palate rises symmetrically and there is no tongue deviation. Hearing is intact to conversational tone. Tone: There is normal tone today. Coordination:  The patient has good rapid alternating movements. Motor: Strength is 5/5 in the bilateral upper and lower extremities.  Shoulder shrug is equal bilaterally.  There is no pronator drift.  There are no  fasciculations noted. Gait and Station: The patient is able to ambulate without difficulty.   MOVEMENT EXAM: Tremor:  There is tremor in the UE, noted with posture, more on the right than the left.  However, the tremor can also be felt in the resting position.  It is not more prominent with distraction.  Is not visible with distraction procedures.  LABS  Lab Results  Component Value Date   HGBA1C 5.2 06/11/2012   Lab Results  Component Value Date   VITAMINB12 381 09/23/2013   Lab Results  Component Value Date   TSH 1.051 09/09/2012        Assessment/Plan:   1.  Tremor.  -The fact that this started acutely is somewhat atypical, but he does report a fairly strong family history of tremor.  He was worried about balance issues, but that has actually improved with exercise, and dizziness has also resolved.  It is believed that ET alone can cause ataxia/balance changes due to changes in the cerebellar purkinje cell/climbing fiber synaptic transmission.     -His DAT scan was negative.  -He is doing much better than he was 6 months ago, and feels that tremor is well controlled on Inderal LA, 80 mg daily.  -I will be happy to see him back if needed.  I provided him a prescription for the Inderal LA for the next year.  I wrote him an appeal letter that he picked up yesterday for his DAT scan.  He will let me know if I can do anything else to help him in that regard.

## 2014-04-11 ENCOUNTER — Other Ambulatory Visit: Payer: Self-pay | Admitting: Cardiovascular Disease

## 2014-05-14 ENCOUNTER — Other Ambulatory Visit: Payer: Self-pay | Admitting: Cardiovascular Disease

## 2014-06-15 ENCOUNTER — Ambulatory Visit (INDEPENDENT_AMBULATORY_CARE_PROVIDER_SITE_OTHER): Payer: BC Managed Care – PPO | Admitting: Internal Medicine

## 2014-06-15 ENCOUNTER — Encounter: Payer: Self-pay | Admitting: Internal Medicine

## 2014-06-15 VITALS — BP 138/90 | HR 64 | Temp 97.8°F | Wt 218.0 lb

## 2014-06-15 DIAGNOSIS — I10 Essential (primary) hypertension: Secondary | ICD-10-CM

## 2014-06-15 DIAGNOSIS — G250 Essential tremor: Secondary | ICD-10-CM

## 2014-06-15 DIAGNOSIS — F411 Generalized anxiety disorder: Secondary | ICD-10-CM

## 2014-06-15 MED ORDER — LOSARTAN POTASSIUM 100 MG PO TABS: ORAL_TABLET | ORAL | Status: DC

## 2014-06-15 MED ORDER — ALPRAZOLAM 0.5 MG PO TABS: 0.5000 mg | ORAL_TABLET | Freq: Two times a day (BID) | ORAL | Status: DC | PRN

## 2014-06-15 MED ORDER — TADALAFIL 5 MG PO TABS: 5.0000 mg | ORAL_TABLET | Freq: Every day | ORAL | Status: DC | PRN

## 2014-06-15 MED ORDER — AMLODIPINE BESYLATE 5 MG PO TABS: ORAL_TABLET | ORAL | Status: DC

## 2014-06-15 NOTE — Patient Instructions (Signed)
Take Xanax twice daily as needed for anxiety. BP meds refilled.  Return in 6 weeks.

## 2014-07-14 ENCOUNTER — Other Ambulatory Visit: Payer: Self-pay | Admitting: Cardiovascular Disease

## 2014-07-16 ENCOUNTER — Encounter: Payer: Self-pay | Admitting: Internal Medicine

## 2014-07-16 ENCOUNTER — Ambulatory Visit (INDEPENDENT_AMBULATORY_CARE_PROVIDER_SITE_OTHER): Payer: BC Managed Care – PPO | Admitting: Internal Medicine

## 2014-07-16 VITALS — BP 128/90 | HR 58 | Temp 97.3°F | Ht 73.0 in | Wt 223.0 lb

## 2014-07-16 DIAGNOSIS — I10 Essential (primary) hypertension: Secondary | ICD-10-CM

## 2014-07-16 DIAGNOSIS — F411 Generalized anxiety disorder: Secondary | ICD-10-CM

## 2014-07-16 DIAGNOSIS — E785 Hyperlipidemia, unspecified: Secondary | ICD-10-CM

## 2014-07-16 DIAGNOSIS — G250 Essential tremor: Secondary | ICD-10-CM

## 2014-08-30 ENCOUNTER — Encounter: Payer: Self-pay | Admitting: Internal Medicine

## 2014-08-30 NOTE — Progress Notes (Signed)
   Subjective:    Patient ID: Charles Villarreal, male    DOB: 1966-02-20, 49 y.o.   MRN: [id number]  HPI  Patient with history of sarcoidosis, essential tremor, and hypertension not seen since 2014 in today to discuss anxiety. He's under a lot of stress at work. Some issues with his father. He is concerned father may have some memory loss. Marriage is fine. Children are good.    Review of Systems     Objective:   Physical Exam  Not examined. Spent 25 minutes speaking with him about stress at work and allowing him to ventilate frustrations with work and relationship with father      Assessment & Plan:  Anxiety  Hypertension   History of sarcoidosis  Essential tremor-was evaluated by Dr. Arbutus Leas July 2015  Plan: Needs to have fasting lab work in the near future. Return in 6 weeks. Xanax 0.5 mg twice daily sparingly for anxiety. Refill blood pressure medications.

## 2014-09-04 ENCOUNTER — Other Ambulatory Visit: Payer: BC Managed Care – PPO | Admitting: Internal Medicine

## 2014-09-11 ENCOUNTER — Other Ambulatory Visit: Payer: Self-pay | Admitting: Internal Medicine

## 2014-09-12 ENCOUNTER — Other Ambulatory Visit: Payer: Self-pay | Admitting: Cardiovascular Disease

## 2014-10-08 ENCOUNTER — Other Ambulatory Visit: Payer: BLUE CROSS/BLUE SHIELD | Admitting: Internal Medicine

## 2014-10-08 DIAGNOSIS — Z79899 Other long term (current) drug therapy: Secondary | ICD-10-CM

## 2014-10-08 DIAGNOSIS — E785 Hyperlipidemia, unspecified: Secondary | ICD-10-CM

## 2014-10-09 ENCOUNTER — Telehealth: Payer: Self-pay | Admitting: *Deleted

## 2014-10-09 LAB — HEPATIC FUNCTION PANEL
ALBUMIN: 4.6 g/dL (ref 3.5–5.2)
ALK PHOS: 53 U/L (ref 39–117)
ALT: 49 U/L (ref 0–53)
AST: 36 U/L (ref 0–37)
BILIRUBIN TOTAL: 0.6 mg/dL (ref 0.2–1.2)
Bilirubin, Direct: 0.1 mg/dL (ref 0.0–0.3)
Indirect Bilirubin: 0.5 mg/dL (ref 0.2–1.2)
Total Protein: 7.7 g/dL (ref 6.0–8.3)

## 2014-10-09 LAB — LIPID PANEL
Cholesterol: 270 mg/dL — ABNORMAL HIGH (ref 0–200)
HDL: 44 mg/dL (ref >39)
LDL Cholesterol: 187 mg/dL — ABNORMAL HIGH (ref 0–99)
TRIGLYCERIDES: 195 mg/dL — AB (ref <150)
Total CHOL/HDL Ratio: 6.1 Ratio
VLDL: 39 mg/dL (ref 0–40)

## 2014-10-09 MED ORDER — ATORVASTATIN CALCIUM 40 MG PO TABS: ORAL_TABLET | ORAL | Status: DC

## 2014-10-09 NOTE — Telephone Encounter (Signed)
Spoke with patient reviewed recent  lipid panel results with patient. Patient states he had been out of his Lipitor and had not been taking it recently .Informed patient that refills had been sent to his pharmacy and he needed to take his medication as directed everyday. We will repeat Lipids in May. Patient verbalized understanding.

## 2014-10-24 ENCOUNTER — Encounter: Payer: Self-pay | Admitting: Internal Medicine

## 2014-10-24 MED ORDER — ATORVASTATIN CALCIUM 40 MG PO TABS: 40.0000 mg | ORAL_TABLET | Freq: Every day | ORAL | Status: DC

## 2014-10-24 NOTE — Patient Instructions (Signed)
Return in February for fasting lipid panel and liver functions. Continue to take medications as prescribed.

## 2014-10-24 NOTE — Progress Notes (Signed)
   Subjective:    Patient ID: Charles Villarreal, male    DOB: 05/10/1966, 49 y.o.   MRN: [id number]  HPI Was seen about a month ago having anxiety and Xanax was prescribed. This is helped a great deal. Kateri McUncle has cancer and that has been stressful. Business continues to be stressful. Needs to have fasting lipid panel liver functions in the near future. Is supposed to be taking Lipitor 40 mg daily. Blood pressure is stable.    Review of Systems     Objective:   Physical Exam   Spent 15 minutes speaking with patient about these issues     Assessment & Plan:  Situational stress  Anxiety  Hypertension  Hyperlipidemia  Plan: To return in about 3 months for fasting lipid panel and liver functions if compliant with medication.

## 2014-11-03 ENCOUNTER — Other Ambulatory Visit: Payer: Self-pay | Admitting: Internal Medicine

## 2014-11-03 NOTE — Telephone Encounter (Signed)
Refill once 

## 2014-12-08 ENCOUNTER — Other Ambulatory Visit: Payer: Self-pay | Admitting: Internal Medicine

## 2015-01-21 ENCOUNTER — Other Ambulatory Visit: Payer: Self-pay | Admitting: Internal Medicine

## 2015-01-22 ENCOUNTER — Encounter: Payer: Self-pay | Admitting: Internal Medicine

## 2015-02-01 ENCOUNTER — Other Ambulatory Visit: Payer: Self-pay | Admitting: Internal Medicine

## 2015-02-02 ENCOUNTER — Other Ambulatory Visit: Payer: Self-pay | Admitting: Internal Medicine

## 2015-02-08 ENCOUNTER — Encounter: Payer: Self-pay | Admitting: Internal Medicine

## 2015-02-18 ENCOUNTER — Other Ambulatory Visit: Payer: Self-pay | Admitting: Neurology

## 2015-02-19 NOTE — Telephone Encounter (Signed)
This patient has not been seen for about a year.  At his last visit you gave him this Rx for a year.  Do you want to refill or see him first?

## 2015-02-22 ENCOUNTER — Telehealth: Payer: Self-pay | Admitting: *Deleted

## 2015-02-22 ENCOUNTER — Other Ambulatory Visit: Payer: Self-pay | Admitting: *Deleted

## 2015-02-22 MED ORDER — PROPRANOLOL HCL ER 80 MG PO CP24: 80.0000 mg | ORAL_CAPSULE | Freq: Every day | ORAL | Status: DC

## 2015-02-22 NOTE — Telephone Encounter (Signed)
Patient called requesting a refill on Inderal.  He confirmed his appointment on Wednesday since he has not been seen in a year.  Will send Rx.

## 2015-02-23 ENCOUNTER — Other Ambulatory Visit: Payer: Self-pay | Admitting: Internal Medicine

## 2015-02-23 ENCOUNTER — Other Ambulatory Visit: Payer: 59 | Admitting: Internal Medicine

## 2015-02-23 DIAGNOSIS — Z125 Encounter for screening for malignant neoplasm of prostate: Secondary | ICD-10-CM

## 2015-02-23 DIAGNOSIS — Z13220 Encounter for screening for lipoid disorders: Secondary | ICD-10-CM

## 2015-02-23 DIAGNOSIS — Z0000 Encounter for general adult medical examination without abnormal findings: Secondary | ICD-10-CM

## 2015-02-23 LAB — CBC WITH DIFFERENTIAL/PLATELET
Basophils Absolute: 0 10*3/uL (ref 0.0–0.1)
Basophils Relative: 1 % (ref 0–1)
EOS ABS: 0.2 10*3/uL (ref 0.0–0.7)
Eosinophils Relative: 4 % (ref 0–5)
HEMATOCRIT: 44.2 % (ref 39.0–52.0)
HEMOGLOBIN: 14.7 g/dL (ref 13.0–17.0)
LYMPHS ABS: 1.3 10*3/uL (ref 0.7–4.0)
LYMPHS PCT: 27 % (ref 12–46)
MCH: 31.5 pg (ref 26.0–34.0)
MCHC: 33.3 g/dL (ref 30.0–36.0)
MCV: 94.6 fL (ref 78.0–100.0)
MONOS PCT: 13 % — AB (ref 3–12)
MPV: 9.7 fL (ref 8.6–12.4)
Monocytes Absolute: 0.6 10*3/uL (ref 0.1–1.0)
NEUTROS PCT: 55 % (ref 43–77)
Neutro Abs: 2.6 10*3/uL (ref 1.7–7.7)
Platelets: 239 10*3/uL (ref 150–400)
RBC: 4.67 MIL/uL (ref 4.22–5.81)
RDW: 12.5 % (ref 11.5–15.5)
WBC: 4.7 10*3/uL (ref 4.0–10.5)

## 2015-02-23 LAB — COMPLETE METABOLIC PANEL WITH GFR
ALBUMIN: 4.3 g/dL (ref 3.5–5.2)
ALK PHOS: 62 U/L (ref 39–117)
ALT: 38 U/L (ref 0–53)
AST: 39 U/L — ABNORMAL HIGH (ref 0–37)
BILIRUBIN TOTAL: 1.1 mg/dL (ref 0.2–1.2)
BUN: 14 mg/dL (ref 6–23)
CO2: 22 meq/L (ref 19–32)
Calcium: 9.2 mg/dL (ref 8.4–10.5)
Chloride: 102 mEq/L (ref 96–112)
Creat: 0.98 mg/dL (ref 0.50–1.35)
Glucose, Bld: 76 mg/dL (ref 70–99)
POTASSIUM: 4.5 meq/L (ref 3.5–5.3)
SODIUM: 138 meq/L (ref 135–145)
Total Protein: 7 g/dL (ref 6.0–8.3)

## 2015-02-23 LAB — LIPID PANEL
CHOL/HDL RATIO: 3.6 ratio
Cholesterol: 180 mg/dL (ref 0–200)
HDL: 50 mg/dL (ref >=40)
LDL CALC: 83 mg/dL (ref 0–99)
Triglycerides: 237 mg/dL — ABNORMAL HIGH (ref <150)
VLDL: 47 mg/dL — AB (ref 0–40)

## 2015-02-24 ENCOUNTER — Encounter: Payer: Self-pay | Admitting: Neurology

## 2015-02-24 ENCOUNTER — Ambulatory Visit (INDEPENDENT_AMBULATORY_CARE_PROVIDER_SITE_OTHER): Payer: 59 | Admitting: Neurology

## 2015-02-24 VITALS — BP 138/80 | HR 84 | Ht 73.0 in | Wt 221.0 lb

## 2015-02-24 DIAGNOSIS — G250 Essential tremor: Secondary | ICD-10-CM

## 2015-02-24 LAB — PSA: PSA: 1.81 ng/mL (ref <=4.00)

## 2015-02-24 NOTE — Progress Notes (Signed)
Subjective:    Charles Villarreal was seen in consultation in the movement disorder clinic at the request of Dr. Eden Emms.  His PCP is Margaree Mackintosh, MD.  The evaluation is for tremor.  He was previously seen at [facility] for the same.  I reviewed those records via Care Everywhere.   The pt states that the first sx started about 2 years ago and it was an acute episode of loss of balance and dizziness.  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  He was dizzy and off balance for about 2 hours.  Admittedly, it was very hot outside.  Not long thereafter, he would note intermittent trouble writing because of tremor.  He would note tremor with other activites as well, such as with drinking out of a cup or using the arm.  This has gotten better with diet and lifestyle modifications (less caffeine, less alcohol, more exercise, adding propranolol).  The tremor can be R or L and is independent of activity.  He was started on propranolol by Dr. Lenord Fellers for the tremor and BP.  He went to [facility] and had wore a BP monitor.  His father and grandfather had hx of tremor.  He does not know if his grandfather ever got a dx and his father is still living, but is undiagnosed as far as he knows with any particular tremor type.  He was on prednisone around Christmas for flu (has hx of pulmonary sarcoid) and that didn't seem to make tremor worse.   Affected by caffeine:  yes, but decreased caffeine to one diet coke per day and one cup per day Affected by alcohol:  yes Affected by stress:  yes Affected by fatigue:  no (does seem worse around 1:30-2 pm) Spills soup if on spoon:  yes (intermittent) Spills glass of liquid if full:  no Affects ADL's (tying shoes, brushing teeth, etc):  no  03/05/14 update:  The patient was seen back in followup today.  He has had several tests in regards to his tremor, but also in regards to the fact that he had some concern in features on his physical exam or worrisome for a possible parkinsonian states,  including loss of balance, dizziness and elevated tone on the right.  Fortunately, his DaT scan done since last visit was unremarkable.  Unfortunately, his insurance does not pay for the scan.  I have written an appeal letter as this was completely within indications.  He did have other tests done since last visit.  His 24-hour urine for metanephrine test negative.  His 2 hour glucose tolerance test was unremarkable.  B12 was mildly low at 381.  He had plasma catecholamines drawn.  Norepinephrine was normal, as is dopamine.  Epinephrine was slightly elevated, but I think that this was just incidental.  Pt reports that clinically he is feeling much better.  Dizziness is virtually gone.  He only has this 1-2 times per month and it was daily.  He is also exercising every single day in the morning now, which has helped.  He thinks that the vitamin B12 and vitamin C have given him some energy, along with the exercises.  While his blood pressure was somewhat elevated today (states that he was almost late to get here), his blood pressure has [name] control.  He has gotten a fit bit and has become somewhat competitive with his family regarding this.  This has turned out to be a positive thing.  02/23/14 update:  The patient  returns today for follow-up.  Overall, the patient states that he has done well over the year, until very recently.  He ran out of the medication and now has noticed an increase in tremor.  Once he made a follow-up here, he was given a refill on the medication and restarted it on Monday so he has only been back on it for 2 days.  His tremor is improving but he still is noticing more than he originally was.  Otherwise, the patient states that he is doing well and has no new medical problems.  He does state that he is exercising more.  Current/Previously tried tremor medications: inderal LA, 80  Current medications that may exacerbate tremor:  Not applicable  MRI brain 08/12/2012 was reviewed.   There was a rare punctate T2 hyperintensity noted.  Formal report is below.  Clinical Data: Tremors and vertigo. Dizziness for 1-2 years.  Hypertension. Benign positional vertigo.  MRI HEAD WITHOUT AND WITH CONTRAST  Technique: Multiplanar, multiecho pulse sequences of the brain and  surrounding structures were obtained according to standard protocol  without and with intravenous contrast  Contrast: 20mL MULTIHANCE GADOBENATE DIMEGLUMINE 529 MG/ML IV SOLN  Comparison: None.  Findings: The patient had difficulty remaining motionless for the  study. Images are suboptimal. Small or subtle lesions could be  overlooked.  There is no evidence for acute infarction, intracranial hemorrhage,  mass lesion, hydrocephalus, or extra-axial fluid. Minimal  subcortical and periventricular white matter disease. No foci of  chronic hemorrhage. Post contrast, no abnormal enhancement of the  brain or meninges.  Major intracranial vessels structures are patent. Normal pituitary  and cerebellar tonsils. Upper cervical region unremarkable. No  worrisome osseous lesions. Negative orbits, sinuses, and mastoids.  IMPRESSION:  Minimal subcortical and periventricular white matter disease. No  acute intracranial abnormality.    Outside reports reviewed: historical medical records.  No Known Allergies  Current Outpatient Prescriptions on File Prior to Visit  Medication Sig Dispense Refill  . ALPRAZolam (XANAX) 0.5 MG tablet TAKE ONE TABLET TWICE DAILY AS NEEDED FOR ANXIETY 30 tablet 0  . amLODipine (NORVASC) 5 MG tablet TAKE ONE TABLET EACH DAY 30 tablet 0  . ascorbic acid (VITAMIN C) 500 MG tablet Take 500 mg by mouth daily.    Marland Kitchen atorvastatin (LIPITOR) 40 MG tablet Take 1 tablet (40 mg total) by mouth daily. 30 tablet 1  . ibuprofen (ADVIL,MOTRIN) 800 MG tablet Take 800 mg by mouth every 8 (eight) hours as needed.      . propranolol ER (INDERAL LA) 80 MG 24 hr capsule Take 1 capsule (80 mg total) by mouth  daily. 90 capsule 3  . ranitidine (ZANTAC) 150 MG capsule Take 150 mg by mouth at bedtime.       No current facility-administered medications on file prior to visit.    Past Medical History  Diagnosis Date  . Hyperlipidemia   . Sarcoidosis   . Depression   . HSV-1 (herpes simplex virus 1) infection   . Low back pain     Past Surgical History  Procedure Laterality Date  . Appendectomy    . Knee arthroplasty  1995    Lt  . Umbilical hernia repair      History   Social History  . Marital Status: Married    Spouse Name: N/A  . Number of Children: N/A  . Years of Education: N/A   Occupational History  . Not on file.   Social History Main Topics  .  Smoking status: Never Smoker   . Smokeless tobacco: Former NeurosurgeonUser    Types: Chew    Quit date: 08/28/1996  . Alcohol Use: Yes     Comment: only occ  . Drug Use: No  . Sexual Activity: Not on file   Other Topics Concern  . Not on file   Social History Narrative    Family Status  Relation Status Death Age  . Father Alive   . Sister Alive   . Mother Alive     Review of Systems A complete 10 system ROS was obtained and was negative apart from what is mentioned.   Objective:   VITALS:   Filed Vitals:   02/24/15 1419  BP: 138/80  Pulse: 84  Height: 6\' 1"  (1.854 m)  Weight: 221 lb (100.245 kg)   Wt Readings from Last 3 Encounters:  02/24/15 221 lb (100.245 kg)  07/16/14 223 lb (101.152 kg)  06/15/14 218 lb (98.884 kg)    Gen:  Appears stated age and in NAD. HEENT:  Normocephalic, atraumatic. The mucous membranes are moist. The superficial temporal arteries are without ropiness or tenderness. Cardiovascular: Regular rate and rhythm. Lungs: Clear to auscultation bilaterally. Neck: There are no carotid bruits noted bilaterally.  NEUROLOGICAL:  Orientation:  The patient is alert and oriented x 3.  Recent and remote memory are intact.  Attention span and concentration are normal.  Able to name objects and  repeat without trouble.  Fund of knowledge is appropriate Cranial nerves: There is good facial symmetry.  Speech is fluent and clear. Soft palate rises symmetrically and there is no tongue deviation. Hearing is intact to conversational tone. Tone: There is normal tone today. Coordination:  The patient has good rapid alternating movements. Motor: Strength is 5/5 in the bilateral upper and lower extremities.  Shoulder shrug is equal bilaterally.  There is no pronator drift.  There are no fasciculations noted. Gait and Station: The patient is able to ambulate without difficulty.   MOVEMENT EXAM: Tremor:  There is increased tremor today with posture and with intention, overall moderate in nature.  Archimedes spirals are about the same as last year (maybe little worse)  LABS  Lab Results  Component Value Date   HGBA1C 5.2 06/11/2012   Lab Results  Component Value Date   VITAMINB12 381 09/23/2013   Lab Results  Component Value Date   TSH 1.051 09/09/2012        Assessment/Plan:   1.  Essential Tremor.  -The fact that this started acutely is somewhat atypical, but he does report a fairly strong family history of tremor.    -His DAT scan was negative.  -His tremor was increased today, but he just got started back on his Inderal LA, 80 mg.  I told him that if tremor does not decrease within the next week, then he should call me back and we will increase Inderal LA to 120 mg. 2.  Follow-up in one year.

## 2015-02-25 ENCOUNTER — Encounter: Payer: Self-pay | Admitting: Internal Medicine

## 2015-02-25 ENCOUNTER — Ambulatory Visit (INDEPENDENT_AMBULATORY_CARE_PROVIDER_SITE_OTHER): Payer: 59 | Admitting: Internal Medicine

## 2015-02-25 VITALS — BP 138/82 | HR 67 | Temp 97.9°F | Ht 73.0 in | Wt 219.0 lb

## 2015-02-25 DIAGNOSIS — F411 Generalized anxiety disorder: Secondary | ICD-10-CM

## 2015-02-25 DIAGNOSIS — E785 Hyperlipidemia, unspecified: Secondary | ICD-10-CM | POA: Diagnosis not present

## 2015-02-25 DIAGNOSIS — I10 Essential (primary) hypertension: Secondary | ICD-10-CM | POA: Diagnosis not present

## 2015-02-25 DIAGNOSIS — L0293 Carbuncle, unspecified: Secondary | ICD-10-CM | POA: Diagnosis not present

## 2015-02-25 DIAGNOSIS — D869 Sarcoidosis, unspecified: Secondary | ICD-10-CM | POA: Diagnosis not present

## 2015-02-25 DIAGNOSIS — H8110 Benign paroxysmal vertigo, unspecified ear: Secondary | ICD-10-CM | POA: Diagnosis not present

## 2015-02-25 DIAGNOSIS — Z0000 Encounter for general adult medical examination without abnormal findings: Secondary | ICD-10-CM

## 2015-02-25 DIAGNOSIS — G250 Essential tremor: Secondary | ICD-10-CM | POA: Diagnosis not present

## 2015-02-25 LAB — POCT URINALYSIS DIPSTICK
Bilirubin, UA: NEGATIVE
Blood, UA: NEGATIVE
GLUCOSE UA: NEGATIVE
Ketones, UA: NEGATIVE
LEUKOCYTES UA: NEGATIVE
Nitrite, UA: NEGATIVE
PH UA: 5
PROTEIN UA: NEGATIVE
Spec Grav, UA: 1.015
Urobilinogen, UA: NEGATIVE

## 2015-02-25 LAB — HEMOGLOBIN A1C
Hgb A1c MFr Bld: 5.5 % (ref <5.7)
Mean Plasma Glucose: 111 mg/dL (ref <117)

## 2015-02-25 MED ORDER — ATORVASTATIN CALCIUM 40 MG PO TABS: 40.0000 mg | ORAL_TABLET | Freq: Every day | ORAL | Status: DC

## 2015-02-25 MED ORDER — AMLODIPINE BESYLATE 5 MG PO TABS: ORAL_TABLET | ORAL | Status: DC

## 2015-02-25 MED ORDER — CEPHALEXIN 500 MG PO CAPS: 500.0000 mg | ORAL_CAPSULE | Freq: Four times a day (QID) | ORAL | Status: DC

## 2015-02-25 MED ORDER — ALPRAZOLAM 0.5 MG PO TABS: ORAL_TABLET | ORAL | Status: DC

## 2015-02-25 MED ORDER — LOSARTAN POTASSIUM 100 MG PO TABS: 100.0000 mg | ORAL_TABLET | Freq: Every day | ORAL | Status: DC

## 2015-02-26 ENCOUNTER — Telehealth: Payer: Self-pay | Admitting: *Deleted

## 2015-02-26 NOTE — Telephone Encounter (Signed)
Left message with lab results on patient voice mail 

## 2015-03-09 ENCOUNTER — Telehealth: Payer: Self-pay | Admitting: Neurology

## 2015-03-09 MED ORDER — PROPRANOLOL HCL ER 120 MG PO CP24: 120.0000 mg | ORAL_CAPSULE | Freq: Every day | ORAL | Status: DC

## 2015-03-09 NOTE — Telephone Encounter (Signed)
Spoke with patient - he states tremor no better. Last office note states if tremor no better to increase Inderal to 120 mg. He is okay with this plan, [place] the [place]. Inderal 120 mg - [facility].

## 2015-03-09 NOTE — Telephone Encounter (Signed)
Pt called and wanted to let Dr Tat know that the shakiness has not gotten any better/Dawn CB# [phone]

## 2015-03-16 ENCOUNTER — Ambulatory Visit (INDEPENDENT_AMBULATORY_CARE_PROVIDER_SITE_OTHER): Payer: 59 | Admitting: Sports Medicine

## 2015-03-16 ENCOUNTER — Encounter: Payer: Self-pay | Admitting: Sports Medicine

## 2015-03-16 VITALS — BP 138/85 | Ht 73.0 in | Wt 210.0 lb

## 2015-03-16 DIAGNOSIS — M25572 Pain in left ankle and joints of left foot: Secondary | ICD-10-CM | POA: Insufficient documentation

## 2015-03-16 NOTE — Patient Instructions (Addendum)
You have a small chip in your talar ankle bone. We want you to stabilize and strengthen your ankle so you have less inflammation.  Get inserts for running shoes; we suggest spenco brand with cushioned arch.  Dr. Jari SportsmanScholls inserts for dress shoes.  Do ankle exercises that we went over on the sheet.  Wear ankle support sleeve with activity.  Return in 2 months to reassess.

## 2015-03-16 NOTE — Progress Notes (Signed)
Subjective:     Patient ID: Charles GeraldsJohn P Villarreal, male   DOB: 05/12/1966, 49 y.o.   MRN: [id number]  HPI Charles Villarreal is a 49 yo male who presents for left ankle pain. He has had pain in his lateral left ankle for the past 6 months since he twisted his ankle. He inverted his ankle when he stepped into a pot-hole while walking his dog. He had swelling and bruising for several days. He iced it and wore an ace bandage for a few days. His swelling improved, but he continued to have pain over the anterio-lateral part of his ankle. A few weeks later, he also started having pain the the dorsal aspect of his foot above his 3rd digit. He does not have pain with running, but he has pain with walking and sometimes when he is at rest. There are periods of the time where the pain goes away, but it has not resolved completely.  Review of Systems Per HPI.    Objective:   Physical Exam Gen: NAD BP 138/85 mmHg  Ht 6\' 1"  (1.854 m)  Wt 210 lb (95.255 kg)  BMI 27.71 kg/m2  MSK:  L ankle:  Swelling noted in sinus tarsi and ant to lat malleolus No erythema, ecchymosis. Pain with dorsiflexion and eversion. . Pain with extreme plantarflexion. Negative anterior drawer sign. ROM for ankle was normal  Cavus type foot that drops on standing/ short First MT segment bilat    US Small calcific chip noted at lat talar dome Soft tissue swelling in sinus tarsi and around per teritus Per tendons in tact  Assessment:     See prob list    Plan:     See prob list

## 2015-03-16 NOTE — Assessment & Plan Note (Signed)
See pt instructions  We will try to treat this with HEP/ compression and arch support

## 2015-04-28 ENCOUNTER — Encounter: Payer: Self-pay | Admitting: Internal Medicine

## 2015-04-28 NOTE — Patient Instructions (Signed)
Take Keflex for carbuncle 500 mg 4 times daily for 10 days. Continue same medications and be consistent with lipid-lowering medicine. Return in 6 months. Xanax refilled

## 2015-04-28 NOTE — Progress Notes (Signed)
   Subjective:    Patient ID: Charles Villarreal, male    DOB: 1965/11/17, 49 y.o.   MRN: [id number]  HPI  49 year old Male in today for health maintenance exam. He has a history of hyperlipidemia, vertigo, essential tremor, sarcoidosis followed by Dr. Sherene Sires, and hypertension. History of anxiety.  No known drug allergies.  Past medical history: Left knee surgery 1982. Another left knee surgery 1985. Appendectomy 1985. Strep throat 2001. Sarcoidosis is stable.  He's been evaluated by a neurologist at [facility] with episodes of feeling faint for no apparent reason. Thought to have probable vasovagal syncope and benign positional vertigo.  Has seen [facility] for counseling in the past. History of anxiety depression in 1998.  Social history: He is married and has 3 daughters. 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 Does not smoke. Social alcohol consumption. At one point was drinking too much but seems to have that under control.  Family history: Sister with history of breast cancer. Mother with history of hypertension, hypothyroidism, diabetes mellitus. Father with history of valve replacement.    Review of Systems  Constitutional: Positive for fatigue.  Respiratory:       History of sarcoidosis basically asymptomatic  Cardiovascular: Negative for chest pain and palpitations.  Gastrointestinal: Negative.   Genitourinary:       Erectile dysfunction  Neurological:       Essential tremor  Psychiatric/Behavioral:       Anxiety related to business issues and relationship with father       Objective:   Physical Exam  Constitutional: He is oriented to person, place, and time. He appears well-developed and well-nourished. No distress.  HENT:  Head: Normocephalic and atraumatic.  Right Ear: External ear normal.  Left Ear: External ear normal.  Mouth/Throat: Oropharynx is clear and moist. No oropharyngeal exudate.  Eyes: Conjunctivae and EOM are normal. Pupils are equal, round, and reactive  to light. Right eye exhibits no discharge. Left eye exhibits no discharge. No scleral icterus.  Neck: No JVD present. No thyromegaly present.  Cardiovascular: Normal rate, regular rhythm, normal heart sounds and intact distal pulses.   No murmur heard. Pulmonary/Chest: Effort normal and breath sounds normal. No respiratory distress. He has no wheezes. He has no rales. He exhibits no tenderness.  Abdominal: Soft. Bowel sounds are normal. He exhibits no distension. There is no tenderness. There is no rebound and no guarding.  Genitourinary: Prostate normal.  Musculoskeletal: Normal range of motion.  Lymphadenopathy:    He has no cervical adenopathy.  Neurological: He is alert and oriented to person, place, and time. He has normal reflexes. No cranial nerve deficit.  Essential tremor no focal deficits  Skin: Skin is warm and dry. No rash noted. He is not diaphoretic.  Has carbuncle which will be treated with Keflex  Psychiatric: He has a normal mood and affect. His behavior is normal. Thought content normal.  Vitals reviewed.         Assessment & Plan:  Hyperlipidemia-triglycerides elevated to 37. Hemoglobin A1c done for screening purposes and has no evidence of diabetes. Result is 5.5%. Has not been taking lipid-lowering medication I don't think consistently. Needs to watch diet and exercise.  Essential hypertension-stable  Anxiety-treated sparingly with antianxiety medication  History of sarcoidosis  Erectile dysfunction-treated with when necessary Viagra/Cialis  History of vertigo-benign positional  Essential tremor-about the same  Plan: Continue same medications and return in 6 months. Needs screening colonoscopy.

## 2015-06-09 ENCOUNTER — Telehealth: Payer: Self-pay | Admitting: Neurology

## 2015-06-09 MED ORDER — PROPRANOLOL HCL ER 120 MG PO CP24: 120.0000 mg | ORAL_CAPSULE | Freq: Every day | ORAL | Status: DC

## 2015-06-09 NOTE — Telephone Encounter (Signed)
Propranolol refill requested. Per last office note- patient to remain on medication. Refill approved and sent to patient's pharmacy.   

## 2015-06-09 NOTE — Telephone Encounter (Signed)
Pt came in for a refill of/ Proprancolol ER 120mg / [facility]/their PH# is/ [phone]

## 2015-08-05 ENCOUNTER — Other Ambulatory Visit: Payer: 59 | Admitting: Internal Medicine

## 2015-08-05 DIAGNOSIS — E785 Hyperlipidemia, unspecified: Secondary | ICD-10-CM

## 2015-08-05 DIAGNOSIS — R7309 Other abnormal glucose: Secondary | ICD-10-CM

## 2015-08-05 DIAGNOSIS — Z79899 Other long term (current) drug therapy: Secondary | ICD-10-CM

## 2015-08-05 DIAGNOSIS — E782 Mixed hyperlipidemia: Secondary | ICD-10-CM

## 2015-08-05 DIAGNOSIS — I10 Essential (primary) hypertension: Secondary | ICD-10-CM

## 2015-08-05 LAB — LIPID PANEL
CHOLESTEROL: 218 mg/dL — AB (ref 125–200)
HDL: 46 mg/dL (ref >=40)
LDL Cholesterol: 104 mg/dL (ref <130)
Total CHOL/HDL Ratio: 4.7 Ratio (ref <=5.0)
Triglycerides: 339 mg/dL — ABNORMAL HIGH (ref <150)
VLDL: 68 mg/dL — ABNORMAL HIGH (ref <30)

## 2015-08-05 LAB — HEPATIC FUNCTION PANEL
ALT: 62 U/L — ABNORMAL HIGH (ref 9–46)
AST: 66 U/L — ABNORMAL HIGH (ref 10–35)
Albumin: 4.3 g/dL (ref 3.6–5.1)
Alkaline Phosphatase: 56 U/L (ref 40–115)
BILIRUBIN TOTAL: 0.8 mg/dL (ref 0.2–1.2)
Bilirubin, Direct: 0.1 mg/dL (ref <=0.2)
Indirect Bilirubin: 0.7 mg/dL (ref 0.2–1.2)
Total Protein: 7.4 g/dL (ref 6.1–8.1)

## 2015-08-06 ENCOUNTER — Ambulatory Visit (INDEPENDENT_AMBULATORY_CARE_PROVIDER_SITE_OTHER): Payer: 59 | Admitting: Internal Medicine

## 2015-08-06 VITALS — BP 138/85

## 2015-08-06 DIAGNOSIS — I10 Essential (primary) hypertension: Secondary | ICD-10-CM

## 2015-08-06 DIAGNOSIS — R748 Abnormal levels of other serum enzymes: Secondary | ICD-10-CM | POA: Diagnosis not present

## 2015-08-06 DIAGNOSIS — E781 Pure hyperglyceridemia: Secondary | ICD-10-CM | POA: Diagnosis not present

## 2015-08-06 DIAGNOSIS — F411 Generalized anxiety disorder: Secondary | ICD-10-CM

## 2015-08-06 LAB — HEMOGLOBIN A1C
Hgb A1c MFr Bld: 5.4 % (ref <5.7)
MEAN PLASMA GLUCOSE: 108 mg/dL (ref <117)

## 2015-09-06 ENCOUNTER — Other Ambulatory Visit: Payer: Self-pay | Admitting: Internal Medicine

## 2015-09-06 NOTE — Telephone Encounter (Signed)
Phoned both to pharmacy

## 2015-09-06 NOTE — Telephone Encounter (Signed)
Has appt late January. Refill each x 30 days only

## 2015-09-09 ENCOUNTER — Other Ambulatory Visit: Payer: Self-pay | Admitting: Internal Medicine

## 2015-09-23 ENCOUNTER — Other Ambulatory Visit: Payer: 59 | Admitting: Internal Medicine

## 2015-09-23 DIAGNOSIS — E785 Hyperlipidemia, unspecified: Secondary | ICD-10-CM

## 2015-09-23 DIAGNOSIS — Z79899 Other long term (current) drug therapy: Secondary | ICD-10-CM

## 2015-09-23 LAB — LIPID PANEL
CHOLESTEROL: 135 mg/dL (ref 125–200)
HDL: 30 mg/dL — AB (ref >=40)
LDL Cholesterol: 76 mg/dL (ref <130)
TRIGLYCERIDES: 146 mg/dL (ref <150)
Total CHOL/HDL Ratio: 4.5 Ratio (ref <=5.0)
VLDL: 29 mg/dL (ref <30)

## 2015-09-23 LAB — HEPATIC FUNCTION PANEL
ALT: 22 U/L (ref 9–46)
AST: 20 U/L (ref 10–35)
Albumin: 4.6 g/dL (ref 3.6–5.1)
Alkaline Phosphatase: 58 U/L (ref 40–115)
BILIRUBIN INDIRECT: 0.5 mg/dL (ref 0.2–1.2)
Bilirubin, Direct: 0.1 mg/dL (ref <=0.2)
TOTAL PROTEIN: 7.2 g/dL (ref 6.1–8.1)
Total Bilirubin: 0.6 mg/dL (ref 0.2–1.2)

## 2015-09-24 ENCOUNTER — Ambulatory Visit (INDEPENDENT_AMBULATORY_CARE_PROVIDER_SITE_OTHER): Payer: 59 | Admitting: Internal Medicine

## 2015-09-24 VITALS — BP 104/70 | HR 50 | Temp 97.9°F | Resp 20 | Wt 208.0 lb

## 2015-09-24 DIAGNOSIS — E785 Hyperlipidemia, unspecified: Secondary | ICD-10-CM

## 2015-09-24 DIAGNOSIS — Z862 Personal history of diseases of the blood and blood-forming organs and certain disorders involving the immune mechanism: Secondary | ICD-10-CM | POA: Diagnosis not present

## 2015-09-24 DIAGNOSIS — I10 Essential (primary) hypertension: Secondary | ICD-10-CM | POA: Diagnosis not present

## 2015-09-24 DIAGNOSIS — F411 Generalized anxiety disorder: Secondary | ICD-10-CM

## 2015-09-24 MED ORDER — LOSARTAN POTASSIUM 100 MG PO TABS: ORAL_TABLET | ORAL | Status: DC

## 2015-09-24 MED ORDER — AMLODIPINE BESYLATE 5 MG PO TABS: ORAL_TABLET | ORAL | Status: DC

## 2015-09-24 MED ORDER — ALPRAZOLAM 0.5 MG PO TABS: ORAL_TABLET | ORAL | Status: DC

## 2015-09-25 ENCOUNTER — Encounter: Payer: Self-pay | Admitting: Internal Medicine

## 2015-09-25 NOTE — Patient Instructions (Signed)
Please continue diet exercise and return in 6 months. Very pleased with results. It was a pleasure to see you today. Lab work is within normal limits.

## 2015-09-25 NOTE — Patient Instructions (Signed)
Decrease alcohol consumption. Watch diet and begin to exercise. Take medications as directed. Return in January for fasting lipid panel and liver functions as well as blood pressure check.

## 2015-09-25 NOTE — Progress Notes (Signed)
   Subjective:    Patient ID: Charles Villarreal, male    DOB: 1965-12-11, 50 y.o.   MRN: [id number]  HPI 50 year old White male in today for follow-up of hyperlipidemia. History of hypertension. In early December he was seen in total cholesterol was 218 with triglycerides of 339, VLDL of 68 and LDL of 104. In late December he completely quit drinking alcohol. He started exercising. Total cholesterol is now 135, triglycerides 146, VLDL 29 and LDL 76. Liver functions were elevated in early December. SGOT was 66 and SGPT was 62. These are now normal. He feels well.    Review of Systems     Objective:   Physical Exam  Skin warm and dry. Nodes none. Chest clear. Cardiac exam regular rate and rhythm. Extremities without edema.      Assessment & Plan:  Essential hypertension  Hyperlipidemia-improved off alcohol  Elevated liver functions-now normal off alcohol  Anxiety  History of sarcoidosis  Plan: Return in 6 months for physical examination.

## 2015-09-25 NOTE — Progress Notes (Signed)
   Subjective:    Patient ID: Charles Villarreal, male    DOB: [birth year], 50 y.o.   MRN: [id number]  HPI In today for office visit, blood pressure check and evaluation of lipids. Total cholesterol is 218, triglycerides 339. VLDL is 68. LDL cholesterol is 104. SGOT is 66 and SGPT 62. Says he does not drink alcohol every day but drinks alcohol several times a week but not to excess. Not exercising very much. History of anxiety. History of sarcoidosis.    Review of Systems     Objective:   Physical Exam  Skin warm and dry. Nodes none. Chest clear. Cardiac exam regular rate and rhythm. Extremities without edema.      Assessment & Plan:  Elevated liver functions-mild elevation of SGOT and SGPT  Elevated total cholesterol  Elevated triglycerides-significant elevation at 339. Patient was fasting.  Essential hypertension-slight elevation  Anxiety-treated with antianxiety medication  Plan: Encouraged patient to decrease alcohol consumption and return in 6 weeks for follow-up. At that time he'll have office visit, blood pressure check, lipid panel and liver functions drawn.

## 2015-10-06 ENCOUNTER — Other Ambulatory Visit: Payer: Self-pay | Admitting: Internal Medicine

## 2015-10-06 NOTE — Telephone Encounter (Signed)
Phoned to pharmacy 

## 2015-10-06 NOTE — Telephone Encounter (Signed)
Refill x 3 months 

## 2015-11-08 ENCOUNTER — Other Ambulatory Visit: Payer: Self-pay | Admitting: Internal Medicine

## 2015-11-29 ENCOUNTER — Ambulatory Visit (INDEPENDENT_AMBULATORY_CARE_PROVIDER_SITE_OTHER): Payer: 59 | Admitting: Sports Medicine

## 2015-11-29 ENCOUNTER — Encounter: Payer: Self-pay | Admitting: Sports Medicine

## 2015-11-29 VITALS — BP 118/51 | Ht 73.0 in | Wt 205.0 lb

## 2015-11-29 DIAGNOSIS — M1000 Idiopathic gout, unspecified site: Secondary | ICD-10-CM | POA: Diagnosis not present

## 2015-11-29 DIAGNOSIS — M109 Gout, unspecified: Secondary | ICD-10-CM | POA: Insufficient documentation

## 2015-11-29 MED ORDER — DICLOFENAC SODIUM 50 MG PO TBEC: 50.0000 mg | DELAYED_RELEASE_TABLET | Freq: Two times a day (BID) | ORAL | Status: DC

## 2015-11-29 NOTE — Patient Instructions (Signed)
Thanks for coming in today.   Take the Voltaren as below.   Day 1 - 300mg  ( 6 tabs) once Day 2 - 250mg  ( 5 tabs) once Day 3 - 250mg  ( 5 tabs) once Day 4 - 150 mg (3 tabs) once Day 5 - 150 mg (3 tabs) once Take 150mg  daily thereafter, if still having pain until pain is gone.  It is highly probable that this is a gout attack.   See your Primary Doctor for follow up and to check Uric Acid Levels.   Consider asking about Losartan (as this can help to lower uric acid) if you need a blood pressure medicine in the future.   Thanks for letting us take care of you.   Sincerely, Devota Pacealeb Danne Scardina, MD Family Medicine -PGY 2

## 2015-11-29 NOTE — Progress Notes (Signed)
[facility] [name], MD Phone: [phone]  Subjective:   # Left Great Toe Pain - Has been ongoing for 7 days now.  - Started abruptly.  - He woke up 7 days ago and his toe was acutely tender and he has had a hard time walking / standing on it or even putting on his shoe since that time.  - he has had swelling and warmth but no erythema over that area.  - He has not injured it, and has never had an injury of that area.  - He has not had any fever or chills.  - His foot is painful to walk on and he is on his feet for work all day.  - He has been taking  ibuprofen every 4.5 hours to help with the pain. This dulls the pain, but does not completely resolve it.  - he does not have a history of gout, nor does he have a family history of gout.  - He used to drink ETOH, but quit on New Years Eve 2016. He says that since quitting ETOH he has had a VERY poor diet and pretty much eats whatever he feels like.  - He is on several medications, but none for blood pressure at this time. He takes propranolol for Essential Tremor.    All relevant systems were reviewed and were negative unless otherwise noted in the HPI  Past Medical History Reviewed problem list.  Medications- reviewed and updated Current Outpatient Prescriptions  Medication Sig Dispense Refill  . ALPRAZolam (XANAX) 0.5 MG tablet TAKE ONE TABLET TWICE DAILY AS NEEDED FOR ANXIETY 60 tablet 2  . amLODipine (NORVASC) 5 MG tablet TAKE ONE TABLET EACH DAY 30 tablet 3  . ascorbic acid (VITAMIN C) 500 MG tablet Take 500 mg by mouth daily.    Marland Kitchen atorvastatin (LIPITOR) 40 MG tablet Take 1 tablet (40 mg total) by mouth daily. 30 tablet 5  . atorvastatin (LIPITOR) 40 MG tablet TAKE ONE TABLET EACH DAY 30 tablet 5  . diclofenac (VOLTAREN) 50 MG EC tablet Take 1 tablet (50 mg total) by mouth 2 (two) times daily. 30 tablet 11  . ibuprofen (ADVIL,MOTRIN) 800 MG tablet Take 800 mg by mouth every 8 (eight) hours  as needed.      Marland Kitchen losartan (COZAAR) 100 MG tablet TAKE ONE TABLET EACH DAY 30 tablet 5  . meclizine (ANTIVERT) 12.5 MG tablet Take 12.5 mg by mouth 3 (three) times daily as needed for dizziness.    . propranolol ER (INDERAL LA) 120 MG 24 hr capsule     . ranitidine (ZANTAC) 150 MG capsule Take 150 mg by mouth at bedtime.       No current facility-administered medications for this visit.   Chief complaint-noted No additions to family history Social history- patient is a non smoker  ROS No other joint swelling No loss of sensation in either foot  Objective: BP 118/51 mmHg  Ht  (1.854 m)  Wt 205 lb (92.987 kg)  BMI 27.05 kg/m2 Right Foot - INSPECTION: Swollen on the left compared to right, no erythema. No atrophy or acute deformity PALPATION: VERY tender to palpation over first MTP, positive sqeeze test of MTP's, limited ROM of first MTP due to tenderness, it is tender with very little palpation. Warmth is noted. All other MTP joints nontender to palpation or ROM RANGE OF MOTION: Limited at the first MTP, all others with FROM STRENGTH: No deficit noted.  SPECIAL  TESTS: None.  NEUROVASCULAR STATUS: 2+ DP, PT on the right. Sensation in tact.   Ultrasound: Visible effusion in first MTP with + snowflake pattern. Likely uric acid crystals vs. Calcium pyruvate in the effusion space. Otherwise normal ultrasound.    Assessment/Plan: Gout Initial presentation. Very likely gout with history, presentation, and ultrasound findings. No Uric Acid level at this time, and pt. Elected not to undergo aspiration of the joint space to identify crystals. Treating empirically. - Voltaren regimen to abort current gout episode. - Information on Gout diet given to pt.   - Needs f/u with pcp and Uric Acid level.  - Consider Losartan if needed for BP control in the future. BP within range at this time.  - F/U sports medicine as needed.

## 2015-11-29 NOTE — Assessment & Plan Note (Addendum)
Initial presentation. Very likely gout with history, presentation, and ultrasound findings. No Uric Acid level at this time, and pt. Elected not to undergo aspiration of the joint space to identify crystals. Treating empirically. - Voltaren regimen to abort current gout episode. - Information on Gout diet given to pt.   - Needs f/u with pcp and Uric Acid level.  - Consider Losartan if needed for BP control in the future. BP within range at this time.  - F/U sports medicine as needed.

## 2015-12-03 ENCOUNTER — Encounter: Payer: Self-pay | Admitting: Internal Medicine

## 2015-12-10 ENCOUNTER — Other Ambulatory Visit: Payer: Self-pay | Admitting: Neurology

## 2015-12-13 NOTE — Telephone Encounter (Signed)
Propranolol refill requested. Per last office note- patient to remain on medication. Refill approved and sent to patient's pharmacy.   

## 2016-02-05 ENCOUNTER — Other Ambulatory Visit: Payer: Self-pay | Admitting: Internal Medicine

## 2016-03-20 ENCOUNTER — Other Ambulatory Visit: Payer: 59 | Admitting: Internal Medicine

## 2016-03-20 DIAGNOSIS — E785 Hyperlipidemia, unspecified: Secondary | ICD-10-CM

## 2016-03-20 DIAGNOSIS — I10 Essential (primary) hypertension: Secondary | ICD-10-CM

## 2016-03-20 LAB — HEPATIC FUNCTION PANEL
ALBUMIN: 4.5 g/dL (ref 3.6–5.1)
ALK PHOS: 58 U/L (ref 40–115)
ALT: 29 U/L (ref 9–46)
AST: 27 U/L (ref 10–35)
BILIRUBIN DIRECT: 0.1 mg/dL (ref <=0.2)
BILIRUBIN TOTAL: 0.7 mg/dL (ref 0.2–1.2)
Indirect Bilirubin: 0.6 mg/dL (ref 0.2–1.2)
Total Protein: 7 g/dL (ref 6.1–8.1)

## 2016-03-20 LAB — LIPID PANEL
CHOLESTEROL: 170 mg/dL (ref 125–200)
HDL: 46 mg/dL (ref >=40)
LDL Cholesterol: 87 mg/dL (ref <130)
Total CHOL/HDL Ratio: 3.7 Ratio (ref <=5.0)
Triglycerides: 184 mg/dL — ABNORMAL HIGH (ref <150)
VLDL: 37 mg/dL — AB (ref <30)

## 2016-03-21 ENCOUNTER — Encounter: Payer: Self-pay | Admitting: Internal Medicine

## 2016-03-21 ENCOUNTER — Ambulatory Visit (INDEPENDENT_AMBULATORY_CARE_PROVIDER_SITE_OTHER): Payer: 59 | Admitting: Internal Medicine

## 2016-03-21 VITALS — BP 120/84 | HR 54 | Temp 98.1°F | Ht 73.0 in | Wt 218.0 lb

## 2016-03-21 DIAGNOSIS — F411 Generalized anxiety disorder: Secondary | ICD-10-CM | POA: Diagnosis not present

## 2016-03-21 DIAGNOSIS — D869 Sarcoidosis, unspecified: Secondary | ICD-10-CM | POA: Diagnosis not present

## 2016-03-21 DIAGNOSIS — I10 Essential (primary) hypertension: Secondary | ICD-10-CM | POA: Diagnosis not present

## 2016-03-21 DIAGNOSIS — G250 Essential tremor: Secondary | ICD-10-CM

## 2016-03-21 DIAGNOSIS — E785 Hyperlipidemia, unspecified: Secondary | ICD-10-CM | POA: Diagnosis not present

## 2016-03-21 MED ORDER — ATORVASTATIN CALCIUM 40 MG PO TABS: 40.0000 mg | ORAL_TABLET | Freq: Every day | ORAL | 5 refills | Status: DC

## 2016-03-21 MED ORDER — ALPRAZOLAM 0.5 MG PO TABS: ORAL_TABLET | ORAL | 2 refills | Status: DC

## 2016-03-21 MED ORDER — AMLODIPINE BESYLATE 5 MG PO TABS: ORAL_TABLET | ORAL | 5 refills | Status: DC

## 2016-03-21 MED ORDER — LOSARTAN POTASSIUM 100 MG PO TABS
ORAL_TABLET | ORAL | 5 refills | Status: DC
Start: 2016-03-21 — End: 2017-03-06

## 2016-03-21 NOTE — Progress Notes (Signed)
   Subjective:    Patient ID: Charles Villarreal, male    DOB: December 06, 1965, 50 y.o.   MRN: [id number]  HPI 50 year old  White Male here for follow up  on essential HTN, anxiety, and hyprerlipidemia.He has a history of sarcoidosis which essentially is asymptomatic. He is followed by Dr. Sherene Sires. Recently returned from trip to [place]. Has had some sinus congestion and says he'll make appointment to see Dr. Sherene Sires in the very near future. No fever. No cough.  Continues to be followed by Dr. Arbutus Leas for tremor.    Review of Systems see above     Objective:   Physical Exam  Neck: No thyromegaly present.  Cardiovascular: Normal rate, regular rhythm and normal heart sounds.   No murmur heard. Pulmonary/Chest: He has no wheezes. He has no rales.  Musculoskeletal: He exhibits no edema.  Vitals reviewed.         Assessment & Plan:   Essential hypertension stable on current regimen  Hyperlipidemia-lipid panel liver functions normal on statin therapy  Anxiety-Xanax refilled  History of sarcoidosis-to see Dr. Sherene Sires in the near future  History of tremor  Plan: Return in 6 months for physical exam and fasting lab work.

## 2016-03-22 ENCOUNTER — Encounter: Payer: Self-pay | Admitting: Internal Medicine

## 2016-03-22 NOTE — Patient Instructions (Signed)
It was a pleasure to see you today. Continue same medications and return in 6 months for physical exam. Xanax refilled by written prescription.

## 2016-04-12 ENCOUNTER — Other Ambulatory Visit: Payer: Self-pay | Admitting: Neurology

## 2016-04-19 NOTE — Progress Notes (Signed)
Subjective:    Charles Villarreal was seen in consultation in the movement disorder clinic at the request of [name].  His PCP is Charles Villarreal,Charles J, MD.  The evaluation is for tremor.  [facility]  I reviewed those records via Care Everywhere.   The pt states that the first sx started about 2 years ago and it was an acute episode of loss of balance and dizziness.  [place] [employer] [place]  He was dizzy and off balance for about 2 hours.  Admittedly, it was very hot outside.  Not long thereafter, he would note intermittent trouble writing because of tremor.  He would note tremor with other activites as well, such as with drinking out of a cup or using the arm.  This has gotten better with diet and lifestyle modifications (less caffeine, less alcohol, more exercise, adding propranolol).  The tremor can be R or L and is independent of activity.  He was started on propranolol by [name] for the tremor and BP.  [facility] and had wore a [facility] monitor.  His father and grandfather had hx of tremor.  He does not know if his grandfather ever got a dx and his father is still living, but is undiagnosed as far as he knows with any particular tremor type.  He was on prednisone around Christmas for flu (has hx of pulmonary sarcoid) and that didn't seem to make tremor worse.   Affected by caffeine:  yes, but decreased caffeine to one diet coke per day and one cup per day Affected by alcohol:  yes Affected by stress:  yes Affected by fatigue:  no (does seem worse around 1:30-2 pm) Spills soup if on spoon:  yes (intermittent) Spills glass of liquid if full:  no Affects ADL's (tying shoes, brushing teeth, etc):  no  03/05/14 update:  The patient was seen back in followup today.  He has had several tests in regards to his tremor, but also in regards to the fact that he had some concern in features on his physical exam or worrisome for a possible parkinsonian states,  including loss of balance, dizziness and elevated tone on the right.  Fortunately, his DaT scan done since last visit was unremarkable.  Unfortunately, his insurance does not pay for the scan.  I have written an appeal letter as this was completely within indications.  He did have other tests done since last visit.  His 24-hour urine for metanephrine test negative.  His 2 hour glucose tolerance test was unremarkable.  B12 was mildly low at 381.  He had plasma catecholamines drawn.  Norepinephrine was normal, as is dopamine.  Epinephrine was slightly elevated, but I think that this was just incidental.  Pt reports that clinically he is feeling much better.  Dizziness is virtually gone.  He only has this 1-2 times per month and it was daily.  He is also exercising every single day in the morning now, which has helped.  He thinks that the vitamin B12 and vitamin C have given him some energy, along with the exercises.  While his blood pressure was somewhat elevated today (states that he was almost late to get here), his blood pressure has [name] control.  He has gotten a fit bit and has become somewhat competitive with his family regarding this.  This has turned out to be a positive thing.  02/24/15 update:  The patient  returns today for follow-up.  Overall, the patient states that he has done well over the year, until very recently.  He ran out of the medication and now has noticed an increase in tremor.  Once he made a follow-up here, he was given a refill on the medication and restarted it on Monday so he has only been back on it for 2 days.  His tremor is improving but he still is noticing more than he originally was.  Otherwise, the patient states that he is doing well and has no new medical problems.  He does state that he is exercising more.  04/20/16 update:  The patient is following up today.  I have not seen him in over a year.  He called me not long after our last visit to state that his tremor is  increasing and we increase his Inderal LA total of 120 mg daily.  He states that tremor was well controlled until he ran out of it 4 days ago.  He is therefore tremulous today.     Outside reports reviewed: historical medical records.  No Known Allergies  Current Outpatient Prescriptions on File Prior to Visit  Medication Sig Dispense Refill  . ALPRAZolam (XANAX) 0.5 MG tablet TAKE ONE TABLET TWICE DAILY AS NEEDED FOR ANXIETY 60 tablet 2  . amLODipine (NORVASC) 5 MG tablet TAKE ONE TABLET EACH DAY 30 tablet 5  . ascorbic acid (VITAMIN C) 500 MG tablet Take 500 mg by mouth daily.    Marland Kitchen atorvastatin (LIPITOR) 40 MG tablet Take 1 tablet (40 mg total) by mouth daily. 30 tablet 5  . ibuprofen (ADVIL,MOTRIN) 800 MG tablet Take 800 mg by mouth every 8 (eight) hours as needed.      Marland Kitchen losartan (COZAAR) 100 MG tablet TAKE ONE TABLET EACH DAY 30 tablet 5  . ranitidine (ZANTAC) 150 MG capsule Take 150 mg by mouth at bedtime.      . propranolol ER (INDERAL LA) 120 MG 24 hr capsule TAKE ONE CAPSULE EACH DAY (Patient not taking: Reported on 04/20/2016) 30 capsule 3   No current facility-administered medications on file prior to visit.     Past Medical History:  Diagnosis Date  . Depression   . Gout   . HSV-1 (herpes simplex virus 1) infection   . Hyperlipidemia   . Low back pain   . Sarcoidosis [facility])     Past Surgical History:  Procedure Laterality Date  . APPENDECTOMY    . KNEE ARTHROPLASTY  1995   Lt  . UMBILICAL HERNIA REPAIR      Social History   Social History  . Marital status: Married    Spouse name: N/A  . Number of children: N/A  . Years of education: N/A   Occupational History  . Not on file.   Social History Main Topics  . Smoking status: Never Smoker  . Smokeless tobacco: Former Neurosurgeon    Types: Chew    Quit date: 08/28/1996  . Alcohol use Yes     Comment: only occ  . Drug use: No  . Sexual activity: Not on file   Other Topics Concern  . Not on file   Social  History Narrative  . No narrative on file    Family Status  Relation Status  . Father Alive  . Sister Alive  . Mother Alive    Review of Systems A complete 10 system ROS was obtained and was negative apart from what is mentioned.   Objective:  VITALS:   Vitals:   04/20/16 1327  BP: (!) 150/80  BP Location: Right Arm  Patient Position: Sitting  Cuff Size: Normal  Pulse: 92  Weight: 223 lb (101.2 kg)  Height: 6\' 1"  (1.854 m)   Wt Readings from Last 3 Encounters:  04/20/16 223 lb (101.2 kg)  03/21/16 218 lb (98.9 kg)  11/29/15 205 lb (93 kg)    Gen:  Appears stated age and in NAD. HEENT:  Normocephalic, atraumatic. The mucous membranes are moist. The superficial temporal arteries are without ropiness or tenderness. Cardiovascular: Regular rate and rhythm. Lungs: Clear to auscultation bilaterally. Neck: There are no carotid bruits noted bilaterally.  NEUROLOGICAL:  Orientation:  The patient is alert and oriented x 3.   Cranial nerves: There is good facial symmetry.  Speech is fluent and clear. Soft palate rises symmetrically and there is no tongue deviation. Hearing is intact to conversational tone. Tone: There is normal tone today. Coordination:  The patient has good rapid alternating movements. Motor: Strength is 5/5 in the bilateral upper and lower extremities.  Shoulder shrug is equal bilaterally.  There is no pronator drift.  There are no fasciculations noted. Gait and Station: The patient is able to ambulate without difficulty.   MOVEMENT EXAM: Tremor:  There is increased tremor today with posture and with intention, overall moderate in nature.    LABS  Lab Results  Component Value Date   HGBA1C 5.4 08/05/2015   Lab Results  Component Value Date   VITAMINB12 381 09/23/2013   Lab Results  Component Value Date   TSH 1.051 09/09/2012        Assessment/Plan:   1.  Essential Tremor.  -His DAT scan was negative.  -continue Inderal LA to 120 mg.   Tremor was significant today but was out of medication for last several days. 2.  Follow-up in one year.

## 2016-04-20 ENCOUNTER — Other Ambulatory Visit: Payer: Self-pay | Admitting: Neurology

## 2016-04-20 ENCOUNTER — Encounter: Payer: Self-pay | Admitting: Neurology

## 2016-04-20 ENCOUNTER — Ambulatory Visit (INDEPENDENT_AMBULATORY_CARE_PROVIDER_SITE_OTHER): Payer: 59 | Admitting: Neurology

## 2016-04-20 VITALS — BP 150/80 | HR 92 | Ht 73.0 in | Wt 223.0 lb

## 2016-04-20 DIAGNOSIS — G250 Essential tremor: Secondary | ICD-10-CM | POA: Diagnosis not present

## 2016-04-20 MED ORDER — PROPRANOLOL HCL ER 120 MG PO CP24: 120.0000 mg | ORAL_CAPSULE | Freq: Every day | ORAL | 3 refills | Status: DC

## 2016-09-12 ENCOUNTER — Other Ambulatory Visit: Payer: 59 | Admitting: Internal Medicine

## 2016-09-12 DIAGNOSIS — I10 Essential (primary) hypertension: Secondary | ICD-10-CM

## 2016-09-12 DIAGNOSIS — Z0000 Encounter for general adult medical examination without abnormal findings: Secondary | ICD-10-CM

## 2016-09-12 DIAGNOSIS — E7800 Pure hypercholesterolemia, unspecified: Secondary | ICD-10-CM

## 2016-09-12 LAB — CBC WITH DIFFERENTIAL/PLATELET
BASOS PCT: 1 %
Basophils Absolute: 56 cells/uL (ref 0–200)
EOS ABS: 224 {cells}/uL (ref 15–500)
Eosinophils Relative: 4 %
HEMATOCRIT: 43.4 % (ref 38.5–50.0)
HEMOGLOBIN: 14.5 g/dL (ref 13.2–17.1)
LYMPHS ABS: 1400 {cells}/uL (ref 850–3900)
LYMPHS PCT: 25 %
MCH: 32.7 pg (ref 27.0–33.0)
MCHC: 33.4 g/dL (ref 32.0–36.0)
MCV: 97.7 fL (ref 80.0–100.0)
MONO ABS: 784 {cells}/uL (ref 200–950)
MPV: 9.6 fL (ref 7.5–12.5)
Monocytes Relative: 14 %
NEUTROS ABS: 3136 {cells}/uL (ref 1500–7800)
Neutrophils Relative %: 56 %
Platelets: 209 10*3/uL (ref 140–400)
RBC: 4.44 MIL/uL (ref 4.20–5.80)
RDW: 13 % (ref 11.0–15.0)
WBC: 5.6 10*3/uL (ref 3.8–10.8)

## 2016-09-12 LAB — LIPID PANEL
CHOL/HDL RATIO: 5.2 ratio — AB (ref <5.0)
CHOLESTEROL: 204 mg/dL — AB (ref <200)
HDL: 39 mg/dL — AB (ref >40)
Triglycerides: 488 mg/dL — ABNORMAL HIGH (ref <150)

## 2016-09-12 LAB — COMPREHENSIVE METABOLIC PANEL
ALBUMIN: 4.4 g/dL (ref 3.6–5.1)
ALK PHOS: 61 U/L (ref 40–115)
ALT: 46 U/L (ref 9–46)
AST: 45 U/L — AB (ref 10–35)
BILIRUBIN TOTAL: 0.7 mg/dL (ref 0.2–1.2)
BUN: 17 mg/dL (ref 7–25)
CALCIUM: 9.3 mg/dL (ref 8.6–10.3)
CO2: 23 mmol/L (ref 20–31)
Chloride: 101 mmol/L (ref 98–110)
Creat: 1.06 mg/dL (ref 0.70–1.33)
GLUCOSE: 90 mg/dL (ref 65–99)
POTASSIUM: 4.3 mmol/L (ref 3.5–5.3)
Sodium: 135 mmol/L (ref 135–146)
TOTAL PROTEIN: 7.3 g/dL (ref 6.1–8.1)

## 2016-09-12 LAB — PSA: PSA: 1 ng/mL (ref <=4.0)

## 2016-09-12 NOTE — Progress Notes (Signed)
Labs only

## 2016-09-14 ENCOUNTER — Encounter: Payer: 59 | Admitting: Internal Medicine

## 2016-10-05 ENCOUNTER — Other Ambulatory Visit: Payer: Self-pay | Admitting: Internal Medicine

## 2016-10-05 NOTE — Telephone Encounter (Signed)
Refill once 

## 2016-10-30 NOTE — Progress Notes (Signed)
Subjective:    Charles GellJohn P Villarreal was seen in consultation in the movement disorder clinic at the request of [name].  His PCP is Margaree MackintoshMary J [name], MD.  The evaluation is for tremor.  [facility] seen [facility].  I reviewed those records via Care Everywhere.   The pt states that the first sx started about 2 years ago and it was an acute episode of loss of balance and dizziness.  [place] [employer] [place]  He was dizzy and off balance for about 2 hours.  Admittedly, it was very hot outside.  Not long thereafter, he would note intermittent trouble writing because of tremor.  He would note tremor with other activites as well, such as with drinking out of a cup or using the arm.  This has gotten better with diet and lifestyle modifications (less caffeine, less alcohol, more exercise, adding propranolol).  The tremor can be R or L and is independent of activity.  He was started on propranolol by [name] for the tremor and BP.  [facility] and had wore a BP monitor.  His father and grandfather had hx of tremor.  He does not know if his grandfather ever got a dx and his father is still living, but is undiagnosed as far as he knows with any particular tremor type.  He was on prednisone around Christmas for flu (has hx of pulmonary sarcoid) and that didn't seem to make tremor worse.   Affected by caffeine:  yes, but decreased caffeine to one diet coke per day and one cup per day Affected by alcohol:  yes Affected by stress:  yes Affected by fatigue:  no (does seem worse around 1:30-2 pm) Spills soup if on spoon:  yes (intermittent) Spills glass of liquid if full:  no Affects ADL's (tying shoes, brushing teeth, etc):  no  03/05/14 update:  The patient was seen back in followup today.  He has had several tests in regards to his tremor, but also in regards to the fact that he had some concern in features on his physical exam or worrisome for a possible parkinsonian states,  including loss of balance, dizziness and elevated tone on the right.  Fortunately, his DaT scan done since last visit was unremarkable.  Unfortunately, his insurance does not pay for the scan.  I have written an appeal letter as this was completely within indications.  He did have other tests done since last visit.  His 24-hour urine for metanephrine test negative.  His 2 hour glucose tolerance test was unremarkable.  B12 was mildly low at 381.  He had plasma catecholamines drawn.  Norepinephrine was normal, as is dopamine.  Epinephrine was slightly elevated, but I think that this was just incidental.  Pt reports that clinically he is feeling much better.  Dizziness is virtually gone.  He only has this 1-2 times per month and it was daily.  He is also exercising every single day in the morning now, which has helped.  He thinks that the vitamin B12 and vitamin C have given him some energy, along with the exercises.  While his blood pressure was somewhat elevated today (states that he was almost late to get here), his blood pressure has [name] control.  He has gotten a fit bit and has become somewhat competitive with his family regarding this.  This has turned out to be a positive thing.  02/24/15 update:  The  patient returns today for follow-up.  Overall, the patient states that he has done well over the year, until very recently.  He ran out of the medication and now has noticed an increase in tremor.  Once he made a follow-up here, he was given a refill on the medication and restarted it on Monday so he has only been back on it for 2 days.  His tremor is improving but he still is noticing more than he originally was.  Otherwise, the patient states that he is doing well and has no new medical problems.  He does state that he is exercising more.  04/20/16 update:  The patient is following up today.  I have not seen him in over a year.  He called me not long after our last visit to state that his tremor is  increasing and we increase his Inderal LA total of 120 mg daily.  He states that tremor was well controlled until he ran out of it 4 days ago.  He is therefore tremulous today.    10/31/16 update:  Pt f/u today, earlier than expected.  Wife accompanies him and supplements the history.    On Inderal LA 120 mg daily.    States that he no longer really has periods of normalcy and has more times of tremor.  Hurt his back and can't exercise as much and thinks that this contributes but isn't only factor.  Hands and legs.  More with use of hands.  Trouble staying asleep as well; thinks that tremor awakens him.  No new medications.  Was able to back off on chol med because diet is better.  Feels that balance has not been as good.  No falls.   Outside reports reviewed: historical medical records.  No Known Allergies  Current Outpatient Prescriptions on File Prior to Visit  Medication Sig Dispense Refill  . ALPRAZolam (XANAX) 0.5 MG tablet TAKE ONE TABLET TWICE DAILY AS NEEDED FOR ANXIETY 60 tablet 0  . amLODipine (NORVASC) 5 MG tablet TAKE ONE TABLET EACH DAY 30 tablet 5  . ascorbic acid (VITAMIN C) 500 MG tablet Take 500 mg by mouth daily.    Marland Kitchen atorvastatin (LIPITOR) 40 MG tablet Take 1 tablet (40 mg total) by mouth daily. 30 tablet 5  . ibuprofen (ADVIL,MOTRIN) 800 MG tablet Take 800 mg by mouth every 8 (eight) hours as needed.      Marland Kitchen losartan (COZAAR) 100 MG tablet TAKE ONE TABLET EACH DAY 30 tablet 5  . propranolol ER (INDERAL LA) 120 MG 24 hr capsule Take 1 capsule (120 mg total) by mouth daily. 90 capsule 3  . ranitidine (ZANTAC) 150 MG capsule Take 150 mg by mouth at bedtime.       No current facility-administered medications on file prior to visit.     Past Medical History:  Diagnosis Date  . Depression   . Gout   . HSV-1 (herpes simplex virus 1) infection   . Hyperlipidemia   . Low back pain   . Sarcoidosis [facility])     Past Surgical History:  Procedure Laterality Date  . APPENDECTOMY     . KNEE ARTHROPLASTY  1995   Lt  . UMBILICAL HERNIA REPAIR      Social History   Social History  . Marital status: Married    Spouse name: N/A  . Number of children: N/A  . Years of education: N/A   Occupational History  . Not on file.   Social History Main  Topics  . Smoking status: Never Smoker  . Smokeless tobacco: Former Neurosurgeon    Types: Chew    Quit date: 08/28/1996  . Alcohol use Yes     Comment: only occ  . Drug use: No  . Sexual activity: Not on file   Other Topics Concern  . Not on file   Social History Narrative  . No narrative on file    Family Status  Relation Status  . Father Alive  . Sister Alive  . Mother Alive    Review of Systems A complete 10 system ROS was obtained and was negative apart from what is mentioned.   Objective:   VITALS:   Vitals:   10/31/16 0808  BP: 128/68  Pulse: 62  SpO2: 99%  Weight: 221 lb (100.2 kg)  Height: 6\' 1"  (1.854 m)   Wt Readings from Last 3 Encounters:  10/31/16 221 lb (100.2 kg)  04/20/16 223 lb (101.2 kg)  03/21/16 218 lb (98.9 kg)    Gen:  Appears stated age and in NAD.  He is somewhat tearful today. HEENT:  Normocephalic, atraumatic. The mucous membranes are moist. The superficial temporal arteries are without ropiness or tenderness. Cardiovascular: Regular rate and rhythm. Lungs: Clear to auscultation bilaterally. Neck: There are no carotid bruits noted bilaterally.  NEUROLOGICAL:  Orientation:  The patient is alert and oriented x 3.   Cranial nerves: There is good facial symmetry.  Speech is fluent and clear. Soft palate rises symmetrically and there is no tongue deviation. Hearing is intact to conversational tone. Tone: Has significant difficulty relaxing.  There may be a hint of rigidity in the left upper extremity. Coordination:  The patient has mild trouble with foot taps on the left.  He initially has trouble with alternation of supination and pronation bilaterally, but when asked to slow  down, he did better. Motor: Strength is 5/5 in the bilateral upper and lower extremities.  Shoulder shrug is equal bilaterally.  There is no pronator drift.  There are no fasciculations noted. Gait and Station: The patient is able to ambulate without difficulty.  He does have a re-emergent tremor on the left with ambulation.  MOVEMENT EXAM: Tremor:  There is increased tremor today with posture and with intention, overall moderate in nature.  It is much worse when in the proximal/wing beating position and only slightly worse when given a weight.  He spills water when asked to pour from one glass to another.  He has slight resting tremor bilaterally.  Resting tremor can be felt in the arms and legs more than seen.  LABS  Lab Results  Component Value Date   HGBA1C 5.4 08/05/2015   Lab Results  Component Value Date   VITAMINB12 381 09/23/2013   Lab Results  Component Value Date   TSH 1.051 09/09/2012        Assessment/Plan:   1.  Essential Tremor.  -His DAT scan was negative years ago, and I suspect that the resting tremor we are now seeing is just a progression of his essential tremor, plus an increase in his anxiety.  -continue Inderal LA 120 mg.   -Add primidone, 50 mg daily.  Discussed extensively, risks, benefits, and side effects, including first dose effect.  -Talked to the patient and his wife about potential DBS surgery.  This was the first time that his wife was present, so I spent much greater than 50% of the 40 minute visit in counseling with the patient and his wife.  2.  Follow-up in 8-10 weeks, sooner should new neurologic issues arise.  Suspect that we will need to increase his primidone at that visit.

## 2016-10-31 ENCOUNTER — Ambulatory Visit (INDEPENDENT_AMBULATORY_CARE_PROVIDER_SITE_OTHER): Payer: 59 | Admitting: Neurology

## 2016-10-31 ENCOUNTER — Encounter: Payer: Self-pay | Admitting: Neurology

## 2016-10-31 ENCOUNTER — Telehealth: Payer: Self-pay | Admitting: Internal Medicine

## 2016-10-31 ENCOUNTER — Encounter: Payer: 59 | Admitting: Internal Medicine

## 2016-10-31 VITALS — BP 128/68 | HR 62 | Ht 73.0 in | Wt 221.0 lb

## 2016-10-31 DIAGNOSIS — G250 Essential tremor: Secondary | ICD-10-CM

## 2016-10-31 DIAGNOSIS — F411 Generalized anxiety disorder: Secondary | ICD-10-CM

## 2016-10-31 MED ORDER — PRIMIDONE 50 MG PO TABS: 50.0000 mg | ORAL_TABLET | Freq: Every day | ORAL | 0 refills | Status: DC

## 2016-10-31 NOTE — Telephone Encounter (Signed)
I am not able to take any new patients at this time

## 2016-10-31 NOTE — Telephone Encounter (Signed)
Pt currently sees Dr Lurena Joinerebecca Tat and they are looking for a general PCP. Dr Tat recommended Dr. Lawerance BachBurns. Would she see him? If not, we can refer to someone else. He does not need to be seen for anything. They are just wanting to establish care.  Please advise. Thanks [employer]

## 2016-10-31 NOTE — Telephone Encounter (Signed)
Please advise 

## 2016-11-01 NOTE — Telephone Encounter (Signed)
Call patient and LVM to inform and could get est with a charlotte or greg.

## 2016-11-07 ENCOUNTER — Telehealth: Payer: Self-pay | Admitting: Neurology

## 2016-11-07 NOTE — Telephone Encounter (Signed)
That is probably why he is having rebound insomnia.  I bet it will get better if he stays off of it but will be a little bit.

## 2016-11-07 NOTE — Telephone Encounter (Signed)
Spoke with patient and he states he hasn't taken Xanax since a week before his appt with you. I advised this may be interfering with sleep as well but he will try moving Primidone to the morning.

## 2016-11-07 NOTE — Telephone Encounter (Signed)
Spoke with patient and he states that he was told Primidone may make him sleepy- he is actually having an issue with not sleeping. Since starting Primidone at bedtime he hasn't been able to fall asleep til about 2-3 in the morning. He wanted to know if he could move dose up to an earlier time.  Patient advised to switch Primidone to the morning and to call with any questions.

## 2016-11-07 NOTE — Telephone Encounter (Signed)
Is that because he d/c his sleep med (he told me he may try that)?  And I have no issue with him moving it up earlier in the day.

## 2016-11-07 NOTE — Telephone Encounter (Signed)
PT called in regards to his medication Primidone/Dawn CB# [phone]

## 2016-11-08 ENCOUNTER — Telehealth: Payer: Self-pay | Admitting: Neurology

## 2016-11-08 NOTE — Telephone Encounter (Signed)
He took Primidone around 1 pm yesterday and still didn't sleep. Advised this was probably because of the stopping of Xanax and his body needing to get used to this. He will keep us updated.

## 2016-11-08 NOTE — Telephone Encounter (Signed)
Charles Villarreal 10/17/1965. His # is [phone]. He wanted to let know that the medication he started last night (he said you would know) kept him up all night. Thank you

## 2016-12-22 NOTE — Progress Notes (Signed)
Subjective:    Charles Villarreal was seen in consultation in the movement disorder clinic at the request of Dr. Eden Emms.  His PCP is Margaree Mackintosh, MD.  The evaluation is for tremor.  He was previously seen at [facility] for the same.  I reviewed those records via Care Everywhere.   The pt states that the first sx started about 2 years ago and it was an acute episode of loss of balance and dizziness.  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  He was dizzy and off balance for about 2 hours.  Admittedly, it was very hot outside.  Not long thereafter, he would note intermittent trouble writing because of tremor.  He would note tremor with other activites as well, such as with drinking out of a cup or using the arm.  This has gotten better with diet and lifestyle modifications (less caffeine, less alcohol, more exercise, adding propranolol).  The tremor can be R or L and is independent of activity.  He was started on propranolol by Dr. Lenord Fellers for the tremor and BP.  He went to [facility] and had wore a BP monitor.  His father and grandfather had hx of tremor.  He does not know if his grandfather ever got a dx and his father is still living, but is undiagnosed as far as he knows with any particular tremor type.  He was on prednisone around Christmas for flu (has hx of pulmonary sarcoid) and that didn't seem to make tremor worse.   Affected by caffeine:  yes, but decreased caffeine to one diet coke per day and one cup per day Affected by alcohol:  yes Affected by stress:  yes Affected by fatigue:  no (does seem worse around 1:30-2 pm) Spills soup if on spoon:  yes (intermittent) Spills glass of liquid if full:  no Affects ADL's (tying shoes, brushing teeth, etc):  no  03/05/14 update:  The patient was seen back in followup today.  He has had several tests in regards to his tremor, but also in regards to the fact that he had some concern in features on his physical exam or worrisome for a possible parkinsonian states,  including loss of balance, dizziness and elevated tone on the right.  Fortunately, his DaT scan done since last visit was unremarkable.  Unfortunately, his insurance does not pay for the scan.  I have written an appeal letter as this was completely within indications.  He did have other tests done since last visit.  His 24-hour urine for metanephrine test negative.  His 2 hour glucose tolerance test was unremarkable.  B12 was mildly low at 381.  He had plasma catecholamines drawn.  Norepinephrine was normal, as is dopamine.  Epinephrine was slightly elevated, but I think that this was just incidental.  Pt reports that clinically he is feeling much better.  Dizziness is virtually gone.  He only has this 1-2 times per month and it was daily.  He is also exercising every single day in the morning now, which has helped.  He thinks that the vitamin B12 and vitamin C have given him some energy, along with the exercises.  While his blood pressure was somewhat elevated today (states that he was almost late to get here), his blood pressure has [name] control.  He has gotten a fit bit and has become somewhat competitive with his family regarding this.  This has turned out to be a positive thing.  02/24/15 update:  The  patient returns today for follow-up.  Overall, the patient states that he has done well over the year, until very recently.  He ran out of the medication and now has noticed an increase in tremor.  Once he made a follow-up here, he was given a refill on the medication and restarted it on Monday so he has only been back on it for 2 days.  His tremor is improving but he still is noticing more than he originally was.  Otherwise, the patient states that he is doing well and has no new medical problems.  He does state that he is exercising more.  04/20/16 update:  The patient is following up today.  I have not seen him in over a year.  He called me not long after our last visit to state that his tremor is  increasing and we increase his Inderal LA total of 120 mg daily.  He states that tremor was well controlled until he ran out of it 4 days ago.  He is therefore tremulous today.    10/31/16 update:  Pt f/u today, earlier than expected.  Wife accompanies him and supplements the history.    On Inderal LA 120 mg daily.    States that he no longer really has periods of normalcy and has more times of tremor.  Hurt his back and can't exercise as much and thinks that this contributes but isn't only factor.  Hands and legs.  More with use of hands.  Trouble staying asleep as well; thinks that tremor awakens him.  No new medications.  Was able to back off on chol med because diet is better.  Feels that balance has not been as good.  No falls.  12/26/16 update:  Pt seen today in f/u for tremor.  Patient is currently on primidone, 50 mg daily which was added last visit to his Inderal LA, 120 mg daily.  States that primidone did help but it caused sleep trouble.  Pt states that he went fishing this weekend and he hurt his back and went to a [place] this weekend and got some prednisone yesterday.  This hasn't worsened his tremor yet.  On flexeril right now for his back.     Outside reports reviewed: historical medical records.  No Known Allergies  Current Outpatient Prescriptions on File Prior to Visit  Medication Sig Dispense Refill  . ALPRAZolam (XANAX) 0.5 MG tablet TAKE ONE TABLET TWICE DAILY AS NEEDED FOR ANXIETY 60 tablet 0  . amLODipine (NORVASC) 5 MG tablet TAKE ONE TABLET EACH DAY 30 tablet 5  . ascorbic acid (VITAMIN C) 500 MG tablet Take 500 mg by mouth daily.    Marland Kitchen atorvastatin (LIPITOR) 40 MG tablet Take 1 tablet (40 mg total) by mouth daily. 30 tablet 5  . ibuprofen (ADVIL,MOTRIN) 800 MG tablet Take 800 mg by mouth every 8 (eight) hours as needed.      Marland Kitchen losartan (COZAAR) 100 MG tablet TAKE ONE TABLET EACH DAY 30 tablet 5  . primidone (MYSOLINE) 50 MG tablet Take 1 tablet (50 mg total) by mouth at bedtime.  90 tablet 0  . propranolol ER (INDERAL LA) 120 MG 24 hr capsule Take 1 capsule (120 mg total) by mouth daily. 90 capsule 3  . ranitidine (ZANTAC) 150 MG capsule Take 150 mg by mouth at bedtime.       No current facility-administered medications on file prior to visit.     Past Medical History:  Diagnosis Date  . Depression   .  Gout   . HSV-1 (herpes simplex virus 1) infection   . Hyperlipidemia   . Low back pain   . Sarcoidosis [facility])     Past Surgical History:  Procedure Laterality Date  . APPENDECTOMY    . KNEE ARTHROPLASTY  1995   Lt  . UMBILICAL HERNIA REPAIR      Social History   Social History  . Marital status: Married    Spouse name: N/A  . Number of children: N/A  . Years of education: N/A   Occupational History  . Not on file.   Social History Main Topics  . Smoking status: Never Smoker  . Smokeless tobacco: Former Neurosurgeon    Types: Chew    Quit date: 08/28/1996  . Alcohol use Yes     Comment: only occ  . Drug use: No  . Sexual activity: Not on file   Other Topics Concern  . Not on file   Social History Narrative  . No narrative on file    Family Status  Relation Status  . Father Alive  . Sister Alive  . Mother Alive    Review of Systems A complete 10 system ROS was obtained and was negative apart from what is mentioned.   Objective:   VITALS:   There were no vitals filed for this visit. Wt Readings from Last 3 Encounters:  10/31/16 221 lb (100.2 kg)  04/20/16 223 lb (101.2 kg)  03/21/16 218 lb (98.9 kg)    Gen:  Appears stated age and in NAD.  He is somewhat tearful today. HEENT:  Normocephalic, atraumatic. The mucous membranes are moist. The superficial temporal arteries are without ropiness or tenderness. Cardiovascular: Regular rate and rhythm. Lungs: Clear to auscultation bilaterally. Neck: There are no carotid bruits noted bilaterally.  NEUROLOGICAL:  Orientation:  The patient is alert and oriented x 3.   Cranial nerves:  There is good facial symmetry.  Speech is fluent and clear. Soft palate rises symmetrically and there is no tongue deviation. Hearing is intact to conversational tone. Tone: Has significant difficulty relaxing.  There may be a hint of rigidity in the left upper extremity. Coordination:  The patient has mild trouble with foot taps on the left.  He initially has trouble with alternation of supination and pronation bilaterally, but when asked to slow down, he did better. Motor: Strength is 5/5 in the bilateral upper and lower extremities.  Shoulder shrug is equal bilaterally.  There is no pronator drift.  There are no fasciculations noted. Gait and Station: The patients gait is antalgic because of back pain.  MOVEMENT EXAM: Tremor:  There is mild to mod tremor of the outstretched hands that increases with intention.  Slightly increases with a weight in the hand.  LABS  Lab Results  Component Value Date   HGBA1C 5.4 08/05/2015   Lab Results  Component Value Date   VITAMINB12 381 09/23/2013   Lab Results  Component Value Date   TSH 1.051 09/09/2012        Assessment/Plan:   1.  Essential Tremor.  -His DAT scan was negative years ago, and I suspect that the resting tremor we are now seeing is just a progression of his essential tremor, plus an increase in his anxiety.  -continue Inderal LA 120 mg.   - primidone, 50 mg daily.  Discussed extensively, risks, benefits, and side effects, including first dose effect.  Pt having issues with it causing difficulty sleeping which is unusual but that is a  little better.  He still has some tremor but doesn't want to go up on med.  -Pt wants to hold on DBS surgery 2.  Follow-up in 4-5 months.

## 2016-12-25 DIAGNOSIS — R030 Elevated blood-pressure reading, without diagnosis of hypertension: Secondary | ICD-10-CM | POA: Diagnosis not present

## 2016-12-25 DIAGNOSIS — M62830 Muscle spasm of back: Secondary | ICD-10-CM | POA: Diagnosis not present

## 2016-12-26 ENCOUNTER — Ambulatory Visit (INDEPENDENT_AMBULATORY_CARE_PROVIDER_SITE_OTHER): Payer: BLUE CROSS/BLUE SHIELD | Admitting: Neurology

## 2016-12-26 ENCOUNTER — Telehealth: Payer: Self-pay | Admitting: General Practice

## 2016-12-26 ENCOUNTER — Encounter: Payer: Self-pay | Admitting: Neurology

## 2016-12-26 ENCOUNTER — Telehealth: Payer: Self-pay | Admitting: Neurology

## 2016-12-26 VITALS — BP 122/80 | HR 74 | Ht 73.0 in | Wt 218.0 lb

## 2016-12-26 DIAGNOSIS — G250 Essential tremor: Secondary | ICD-10-CM

## 2016-12-26 MED ORDER — PRIMIDONE 50 MG PO TABS: 50.0000 mg | ORAL_TABLET | Freq: Every day | ORAL | 1 refills | Status: DC

## 2016-12-26 NOTE — Telephone Encounter (Signed)
Primidone sent to pharmacy.

## 2016-12-26 NOTE — Telephone Encounter (Signed)
Patient state that he has zero refills left on medication and Lesly Rubenstein would know what he was talking about

## 2016-12-29 DIAGNOSIS — M545 Low back pain: Secondary | ICD-10-CM | POA: Diagnosis not present

## 2017-01-01 DIAGNOSIS — M4854XA Collapsed vertebra, not elsewhere classified, thoracic region, initial encounter for fracture: Secondary | ICD-10-CM | POA: Diagnosis not present

## 2017-01-01 DIAGNOSIS — S22081A Stable burst fracture of T11-T12 vertebra, initial encounter for closed fracture: Secondary | ICD-10-CM | POA: Diagnosis not present

## 2017-01-01 DIAGNOSIS — M546 Pain in thoracic spine: Secondary | ICD-10-CM | POA: Diagnosis not present

## 2017-01-11 DIAGNOSIS — S22080A Wedge compression fracture of T11-T12 vertebra, initial encounter for closed fracture: Secondary | ICD-10-CM | POA: Diagnosis not present

## 2017-01-30 NOTE — Telephone Encounter (Signed)
-----   Message from Sheliah HatchKatherine E Tabori, MD sent at 12/26/2016  8:30 AM EDT ----- Yes, ma'am!  i'd be happy to see him  Thanks for thinking of me! KT ----- Message ----- From: Vladimir Fasterebecca S Tat, DO Sent: 12/26/2016   8:27 AM To: Sheliah HatchKatherine E Tabori, MD  Are you taking new patients?  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  Let me know...  If not, do you know anyone down this way taking new patients?  He is privately insured.  He lives this way but doesn't mind travelling

## 2017-02-01 DIAGNOSIS — S22080A Wedge compression fracture of T11-T12 vertebra, initial encounter for closed fracture: Secondary | ICD-10-CM | POA: Diagnosis not present

## 2017-02-23 DIAGNOSIS — F4321 Adjustment disorder with depressed mood: Secondary | ICD-10-CM | POA: Diagnosis not present

## 2017-03-01 DIAGNOSIS — I10 Essential (primary) hypertension: Secondary | ICD-10-CM | POA: Diagnosis not present

## 2017-03-01 DIAGNOSIS — S22080A Wedge compression fracture of T11-T12 vertebra, initial encounter for closed fracture: Secondary | ICD-10-CM | POA: Diagnosis not present

## 2017-03-01 DIAGNOSIS — Z6828 Body mass index (BMI) 28.0-28.9, adult: Secondary | ICD-10-CM | POA: Diagnosis not present

## 2017-03-06 ENCOUNTER — Other Ambulatory Visit: Payer: Self-pay | Admitting: Internal Medicine

## 2017-03-30 DIAGNOSIS — S22080A Wedge compression fracture of T11-T12 vertebra, initial encounter for closed fracture: Secondary | ICD-10-CM | POA: Diagnosis not present

## 2017-05-01 NOTE — Progress Notes (Signed)
Subjective:    Charles Villarreal was seen in consultation in the movement disorder clinic at the request of Charles Villarreal.  His PCP is Charles Villarreal, Charles ColeMary Villarreal, Charles Villarreal.  The evaluation is for tremor.  [facility] was [facility] seen [facility].  I reviewed those records via Care Everywhere.   The pt states that the first sx started about 2 years ago and it was an acute episode of loss of balance and dizziness.  He was at [place] [employer] [place]  He was dizzy and off balance for about 2 hours.  Admittedly, it was very hot outside.  Not long thereafter, he would note intermittent trouble writing because of tremor.  He would note tremor with other activites as well, such as with drinking out of a cup or using the arm.  This has gotten better with diet and lifestyle modifications (less caffeine, less alcohol, more exercise, adding propranolol).  The tremor can be R or L and is independent of activity.  He was started on propranolol by Charles Villarreal for the tremor and BP.  [facility] and had [facility] monitor.  His father and grandfather had hx of tremor.  He does not know if his grandfather ever got a dx and his father is still living, but is undiagnosed as far as he knows with any particular tremor type.  He was on prednisone around Christmas for flu (has hx of pulmonary sarcoid) and that didn't seem to make tremor worse.   Affected by caffeine:  yes, but decreased caffeine to one diet coke per day and one cup per day Affected by alcohol:  yes Affected by stress:  yes Affected by fatigue:  no (does seem worse around 1:30-2 pm) Spills soup if on spoon:  yes (intermittent) Spills glass of liquid if full:  no Affects ADL's (tying shoes, brushing teeth, etc):  no  03/05/14 update:  The patient was seen back in followup today.  He has had several tests in regards to his tremor, but also in regards to the fact that he had some concern in features on his physical exam or worrisome for a possible parkinsonian states,  including loss of balance, dizziness and elevated tone on the right.  Fortunately, his DaT scan done since last visit was unremarkable.  Unfortunately, his insurance does not pay for the scan.  I have written an appeal letter as this was completely within indications.  He did have other tests done since last visit.  His 24-hour urine for metanephrine test negative.  His 2 hour glucose tolerance test was unremarkable.  B12 was mildly low at 381.  He had plasma catecholamines drawn.  Norepinephrine was normal, as is dopamine.  Epinephrine was slightly elevated, but I think that this was just incidental.  Pt reports that clinically he is feeling much better.  Dizziness is virtually gone.  He only has this 1-2 times per month and it was daily.  He is also exercising every single day in the morning now, which has helped.  He thinks that the vitamin B12 and vitamin C have given him some energy, along with the exercises.  While his blood pressure was somewhat elevated today (states that he was almost late to get here), his blood pressure has [name] control.  He has gotten a fit bit and has become somewhat competitive with his family regarding this.  This has turned out to be a positive thing.  02/24/15 update:  The  patient returns today for follow-up.  Overall, the patient states that he has done well over the year, until very recently.  He ran out of the medication and now has noticed an increase in tremor.  Once he made a follow-up here, he was given a refill on the medication and restarted it on Monday so he has only been back on it for 2 days.  His tremor is improving but he still is noticing more than he originally was.  Otherwise, the patient states that he is doing well and has no new medical problems.  He does state that he is exercising more.  04/20/16 update:  The patient is following up today.  I have not seen him in over a year.  He called me not long after our last visit to state that his tremor is  increasing and we increase his Inderal LA total of 120 mg daily.  He states that tremor was well controlled until he ran out of it 4 days ago.  He is therefore tremulous today.    10/31/16 update:  Pt f/u today, earlier than expected.  Wife accompanies him and supplements the history.    On Inderal LA 120 mg daily.    States that he no longer really has periods of normalcy and has more times of tremor.  Hurt his back and can't exercise as much and thinks that this contributes but isn't only factor.  Hands and legs.  More with use of hands.  Trouble staying asleep as well; thinks that tremor awakens him.  No new medications.  Was able to back off on chol med because diet is better.  Feels that balance has not been as good.  No falls.  12/26/16 update:  Pt seen today in f/u for tremor.  Patient is currently on primidone, 50 mg daily which was added last visit to his Inderal LA, 120 mg daily.  States that primidone did help but it caused sleep trouble.  Pt states that he went fishing this weekend and he hurt his back and went to a [place] this weekend and got some prednisone yesterday.  This hasn't worsened his tremor yet.  On flexeril right now for his back.    05/03/17 update:  Patient seen today in follow-up for essential tremor.  Patient is on Inderal LA, 120 mg daily.  He is also on primidone, 50 mg daily.  The patient reports that "its so much better than it was but there is a little tremor lately."  He is able to write for himself lately.  The addition of primidone really helped.     Last time, he told me he had injured his back while fishing on the boat.  Turns out that he sustained a fracture of the back.  He had seen Charles Villarreal.  It is slowly getting better.  He only uses ultram when he exercises.     Outside reports reviewed: historical medical records.  No Known Allergies  Current Outpatient Prescriptions on File Prior to Visit  Medication Sig Dispense Refill  . amLODipine (NORVASC) 5 MG tablet TAKE ONE  TABLET EACH DAY 30 tablet 5  . ascorbic acid (VITAMIN C) 500 MG tablet Take 500 mg by mouth daily.    Marland Kitchen. atorvastatin (LIPITOR) 40 MG tablet Take 1 tablet (40 mg total) by mouth daily. 30 tablet 5  . cyclobenzaprine (FLEXERIL) 10 MG tablet Take 10 mg by mouth at bedtime.    Marland Kitchen. losartan (COZAAR) 100 MG tablet TAKE  ONE TABLET EACH DAY 90 tablet 0  . primidone (MYSOLINE) 50 MG tablet Take 1 tablet (50 mg total) by mouth at bedtime. (Patient taking differently: Take 50 mg by mouth daily. ) 90 tablet 1  . propranolol ER (INDERAL LA) 120 MG 24 hr capsule Take 1 capsule (120 mg total) by mouth daily. 90 capsule 3  . ranitidine (ZANTAC) 150 MG capsule Take 150 mg by mouth at bedtime.       No current facility-administered medications on file prior to visit.     Past Medical History:  Diagnosis Date  . Depression   . Gout   . HSV-1 (herpes simplex virus 1) infection   . Hyperlipidemia   . Low back pain   . Sarcoidosis     Past Surgical History:  Procedure Laterality Date  . APPENDECTOMY    . KNEE ARTHROPLASTY  1995   Lt  . UMBILICAL HERNIA REPAIR      Social History   Social History  . Marital status: Married    Spouse name: N/A  . Number of children: N/A  . Years of education: N/A   Occupational History  . Not on file.   Social History Main Topics  . Smoking status: Never Smoker  . Smokeless tobacco: Former Neurosurgeon    Types: Chew    Quit date: 08/28/1996  . Alcohol use Yes     Comment: only occ  . Drug use: No  . Sexual activity: Not on file   Other Topics Concern  . Not on file   Social History Narrative  . No narrative on file    Family Status  Relation Status  . Father Alive  . Sister Alive  . Mother Alive     Review of Systems A complete 10 system ROS was obtained and was negative apart from what is mentioned.   Objective:   VITALS:   Vitals:   05/03/17 0816  BP: 122/84  Pulse: (!) 52  SpO2: 98%  Weight: 210 lb (95.3 kg)  Height: 6' 0.5" (1.842 m)    Wt Readings from Last 3 Encounters:  05/03/17 210 lb (95.3 kg)  12/26/16 218 lb (98.9 kg)  10/31/16 221 lb (100.2 kg)    Gen:  Appears stated age and in NAD. Not diaphoretic today HEENT:  Normocephalic, atraumatic. The mucous membranes are moist. The superficial temporal arteries are without ropiness or tenderness. Cardiovascular: Bradycardic.  Regular.   Lungs: Clear to auscultation bilaterally. Neck: There are no carotid bruits noted bilaterally.  NEUROLOGICAL:  Orientation:  The patient is alert and oriented x 3.   Cranial nerves: There is good facial symmetry.  Speech is fluent and clear. Soft palate rises symmetrically and there is no tongue deviation. Hearing is intact to conversational tone. Tone: he has normal tone today. Coordination:  Good RAMs today Motor: Strength is 5/5 in the bilateral upper and lower extremities.  Shoulder shrug is equal bilaterally.  There is no pronator drift.  There are no fasciculations noted. Gait and Station: The patient walks well  MOVEMENT EXAM: Tremor:  There is mild tremor of the outstretched hands.  He has good archimedes spirals today.  Writes a sentence with no trouble  LABS  Lab Results  Component Value Date   HGBA1C 5.4 08/05/2015   Lab Results  Component Value Date   VITAMINB12 381 09/23/2013   Lab Results  Component Value Date   TSH 1.051 09/09/2012     Chemistry      Component  Value Date/Time   NA 135 09/12/2016 1129   K 4.3 09/12/2016 1129   CL 101 09/12/2016 1129   CO2 23 09/12/2016 1129   BUN 17 09/12/2016 1129   CREATININE 1.06 09/12/2016 1129      Component Value Date/Time   CALCIUM 9.3 09/12/2016 1129   ALKPHOS 61 09/12/2016 1129   AST 45 (H) 09/12/2016 1129   ALT 46 09/12/2016 1129   BILITOT 0.7 09/12/2016 1129          Assessment/Plan:   1.  Essential Tremor.  -His DAT scan was negative years ago.  His tremor is better controlled today.  -continue Inderal LA 120 mg.   - primidone, 50 mg  daily.  Doing well with this medication and has helped a lot.  Will continue. 2.  Follow-up in 6-8  months.  Reminded patient to get follow up with his PCP.

## 2017-05-03 ENCOUNTER — Ambulatory Visit (INDEPENDENT_AMBULATORY_CARE_PROVIDER_SITE_OTHER): Payer: BLUE CROSS/BLUE SHIELD | Admitting: Neurology

## 2017-05-03 ENCOUNTER — Encounter: Payer: Self-pay | Admitting: Neurology

## 2017-05-03 VITALS — BP 122/84 | HR 52 | Ht 72.5 in | Wt 210.0 lb

## 2017-05-03 DIAGNOSIS — G250 Essential tremor: Secondary | ICD-10-CM | POA: Diagnosis not present

## 2017-05-03 MED ORDER — PROPRANOLOL HCL ER 120 MG PO CP24: 120.0000 mg | ORAL_CAPSULE | Freq: Every day | ORAL | 3 refills | Status: DC

## 2017-05-03 MED ORDER — PRIMIDONE 50 MG PO TABS: 50.0000 mg | ORAL_TABLET | Freq: Every day | ORAL | 2 refills | Status: DC

## 2017-05-07 DIAGNOSIS — F4321 Adjustment disorder with depressed mood: Secondary | ICD-10-CM | POA: Diagnosis not present

## 2017-05-10 DIAGNOSIS — Z6828 Body mass index (BMI) 28.0-28.9, adult: Secondary | ICD-10-CM | POA: Diagnosis not present

## 2017-05-10 DIAGNOSIS — S22080A Wedge compression fracture of T11-T12 vertebra, initial encounter for closed fracture: Secondary | ICD-10-CM | POA: Diagnosis not present

## 2017-05-10 DIAGNOSIS — I10 Essential (primary) hypertension: Secondary | ICD-10-CM | POA: Diagnosis not present

## 2017-06-13 DIAGNOSIS — F4321 Adjustment disorder with depressed mood: Secondary | ICD-10-CM | POA: Diagnosis not present

## 2017-07-26 DIAGNOSIS — F4321 Adjustment disorder with depressed mood: Secondary | ICD-10-CM | POA: Diagnosis not present

## 2017-08-30 DIAGNOSIS — S22080A Wedge compression fracture of T11-T12 vertebra, initial encounter for closed fracture: Secondary | ICD-10-CM | POA: Diagnosis not present

## 2017-08-30 DIAGNOSIS — I10 Essential (primary) hypertension: Secondary | ICD-10-CM | POA: Diagnosis not present

## 2017-08-30 DIAGNOSIS — Z6829 Body mass index (BMI) 29.0-29.9, adult: Secondary | ICD-10-CM | POA: Diagnosis not present

## 2017-09-13 DIAGNOSIS — Z6828 Body mass index (BMI) 28.0-28.9, adult: Secondary | ICD-10-CM | POA: Diagnosis not present

## 2017-09-13 DIAGNOSIS — S22080A Wedge compression fracture of T11-T12 vertebra, initial encounter for closed fracture: Secondary | ICD-10-CM | POA: Diagnosis not present

## 2017-09-13 DIAGNOSIS — I10 Essential (primary) hypertension: Secondary | ICD-10-CM | POA: Diagnosis not present

## 2017-09-24 NOTE — Progress Notes (Signed)
Subjective:    Charles Villarreal was seen in consultation in the movement disorder clinic at the request of [name].  His PCP is [name], [name], MD.  The evaluation is for tremor.  He was previously seen at [facility] for the same.  I reviewed those records via Care Everywhere.   The pt states that the first sx started about 2 years ago and it was an acute episode of loss of balance and dizziness.  He was at [place] [employer] [place]  He was dizzy and off balance for about 2 hours.  Admittedly, it was very hot outside.  Not long thereafter, he would note intermittent trouble writing because of tremor.  He would note tremor with other activites as well, such as with drinking out of a cup or using the arm.  This has gotten better with diet and lifestyle modifications (less caffeine, less alcohol, more exercise, adding propranolol).  The tremor can be R or L and is independent of activity.  He was started on propranolol by [name] for the tremor and BP.  [facility] went to [facility] and had wore a BP monitor.  His father and grandfather had hx of tremor.  He does not know if his grandfather ever got a dx and his father is still living, but is undiagnosed as far as he knows with any particular tremor type.  He was on prednisone around Christmas for flu (has hx of pulmonary sarcoid) and that didn't seem to make tremor worse.   Affected by caffeine:  yes, but decreased caffeine to one diet coke per day and one cup per day Affected by alcohol:  yes Affected by stress:  yes Affected by fatigue:  no (does seem worse around 1:30-2 pm) Spills soup if on spoon:  yes (intermittent) Spills glass of liquid if full:  no Affects ADL's (tying shoes, brushing teeth, etc):  no  03/05/14 update:  The patient was seen back in followup today.  He has had several tests in regards to his tremor, but also in regards to the fact that he had some concern in features on his physical exam or worrisome for a possible parkinsonian states,  including loss of balance, dizziness and elevated tone on the right.  Fortunately, his DaT scan done since last visit was unremarkable.  Unfortunately, his insurance does not pay for the scan.  I have written an appeal letter as this was completely within indications.  He did have other tests done since last visit.  His 24-hour urine for metanephrine test negative.  His 2 hour glucose tolerance test was unremarkable.  B12 was mildly low at 381.  He had plasma catecholamines drawn.  Norepinephrine was normal, as is dopamine.  Epinephrine was slightly elevated, but I think that this was just incidental.  Pt reports that clinically he is feeling much better.  Dizziness is virtually gone.  He only has this 1-2 times per month and it was daily.  He is also exercising every single day in the morning now, which has helped.  He thinks that the vitamin B12 and vitamin C have given him some energy, along with the exercises.  While his blood pressure was somewhat elevated today (states that he was almost late to get here), his blood pressure has [name] control.  He has gotten a fit bit and has become somewhat competitive with his family regarding this.  This has turned out to be a positive thing.  02/24/15 update:  The  patient returns today for follow-up.  Overall, the patient states that he has done well over the year, until very recently.  He ran out of the medication and now has noticed an increase in tremor.  Once he made a follow-up here, he was given a refill on the medication and restarted it on Monday so he has only been back on it for 2 days.  His tremor is improving but he still is noticing more than he originally was.  Otherwise, the patient states that he is doing well and has no new medical problems.  He does state that he is exercising more.  04/20/16 update:  The patient is following up today.  I have not seen him in over a year.  He called me not long after our last visit to state that his tremor is  increasing and we increase his Inderal LA total of 120 mg daily.  He states that tremor was well controlled until he ran out of it 4 days ago.  He is therefore tremulous today.    10/31/16 update:  Pt f/u today, earlier than expected.  Wife accompanies him and supplements the history.    On Inderal LA 120 mg daily.    States that he no longer really has periods of normalcy and has more times of tremor.  Hurt his back and can't exercise as much and thinks that this contributes but isn't only factor.  Hands and legs.  More with use of hands.  Trouble staying asleep as well; thinks that tremor awakens him.  No new medications.  Was able to back off on chol med because diet is better.  Feels that balance has not been as good.  No falls.  12/26/16 update:  Pt seen today in f/u for tremor.  Patient is currently on primidone, 50 mg daily which was added last visit to his Inderal LA, 120 mg daily.  States that primidone did help but it caused sleep trouble.  Pt states that he went fishing this weekend and he hurt his back and went to a PA this weekend and got some prednisone yesterday.  This hasn't worsened his tremor yet.  On flexeril right now for his back.    05/03/17 update:  Patient seen today in follow-up for essential tremor.  Patient is on Inderal LA, 120 mg daily.  He is also on primidone, 50 mg daily.  The patient reports that "its so much better than it was but there is a little tremor lately."  He is able to write for himself lately.  The addition of primidone really helped.     Last time, he told me he had injured his back while fishing on the boat.  Turns out that he sustained a fracture of the back.  He had seen Dr. Dutch Quint.  It is slowly getting better.  He only uses ultram when he exercises.    09/25/17 update: Patient is seen today in follow-up for essential tremor.  He is seen earlier than scheduled. This patient is accompanied in the office by his wife who supplements the history.   He is on Inderal LA,  120 mg daily.  He is also on primidone, 50 mg daily.  The patient states that on Sunday he woke up and had severe back pain (not unusual) but when he went to stand up he was very off balance and somewhat dizzy.  Looking back, he didn't go to work the previous thurs/friday due to lack of balance.  When asked, he stated that the lack of balance was much worse than dizziness and he does not describe any vertigo.  He also noted some tremor in the legs.  He started on valium for his back in the beginning of Jan and he didn't note that it was affecting balance but wife noted that it was affecting him cognitively.  Pt does think that it was causing a hang over effect.  His balance is slowing getting better since Sunday.  He does feel a little weak in proximal arms and legs and they feel sore, as if he was working out.  He has been on lipitor for years.  He has no lateralizing weakness/paresthesias.  Has MRI on back later today at neurosx office.  Also had emesis x 2 days at dinner time.  On tramadol but doesn't think that emesis associated with that.     Outside reports reviewed: historical medical records.  No Known Allergies  Current Outpatient Medications on File Prior to Visit  Medication Sig Dispense Refill  . amLODipine (NORVASC) 5 MG tablet TAKE ONE TABLET EACH DAY 30 tablet 5  . ascorbic acid (VITAMIN C) 500 MG tablet Take 500 mg by mouth daily.    Marland Kitchen. atorvastatin (LIPITOR) 40 MG tablet Take 1 tablet (40 mg total) by mouth daily. 30 tablet 5  . cyclobenzaprine (FLEXERIL) 10 MG tablet Take 10 mg by mouth at bedtime.    . diazepam (VALIUM) 5 MG tablet at bedtime.  0  . losartan (COZAAR) 100 MG tablet TAKE ONE TABLET EACH DAY 90 tablet 0  . primidone (MYSOLINE) 50 MG tablet Take 1 tablet (50 mg total) by mouth at bedtime. 90 tablet 2  . propranolol ER (INDERAL LA) 120 MG 24 hr capsule Take 1 capsule (120 mg total) by mouth daily. 90 capsule 3  . ranitidine (ZANTAC) 150 MG capsule Take 150 mg by mouth  at bedtime.      . traMADol (ULTRAM) 50 MG tablet Take 50 mg by mouth daily.  0   No current facility-administered medications on file prior to visit.     Past Medical History:  Diagnosis Date  . Depression   . Gout   . HSV-1 (herpes simplex virus 1) infection   . Hyperlipidemia   . Low back pain   . Sarcoidosis     Past Surgical History:  Procedure Laterality Date  . APPENDECTOMY    . KNEE ARTHROPLASTY  1995   Lt  . UMBILICAL HERNIA REPAIR      Social History   Socioeconomic History  . Marital status: Married    Spouse name: Not on file  . Number of children: Not on file  . Years of education: Not on file  . Highest education level: Not on file  Social Needs  . Financial resource strain: Not on file  . Food insecurity - worry: Not on file  . Food insecurity - inability: Not on file  . Transportation needs - medical: Not on file  . Transportation needs - non-medical: Not on file  Occupational History  . Not on file  Tobacco Use  . Smoking status: Never Smoker  . Smokeless tobacco: Former NeurosurgeonUser    Types: Chew  Substance and Sexual Activity  . Alcohol use: Yes    Comment: only occ  . Drug use: No  . Sexual activity: Not on file  Other Topics Concern  . Not on file  Social History Narrative  . Not on file  Family Status  Relation Name Status  . Father  Alive  . Sister  Alive  . Mother  Alive     Review of Systems A complete 10 system ROS was obtained and was negative apart from what is mentioned.   Objective:   VITALS:   Vitals:   09/25/17 0902  BP: 128/90  Pulse: (!) 58  Weight: 210 lb (95.3 kg)  Height: 6' (1.829 m)   Wt Readings from Last 3 Encounters:  09/25/17 210 lb (95.3 kg)  05/03/17 210 lb (95.3 kg)  12/26/16 218 lb (98.9 kg)   Gen:  Appears stated age and in NAD. HEENT:  Normocephalic, atraumatic. The mucous membranes are moist. The superficial temporal arteries are without ropiness or tenderness. Cardiovascular: Regular rate  and rhythm. Lungs: Clear to auscultation bilaterally. Neck: There are no carotid bruits noted bilaterally.  NEUROLOGICAL:  Orientation:  The patient is alert and oriented x 3.   Cranial nerves: There is good facial symmetry.  Extraocular muscles are intact and visual fields are full to confrontational testing. Speech is fluent and clear. Soft palate rises symmetrically and there is no tongue deviation. Hearing is intact to conversational tone. Tone: Tone is good throughout. Sensation: Sensation is intact to light touch and pinprick throughout (facial, trunk, extremities).  There are no asymmetries.  Vibration is intact at the bilateral big toe. There is no extinction with double simultaneous stimulation. There is no sensory dermatomal level identified. Coordination:  The patient has no difficulty with RAM's or FNF bilaterally. Motor: Strength is 5/5 in the bilateral upper and lower extremities.  Shoulder shrug is equal bilaterally.  There is no pronator drift.  There are no fasciculations noted. DTR's: Deep tendon reflexes are 2/4 at the bilateral biceps, triceps, brachioradialis, 2+ at the right patella, 2/4 at the left patella and achilles.  Plantar responses are downgoing bilaterally. Gait and Station: The patient arises somewhat tenuously and by pushing off of the chair because of back pain.  He ambulates in a wide-based fashion.  He is just very mildly unsteady.  He does have trouble ambulating in a tandem fashion.  He is able to stay in the Romberg position without any trouble.    MOVEMENT EXAM: Tremor:  There is almost no tremor of the outstretched hands today.  LABS  Lab Results  Component Value Date   HGBA1C 5.4 08/05/2015   Lab Results  Component Value Date   VITAMINB12 381 09/23/2013   Lab Results  Component Value Date   TSH 1.051 09/09/2012     Chemistry      Component Value Date/Time   NA 135 09/12/2016 1129   K 4.3 09/12/2016 1129   CL 101 09/12/2016 1129   CO2  23 09/12/2016 1129   BUN 17 09/12/2016 1129   CREATININE 1.06 09/12/2016 1129      Component Value Date/Time   CALCIUM 9.3 09/12/2016 1129   ALKPHOS 61 09/12/2016 1129   AST 45 (H) 09/12/2016 1129   ALT 46 09/12/2016 1129   BILITOT 0.7 09/12/2016 1129          Assessment/Plan:   1.  Essential Tremor.  -His DAT scan was negative years ago.  His tremor actually looks well-controlled today.  -continue Inderal LA 120 mg.   - primidone, 50 mg daily.  Doing well with this medication and has helped a lot.  Will continue.  -obtain labs since been over a year and since having myalgia, nausea, vomiting.  CBC, chem, B12 (dx  B12 deficiency), TSH, CPK 2.  Balance change  -do CT brain.  If negative and continues to get better will hold on MRI brain.  If not doing well, will do an MRI brain.  I wonder if the balance changes are not related to his Valium.  He only takes the Valium at night and notices the balance issue especially in the morning.  In addition, it certainly could be due to a viral illness.  He has had emesis for the last few days.  I saw nothing focal or lateralizing on his examination today.  We are doing these other things, however, to make sure we are not missing anything. 3.  Low back pain  -Patient has a follow-up this afternoon with his neurosurgeon after an MRI this afternoon at the neurosurgery office. 4.  Hyperlipidemia  -Having some myalgias and am going to check a CPK just to make sure that that looks okay.  Has been on Lipitor a very long time.  Again, I wonder if he is not experiencing a viral illness and having the emesis and balance issues associated with this.  He is slowly getting better. 5.  He has a f/u already in the future.  Reminded him that he needs a primary care physician.  I told him we would call him in a week and see how he is doing.  If not doing well, we will do an MRI of the brain.  If he deteriorates or has anything focal or lateralizing, he is to go to the  emergency room.

## 2017-09-25 ENCOUNTER — Other Ambulatory Visit: Payer: BLUE CROSS/BLUE SHIELD

## 2017-09-25 ENCOUNTER — Ambulatory Visit (INDEPENDENT_AMBULATORY_CARE_PROVIDER_SITE_OTHER): Payer: BLUE CROSS/BLUE SHIELD | Admitting: Neurology

## 2017-09-25 ENCOUNTER — Telehealth: Payer: Self-pay | Admitting: Neurology

## 2017-09-25 ENCOUNTER — Encounter: Payer: Self-pay | Admitting: Neurology

## 2017-09-25 ENCOUNTER — Telehealth: Payer: Self-pay | Admitting: *Deleted

## 2017-09-25 ENCOUNTER — Ambulatory Visit
Admission: RE | Admit: 2017-09-25 | Discharge: 2017-09-25 | Disposition: A | Discharge status: Home / Self Care | Payer: BLUE CROSS/BLUE SHIELD | Source: Ambulatory Visit | Attending: Neurology | Admitting: Neurology

## 2017-09-25 VITALS — BP 128/90 | HR 58 | Ht 72.0 in | Wt 210.0 lb

## 2017-09-25 DIAGNOSIS — R270 Ataxia, unspecified: Secondary | ICD-10-CM

## 2017-09-25 DIAGNOSIS — E538 Deficiency of other specified B group vitamins: Secondary | ICD-10-CM

## 2017-09-25 DIAGNOSIS — M545 Low back pain: Secondary | ICD-10-CM | POA: Diagnosis not present

## 2017-09-25 DIAGNOSIS — R1111 Vomiting without nausea: Secondary | ICD-10-CM | POA: Diagnosis not present

## 2017-09-25 DIAGNOSIS — S22080A Wedge compression fracture of T11-T12 vertebra, initial encounter for closed fracture: Secondary | ICD-10-CM | POA: Diagnosis not present

## 2017-09-25 DIAGNOSIS — E785 Hyperlipidemia, unspecified: Secondary | ICD-10-CM

## 2017-09-25 DIAGNOSIS — G8929 Other chronic pain: Secondary | ICD-10-CM | POA: Diagnosis not present

## 2017-09-25 DIAGNOSIS — M791 Myalgia, unspecified site: Secondary | ICD-10-CM

## 2017-09-25 DIAGNOSIS — R251 Tremor, unspecified: Secondary | ICD-10-CM

## 2017-09-25 DIAGNOSIS — R2689 Other abnormalities of gait and mobility: Secondary | ICD-10-CM | POA: Diagnosis not present

## 2017-09-25 LAB — CBC WITH DIFFERENTIAL/PLATELET
BASOS PCT: 1 %
Basophils Absolute: 58 cells/uL (ref 0–200)
EOS ABS: 261 {cells}/uL (ref 15–500)
Eosinophils Relative: 4.5 %
HCT: 40.9 % (ref 38.5–50.0)
Hemoglobin: 14.5 g/dL (ref 13.2–17.1)
Lymphs Abs: 1508 cells/uL (ref 850–3900)
MCH: 34 pg — AB (ref 27.0–33.0)
MCHC: 35.5 g/dL (ref 32.0–36.0)
MCV: 95.8 fL (ref 80.0–100.0)
MONOS PCT: 12.8 %
MPV: 10.7 fL (ref 7.5–12.5)
NEUTROS PCT: 55.7 %
Neutro Abs: 3231 cells/uL (ref 1500–7800)
PLATELETS: 209 10*3/uL (ref 140–400)
RBC: 4.27 10*6/uL (ref 4.20–5.80)
RDW: 12.9 % (ref 11.0–15.0)
Total Lymphocyte: 26 %
WBC: 5.8 10*3/uL (ref 3.8–10.8)
WBCMIX: 742 {cells}/uL (ref 200–950)

## 2017-09-25 LAB — COMPLETE METABOLIC PANEL WITH GFR
AG Ratio: 1.4 (calc) (ref 1.0–2.5)
ALKALINE PHOSPHATASE (APISO): 72 U/L (ref 40–115)
ALT: 41 U/L (ref 9–46)
AST: 45 U/L — AB (ref 10–35)
Albumin: 4.2 g/dL (ref 3.6–5.1)
BILIRUBIN TOTAL: 1.2 mg/dL (ref 0.2–1.2)
BUN: 14 mg/dL (ref 7–25)
CALCIUM: 9.5 mg/dL (ref 8.6–10.3)
CO2: 27 mmol/L (ref 20–32)
CREATININE: 0.97 mg/dL (ref 0.70–1.33)
Chloride: 96 mmol/L — ABNORMAL LOW (ref 98–110)
GFR, EST NON AFRICAN AMERICAN: 89 mL/min/{1.73_m2} (ref >=60)
GFR, Est African American: 104 mL/min/{1.73_m2} (ref >=60)
GLUCOSE: 91 mg/dL (ref 65–99)
Globulin: 2.9 g/dL (calc) (ref 1.9–3.7)
POTASSIUM: 4 mmol/L (ref 3.5–5.3)
SODIUM: 134 mmol/L — AB (ref 135–146)
TOTAL PROTEIN: 7.1 g/dL (ref 6.1–8.1)

## 2017-09-25 LAB — CK: Total CK: 94 U/L (ref 44–196)

## 2017-09-25 LAB — TSH: TSH: 2.21 mIU/L (ref 0.40–4.50)

## 2017-09-25 LAB — VITAMIN B12: VITAMIN B 12: 561 pg/mL (ref 200–1100)

## 2017-09-25 NOTE — Telephone Encounter (Signed)
PA # for CT head: [id number] Good through 10-24-17

## 2017-09-25 NOTE — Patient Instructions (Signed)
Your provider has requested that you have labwork completed today. Please go to [facility] ([id number]) on the second floor of this building before leaving the office today. You do not need to check in. If you are not called within 15 minutes please check with the front desk.   

## 2017-09-25 NOTE — Telephone Encounter (Signed)
[facility] called and Lmom saying that his STAT CT of head results were in Epic. Thanks

## 2017-09-26 ENCOUNTER — Telehealth: Payer: Self-pay | Admitting: Neurology

## 2017-09-26 NOTE — Telephone Encounter (Signed)
-----   Message from Octaviano Battyebecca S Tat, DO sent at 09/25/2017  3:16 PM EST ----- Let pt know that CT looked okay.  Tell him I just wonder if all this is not viral in nature given emesis, myalgia.  Lets see how he feels.  If still unsteady in a week, can do MRI

## 2017-09-26 NOTE — Telephone Encounter (Signed)
Left message on machine for patient to call back.

## 2017-09-26 NOTE — Telephone Encounter (Signed)
Patient made aware. He states he will reach out to us if he continues to have issues. He did not want me to call him in a week.   He also wanted Dr. Arbutus Leasat to know that he saw the neurosurgeon and is scheduled for surgery on his back on 10/13/17. Dr. Arbutus LeasatLorain Childes- FYI.

## 2017-09-28 ENCOUNTER — Other Ambulatory Visit: Payer: Self-pay | Admitting: Neurosurgery

## 2017-10-02 NOTE — Pre-Procedure Instructions (Signed)
    Charles GellJohn P Villarreal  10/02/2017      [facility] - [place] - [address] [place] [id number] Phone: [phone] Fax: [phone]    Your procedure is scheduled on 10/09/17.  [facility] at 6 A.M.  Call this number if you have problems the morning of surgery:  [phone]   Remember:  Do not eat food or drink liquids after midnight.  Take these medicines the morning of surgery with A SIP OF WATER ---norvasc,percocet,inderal  Do not wear jewelry, make-up or nail polish.  Do not wear lotions, powders, or perfumes, or deodorant.  Do not shave 48 hours prior to surgery.  Men may shave face and neck.  Do not bring valuables to the hospital.  [facility] is not responsible for any belongings or valuables.  Contacts, dentures or bridgework may not be worn into surgery.  Leave your suitcase in the car.  After surgery it may be brought to your room.  For patients admitted to the hospital, discharge time will be determined by your treatment team.  Patients discharged the day of surgery will not be allowed to drive home.   Name and phone number of your driver:   Special instructions:  Do not take any aspirin,anti-inflammatories,vitamins,or herbal supplements 5-7 days prior to surgery.Please read over the following fact sheets that you were given. MRSA Information

## 2017-10-03 ENCOUNTER — Telehealth: Payer: Self-pay | Admitting: Neurology

## 2017-10-03 ENCOUNTER — Encounter (HOSPITAL_COMMUNITY): Payer: Self-pay

## 2017-10-03 ENCOUNTER — Encounter (HOSPITAL_COMMUNITY)
Admission: RE | Admit: 2017-10-03 | Discharge: 2017-10-03 | Disposition: A | Discharge status: Home / Self Care | Payer: BLUE CROSS/BLUE SHIELD | Source: Ambulatory Visit | Attending: Neurosurgery | Admitting: Neurosurgery

## 2017-10-03 DIAGNOSIS — I10 Essential (primary) hypertension: Secondary | ICD-10-CM | POA: Diagnosis not present

## 2017-10-03 DIAGNOSIS — R001 Bradycardia, unspecified: Secondary | ICD-10-CM | POA: Insufficient documentation

## 2017-10-03 DIAGNOSIS — Z01818 Encounter for other preprocedural examination: Secondary | ICD-10-CM | POA: Insufficient documentation

## 2017-10-03 DIAGNOSIS — X58XXXA Exposure to other specified factors, initial encounter: Secondary | ICD-10-CM | POA: Insufficient documentation

## 2017-10-03 DIAGNOSIS — S22009A Unspecified fracture of unspecified thoracic vertebra, initial encounter for closed fracture: Secondary | ICD-10-CM | POA: Diagnosis not present

## 2017-10-03 HISTORY — DX: Tremor, unspecified: R25.1

## 2017-10-03 HISTORY — DX: Essential (primary) hypertension: I10

## 2017-10-03 HISTORY — DX: Adverse effect of unspecified anesthetic, initial encounter: T41.45XA

## 2017-10-03 HISTORY — DX: Other complications of anesthesia, initial encounter: T88.59XA

## 2017-10-03 HISTORY — DX: Gastro-esophageal reflux disease without esophagitis: K21.9

## 2017-10-03 LAB — CBC WITH DIFFERENTIAL/PLATELET
Basophils Absolute: 0.1 10*3/uL (ref 0.0–0.1)
Basophils Relative: 1 %
Eosinophils Absolute: 0.3 10*3/uL (ref 0.0–0.7)
Eosinophils Relative: 5 %
HEMATOCRIT: 41.4 % (ref 39.0–52.0)
HEMOGLOBIN: 13.5 g/dL (ref 13.0–17.0)
LYMPHS ABS: 1.6 10*3/uL (ref 0.7–4.0)
LYMPHS PCT: 30 %
MCH: 33.2 pg (ref 26.0–34.0)
MCHC: 32.6 g/dL (ref 30.0–36.0)
MCV: 101.7 fL — AB (ref 78.0–100.0)
MONOS PCT: 12 %
Monocytes Absolute: 0.6 10*3/uL (ref 0.1–1.0)
NEUTROS ABS: 2.8 10*3/uL (ref 1.7–7.7)
NEUTROS PCT: 52 %
Platelets: 233 10*3/uL (ref 150–400)
RBC: 4.07 MIL/uL — AB (ref 4.22–5.81)
RDW: 13.1 % (ref 11.5–15.5)
WBC: 5.3 10*3/uL (ref 4.0–10.5)

## 2017-10-03 LAB — BASIC METABOLIC PANEL
ANION GAP: 11 (ref 5–15)
BUN: 9 mg/dL (ref 6–20)
CO2: 25 mmol/L (ref 22–32)
Calcium: 8.5 mg/dL — ABNORMAL LOW (ref 8.9–10.3)
Chloride: 103 mmol/L (ref 101–111)
Creatinine, Ser: 0.88 mg/dL (ref 0.61–1.24)
Glucose, Bld: 91 mg/dL (ref 65–99)
POTASSIUM: 4.5 mmol/L (ref 3.5–5.1)
SODIUM: 139 mmol/L (ref 135–145)

## 2017-10-03 LAB — SURGICAL PCR SCREEN
MRSA, PCR: NEGATIVE
Staphylococcus aureus: NEGATIVE

## 2017-10-03 LAB — TYPE AND SCREEN
ABO/RH(D): O POS
Antibody Screen: NEGATIVE

## 2017-10-03 LAB — ABO/RH: ABO/RH(D): O POS

## 2017-10-03 MED ORDER — CHLORHEXIDINE GLUCONATE CLOTH 2 % EX PADS: 6.0000 | MEDICATED_PAD | Freq: Once | CUTANEOUS | Status: DC

## 2017-10-03 NOTE — Telephone Encounter (Signed)
Patient called to let Dr. Arbutus Leasat know how he was doing. He said the tremors are better and the shakes. He said he is also having back surgery on 10/09/17. Thanks

## 2017-10-03 NOTE — Telephone Encounter (Signed)
Dr. Tat - FYI. 

## 2017-10-04 NOTE — Telephone Encounter (Signed)
Great.  Thanks

## 2017-10-09 ENCOUNTER — Encounter (HOSPITAL_COMMUNITY): Payer: Self-pay | Admitting: *Deleted

## 2017-10-09 ENCOUNTER — Encounter (HOSPITAL_COMMUNITY)
Admission: RE | Disposition: A | Discharge status: Home / Self Care | Payer: Self-pay | Source: Ambulatory Visit | Attending: Neurosurgery

## 2017-10-09 ENCOUNTER — Inpatient Hospital Stay (HOSPITAL_COMMUNITY): Payer: BLUE CROSS/BLUE SHIELD | Admitting: Certified Registered Nurse Anesthetist

## 2017-10-09 ENCOUNTER — Inpatient Hospital Stay (HOSPITAL_COMMUNITY): Payer: BLUE CROSS/BLUE SHIELD

## 2017-10-09 ENCOUNTER — Other Ambulatory Visit: Payer: Self-pay

## 2017-10-09 ENCOUNTER — Inpatient Hospital Stay (HOSPITAL_COMMUNITY)
Admission: RE | Admit: 2017-10-09 | Discharge: 2017-10-12 | DRG: 460 | Disposition: A | Discharge status: Home / Self Care | Payer: BLUE CROSS/BLUE SHIELD | Source: Ambulatory Visit | Attending: Neurosurgery | Admitting: Neurosurgery

## 2017-10-09 DIAGNOSIS — G992 Myelopathy in diseases classified elsewhere: Secondary | ICD-10-CM | POA: Diagnosis not present

## 2017-10-09 DIAGNOSIS — M4805 Spinal stenosis, thoracolumbar region: Secondary | ICD-10-CM | POA: Diagnosis not present

## 2017-10-09 DIAGNOSIS — M109 Gout, unspecified: Secondary | ICD-10-CM | POA: Diagnosis not present

## 2017-10-09 DIAGNOSIS — M549 Dorsalgia, unspecified: Secondary | ICD-10-CM | POA: Diagnosis not present

## 2017-10-09 DIAGNOSIS — M40204 Unspecified kyphosis, thoracic region: Secondary | ICD-10-CM | POA: Diagnosis not present

## 2017-10-09 DIAGNOSIS — Z96652 Presence of left artificial knee joint: Secondary | ICD-10-CM | POA: Diagnosis not present

## 2017-10-09 DIAGNOSIS — J939 Pneumothorax, unspecified: Secondary | ICD-10-CM | POA: Diagnosis not present

## 2017-10-09 DIAGNOSIS — I10 Essential (primary) hypertension: Secondary | ICD-10-CM | POA: Diagnosis present

## 2017-10-09 DIAGNOSIS — M4324 Fusion of spine, thoracic region: Secondary | ICD-10-CM | POA: Diagnosis not present

## 2017-10-09 DIAGNOSIS — G959 Disease of spinal cord, unspecified: Secondary | ICD-10-CM | POA: Diagnosis present

## 2017-10-09 DIAGNOSIS — E785 Hyperlipidemia, unspecified: Secondary | ICD-10-CM | POA: Diagnosis not present

## 2017-10-09 DIAGNOSIS — S22081A Stable burst fracture of T11-T12 vertebra, initial encounter for closed fracture: Secondary | ICD-10-CM | POA: Diagnosis present

## 2017-10-09 DIAGNOSIS — M40209 Unspecified kyphosis, site unspecified: Secondary | ICD-10-CM | POA: Diagnosis not present

## 2017-10-09 DIAGNOSIS — Z7982 Long term (current) use of aspirin: Secondary | ICD-10-CM | POA: Diagnosis not present

## 2017-10-09 DIAGNOSIS — K219 Gastro-esophageal reflux disease without esophagitis: Secondary | ICD-10-CM | POA: Diagnosis not present

## 2017-10-09 DIAGNOSIS — D869 Sarcoidosis, unspecified: Secondary | ICD-10-CM | POA: Diagnosis not present

## 2017-10-09 DIAGNOSIS — Z8249 Family history of ischemic heart disease and other diseases of the circulatory system: Secondary | ICD-10-CM

## 2017-10-09 DIAGNOSIS — S22081S Stable burst fracture of T11-T12 vertebra, sequela: Secondary | ICD-10-CM | POA: Diagnosis not present

## 2017-10-09 DIAGNOSIS — B009 Herpesviral infection, unspecified: Secondary | ICD-10-CM | POA: Diagnosis not present

## 2017-10-09 DIAGNOSIS — Z87891 Personal history of nicotine dependence: Secondary | ICD-10-CM | POA: Diagnosis not present

## 2017-10-09 DIAGNOSIS — Z419 Encounter for procedure for purposes other than remedying health state, unspecified: Secondary | ICD-10-CM

## 2017-10-09 DIAGNOSIS — M4804 Spinal stenosis, thoracic region: Secondary | ICD-10-CM | POA: Diagnosis not present

## 2017-10-09 HISTORY — PX: ANTERIOR LAT LUMBAR FUSION: SHX1168

## 2017-10-09 LAB — CBC
HCT: 34.9 % — ABNORMAL LOW (ref 39.0–52.0)
HEMOGLOBIN: 11.5 g/dL — AB (ref 13.0–17.0)
MCH: 33.7 pg (ref 26.0–34.0)
MCHC: 33 g/dL (ref 30.0–36.0)
MCV: 102.3 fL — AB (ref 78.0–100.0)
Platelets: 231 10*3/uL (ref 150–400)
RBC: 3.41 MIL/uL — ABNORMAL LOW (ref 4.22–5.81)
RDW: 13.1 % (ref 11.5–15.5)
WBC: 7.9 10*3/uL (ref 4.0–10.5)

## 2017-10-09 SURGERY — ANTERIOR LATERAL LUMBAR FUSION 2 LEVELS
Anesthesia: General | Site: Flank

## 2017-10-09 MED ORDER — BUPIVACAINE-EPINEPHRINE 0.25% -1:200000 IJ SOLN
INTRAMUSCULAR | Status: AC
  Filled 2017-10-09: qty 1

## 2017-10-09 MED ORDER — ONDANSETRON HCL 4 MG/2ML IJ SOLN: 4.0000 mg | Freq: Four times a day (QID) | INTRAMUSCULAR | Status: DC | PRN

## 2017-10-09 MED ORDER — PHENOL 1.4 % MT LIQD: 1.0000 | OROMUCOSAL | Status: DC | PRN

## 2017-10-09 MED ORDER — MENTHOL 3 MG MT LOZG: 1.0000 | LOZENGE | OROMUCOSAL | Status: DC | PRN

## 2017-10-09 MED ORDER — PROPOFOL 10 MG/ML IV BOLUS
INTRAVENOUS | Status: AC
  Filled 2017-10-09: qty 20

## 2017-10-09 MED ORDER — LIDOCAINE 2% (20 MG/ML) 5 ML SYRINGE
INTRAMUSCULAR | Status: AC
  Filled 2017-10-09: qty 5

## 2017-10-09 MED ORDER — SODIUM CHLORIDE 0.9 % IV SOLN: 250.0000 mL | INTRAVENOUS | Status: DC

## 2017-10-09 MED ORDER — ROCURONIUM BROMIDE 100 MG/10ML IV SOLN
INTRAVENOUS | Status: DC | PRN
  Administered 2017-10-09 (×2): 30 mg via INTRAVENOUS
  Administered 2017-10-09: 20 mg via INTRAVENOUS
  Administered 2017-10-09: 30 mg via INTRAVENOUS
  Administered 2017-10-09 (×2): 50 mg via INTRAVENOUS

## 2017-10-09 MED ORDER — BUPIVACAINE HCL (PF) 0.25 % IJ SOLN
INTRAMUSCULAR | Status: AC
  Filled 2017-10-09: qty 30

## 2017-10-09 MED ORDER — FENTANYL CITRATE (PF) 100 MCG/2ML IJ SOLN
25.0000 ug | INTRAMUSCULAR | Status: DC | PRN
  Administered 2017-10-09 (×2): 50 ug via INTRAVENOUS

## 2017-10-09 MED ORDER — PROPOFOL 10 MG/ML IV BOLUS
INTRAVENOUS | Status: DC | PRN
  Administered 2017-10-09: 160 mg via INTRAVENOUS

## 2017-10-09 MED ORDER — THROMBIN 5000 UNITS EX SOLR
CUTANEOUS | Status: AC
  Filled 2017-10-09: qty 5000

## 2017-10-09 MED ORDER — PHENYLEPHRINE 40 MCG/ML (10ML) SYRINGE FOR IV PUSH (FOR BLOOD PRESSURE SUPPORT)
PREFILLED_SYRINGE | INTRAVENOUS | Status: AC
  Filled 2017-10-09: qty 20

## 2017-10-09 MED ORDER — CEFAZOLIN SODIUM-DEXTROSE 1-4 GM/50ML-% IV SOLN
1.0000 g | Freq: Three times a day (TID) | INTRAVENOUS | Status: AC
  Administered 2017-10-09 – 2017-10-10 (×2): 1 g via INTRAVENOUS
  Filled 2017-10-09 (×2): qty 50

## 2017-10-09 MED ORDER — THROMBIN 20000 UNITS EX SOLR
CUTANEOUS | Status: AC
  Filled 2017-10-09: qty 20000

## 2017-10-09 MED ORDER — SUCCINYLCHOLINE CHLORIDE 200 MG/10ML IV SOSY
PREFILLED_SYRINGE | INTRAVENOUS | Status: AC
  Filled 2017-10-09: qty 10

## 2017-10-09 MED ORDER — ACETAMINOPHEN 650 MG RE SUPP
650.0000 mg | RECTAL | Status: DC | PRN
Start: 2017-10-09 — End: 2017-10-12

## 2017-10-09 MED ORDER — FENTANYL CITRATE (PF) 100 MCG/2ML IJ SOLN
INTRAMUSCULAR | Status: DC | PRN
  Administered 2017-10-09: 50 ug via INTRAVENOUS
  Administered 2017-10-09: 100 ug via INTRAVENOUS
  Administered 2017-10-09 (×2): 50 ug via INTRAVENOUS
  Administered 2017-10-09: 100 ug via INTRAVENOUS
  Administered 2017-10-09 (×2): 50 ug via INTRAVENOUS

## 2017-10-09 MED ORDER — SODIUM CHLORIDE 0.9 % IV SOLN: INTRAVENOUS | Status: DC

## 2017-10-09 MED ORDER — ONDANSETRON HCL 4 MG/2ML IJ SOLN
INTRAMUSCULAR | Status: DC | PRN
  Administered 2017-10-09: 4 mg via INTRAVENOUS

## 2017-10-09 MED ORDER — FENTANYL CITRATE (PF) 250 MCG/5ML IJ SOLN
INTRAMUSCULAR | Status: AC
  Filled 2017-10-09: qty 5

## 2017-10-09 MED ORDER — OXYMETAZOLINE HCL 0.05 % NA SOLN: 1.0000 | Freq: Every day | NASAL | Status: DC | PRN

## 2017-10-09 MED ORDER — PROPRANOLOL HCL ER 120 MG PO CP24
120.0000 mg | ORAL_CAPSULE | Freq: Every day | ORAL | Status: DC
  Administered 2017-10-10 – 2017-10-12 (×3): 120 mg via ORAL
  Filled 2017-10-09 (×3): qty 1

## 2017-10-09 MED ORDER — PHENYLEPHRINE 40 MCG/ML (10ML) SYRINGE FOR IV PUSH (FOR BLOOD PRESSURE SUPPORT)
PREFILLED_SYRINGE | INTRAVENOUS | Status: DC | PRN
  Administered 2017-10-09 (×6): 80 ug via INTRAVENOUS

## 2017-10-09 MED ORDER — PROTAMINE SULFATE 10 MG/ML IV SOLN
INTRAVENOUS | Status: AC
  Filled 2017-10-09: qty 50

## 2017-10-09 MED ORDER — EPHEDRINE SULFATE-NACL 50-0.9 MG/10ML-% IV SOSY
PREFILLED_SYRINGE | INTRAVENOUS | Status: DC | PRN
  Administered 2017-10-09: 10 mg via INTRAVENOUS
  Administered 2017-10-09: 5 mg via INTRAVENOUS

## 2017-10-09 MED ORDER — FENTANYL CITRATE (PF) 100 MCG/2ML IJ SOLN: 25.0000 ug | INTRAMUSCULAR | Status: DC | PRN

## 2017-10-09 MED ORDER — LACTATED RINGERS IV SOLN
INTRAVENOUS | Status: DC | PRN
  Administered 2017-10-09 (×3): via INTRAVENOUS

## 2017-10-09 MED ORDER — ROCURONIUM BROMIDE 10 MG/ML (PF) SYRINGE
PREFILLED_SYRINGE | INTRAVENOUS | Status: AC
Start: 2017-10-09 — End: 2017-10-09
  Filled 2017-10-09: qty 10

## 2017-10-09 MED ORDER — ALUM & MAG HYDROXIDE-SIMETH 200-200-20 MG/5ML PO SUSP
30.0000 mL | Freq: Four times a day (QID) | ORAL | Status: DC | PRN
Start: 2017-10-09 — End: 2017-10-12
  Administered 2017-10-09: 30 mL via ORAL
  Filled 2017-10-09: qty 30

## 2017-10-09 MED ORDER — SODIUM CHLORIDE 0.9% FLUSH: 3.0000 mL | INTRAVENOUS | Status: DC | PRN

## 2017-10-09 MED ORDER — PROTAMINE SULFATE 10 MG/ML IV SOLN
INTRAVENOUS | Status: AC
  Filled 2017-10-09: qty 10

## 2017-10-09 MED ORDER — MIDAZOLAM HCL 2 MG/2ML IJ SOLN
INTRAMUSCULAR | Status: AC
  Filled 2017-10-09: qty 2

## 2017-10-09 MED ORDER — ROCURONIUM BROMIDE 10 MG/ML (PF) SYRINGE
PREFILLED_SYRINGE | INTRAVENOUS | Status: AC
  Filled 2017-10-09: qty 5

## 2017-10-09 MED ORDER — LIDOCAINE HCL (CARDIAC) 20 MG/ML IV SOLN
INTRAVENOUS | Status: DC | PRN
  Administered 2017-10-09: 80 mg via INTRAVENOUS

## 2017-10-09 MED ORDER — FAMOTIDINE 20 MG PO TABS
20.0000 mg | ORAL_TABLET | Freq: Two times a day (BID) | ORAL | Status: DC
  Administered 2017-10-09 – 2017-10-12 (×6): 20 mg via ORAL
  Filled 2017-10-09 (×6): qty 1

## 2017-10-09 MED ORDER — THROMBIN 5000 UNITS EX SOLR
CUTANEOUS | Status: AC
  Filled 2017-10-09: qty 10000

## 2017-10-09 MED ORDER — DEXMEDETOMIDINE HCL IN NACL 200 MCG/50ML IV SOLN
INTRAVENOUS | Status: DC | PRN
  Administered 2017-10-09: 8 ug via INTRAVENOUS
  Administered 2017-10-09: 12 ug via INTRAVENOUS

## 2017-10-09 MED ORDER — ALBUMIN HUMAN 5 % IV SOLN
INTRAVENOUS | Status: DC | PRN
  Administered 2017-10-09 (×2): via INTRAVENOUS

## 2017-10-09 MED ORDER — MELATONIN 3 MG PO TABS
3.0000 mg | ORAL_TABLET | Freq: Every day | ORAL | Status: DC
  Administered 2017-10-09 – 2017-10-11 (×3): 3 mg via ORAL
  Filled 2017-10-09 (×3): qty 1

## 2017-10-09 MED ORDER — DIAZEPAM 5 MG PO TABS
5.0000 mg | ORAL_TABLET | Freq: Four times a day (QID) | ORAL | Status: DC | PRN
  Administered 2017-10-09 – 2017-10-10 (×4): 10 mg via ORAL
  Administered 2017-10-11 (×2): 5 mg via ORAL
  Filled 2017-10-09 (×2): qty 1
  Filled 2017-10-09 (×4): qty 2

## 2017-10-09 MED ORDER — LIDOCAINE 2% (20 MG/ML) 5 ML SYRINGE
INTRAMUSCULAR | Status: AC
  Filled 2017-10-09: qty 10

## 2017-10-09 MED ORDER — ACETAMINOPHEN 325 MG PO TABS: 650.0000 mg | ORAL_TABLET | ORAL | Status: DC | PRN

## 2017-10-09 MED ORDER — THROMBIN (RECOMBINANT) 20000 UNITS EX SOLR
CUTANEOUS | Status: DC | PRN
  Administered 2017-10-09: 10:00:00 via TOPICAL

## 2017-10-09 MED ORDER — ONDANSETRON HCL 4 MG/2ML IJ SOLN
INTRAMUSCULAR | Status: AC
  Filled 2017-10-09: qty 2

## 2017-10-09 MED ORDER — SODIUM CHLORIDE 0.9% FLUSH
3.0000 mL | Freq: Two times a day (BID) | INTRAVENOUS | Status: DC
  Administered 2017-10-10 – 2017-10-12 (×5): 3 mL via INTRAVENOUS

## 2017-10-09 MED ORDER — THROMBIN (RECOMBINANT) 5000 UNITS EX SOLR
OROMUCOSAL | Status: DC | PRN
  Administered 2017-10-09: 5 mL via TOPICAL
  Administered 2017-10-09 (×2): via TOPICAL

## 2017-10-09 MED ORDER — BUPIVACAINE-EPINEPHRINE 0.25% -1:200000 IJ SOLN
INTRAMUSCULAR | Status: DC | PRN
  Administered 2017-10-09: 20 mL

## 2017-10-09 MED ORDER — BISACODYL 10 MG RE SUPP
10.0000 mg | Freq: Every day | RECTAL | Status: DC | PRN
Start: 2017-10-09 — End: 2017-10-12
  Administered 2017-10-10: 10 mg via RECTAL
  Filled 2017-10-09: qty 1

## 2017-10-09 MED ORDER — OXYCODONE HCL 5 MG PO TABS
10.0000 mg | ORAL_TABLET | ORAL | Status: DC | PRN
  Administered 2017-10-09 – 2017-10-12 (×14): 10 mg via ORAL
  Filled 2017-10-09 (×14): qty 2

## 2017-10-09 MED ORDER — PHENYLEPHRINE HCL 10 MG/ML IJ SOLN
INTRAVENOUS | Status: DC | PRN
  Administered 2017-10-09: 40 ug/min via INTRAVENOUS

## 2017-10-09 MED ORDER — PRIMIDONE 50 MG PO TABS
50.0000 mg | ORAL_TABLET | Freq: Every day | ORAL | Status: DC
  Administered 2017-10-09 – 2017-10-11 (×3): 50 mg via ORAL
  Filled 2017-10-09 (×3): qty 1

## 2017-10-09 MED ORDER — FENTANYL CITRATE (PF) 100 MCG/2ML IJ SOLN
INTRAMUSCULAR | Status: AC
  Filled 2017-10-09: qty 2

## 2017-10-09 MED ORDER — BUPIVACAINE-EPINEPHRINE (PF) 0.5% -1:200000 IJ SOLN
INTRAMUSCULAR | Status: AC
  Filled 2017-10-09: qty 30

## 2017-10-09 MED ORDER — VITAMIN C 500 MG PO TABS: 1000.0000 mg | ORAL_TABLET | Freq: Every day | ORAL | Status: DC

## 2017-10-09 MED ORDER — BACITRACIN 50000 UNITS IM SOLR
INTRAMUSCULAR | Status: DC | PRN
  Administered 2017-10-09: 09:00:00

## 2017-10-09 MED ORDER — POLYETHYLENE GLYCOL 3350 17 G PO PACK
17.0000 g | PACK | Freq: Every day | ORAL | Status: DC | PRN
  Administered 2017-10-11: 17 g via ORAL

## 2017-10-09 MED ORDER — MIDAZOLAM HCL 5 MG/5ML IJ SOLN
INTRAMUSCULAR | Status: DC | PRN
  Administered 2017-10-09: 2 mg via INTRAVENOUS

## 2017-10-09 MED ORDER — FLEET ENEMA 7-19 GM/118ML RE ENEM: 1.0000 | ENEMA | Freq: Once | RECTAL | Status: DC | PRN

## 2017-10-09 MED ORDER — DEXAMETHASONE SODIUM PHOSPHATE 10 MG/ML IJ SOLN
10.0000 mg | INTRAMUSCULAR | Status: AC
  Administered 2017-10-09: 10 mg via INTRAVENOUS
  Filled 2017-10-09: qty 1

## 2017-10-09 MED ORDER — PROPOFOL 1000 MG/100ML IV EMUL
INTRAVENOUS | Status: AC
  Filled 2017-10-09: qty 100

## 2017-10-09 MED ORDER — SUGAMMADEX SODIUM 500 MG/5ML IV SOLN
INTRAVENOUS | Status: DC | PRN
  Administered 2017-10-09: 300 mg via INTRAVENOUS

## 2017-10-09 MED ORDER — ONDANSETRON HCL 4 MG PO TABS: 4.0000 mg | ORAL_TABLET | Freq: Four times a day (QID) | ORAL | Status: DC | PRN

## 2017-10-09 MED ORDER — DEXAMETHASONE SODIUM PHOSPHATE 10 MG/ML IJ SOLN
INTRAMUSCULAR | Status: AC
  Filled 2017-10-09: qty 1

## 2017-10-09 MED ORDER — GLYCOPYRROLATE 0.2 MG/ML IV SOSY
PREFILLED_SYRINGE | INTRAVENOUS | Status: DC | PRN
  Administered 2017-10-09: .1 mg via INTRAVENOUS

## 2017-10-09 MED ORDER — HYDROMORPHONE HCL 1 MG/ML IJ SOLN
1.0000 mg | INTRAMUSCULAR | Status: DC | PRN
  Administered 2017-10-09 – 2017-10-10 (×5): 1 mg via INTRAVENOUS
  Filled 2017-10-09 (×5): qty 1

## 2017-10-09 MED ORDER — ONDANSETRON HCL 4 MG/2ML IJ SOLN
INTRAMUSCULAR | Status: AC
  Filled 2017-10-09: qty 6

## 2017-10-09 MED ORDER — CEFAZOLIN SODIUM-DEXTROSE 2-4 GM/100ML-% IV SOLN
2.0000 g | INTRAVENOUS | Status: AC
  Administered 2017-10-09: 2 g via INTRAVENOUS
  Filled 2017-10-09: qty 100

## 2017-10-09 MED ORDER — ARTIFICIAL TEARS OPHTHALMIC OINT
TOPICAL_OINTMENT | OPHTHALMIC | Status: DC | PRN
  Administered 2017-10-09: 1 via OPHTHALMIC

## 2017-10-09 MED ORDER — SUGAMMADEX SODIUM 500 MG/5ML IV SOLN
INTRAVENOUS | Status: AC
  Filled 2017-10-09: qty 5

## 2017-10-09 MED ORDER — 0.9 % SODIUM CHLORIDE (POUR BTL) OPTIME
TOPICAL | Status: DC | PRN
  Administered 2017-10-09: 1000 mL

## 2017-10-09 MED ORDER — HYDROCODONE-ACETAMINOPHEN 10-325 MG PO TABS
1.0000 | ORAL_TABLET | ORAL | Status: DC | PRN
  Administered 2017-10-10 (×2): 1 via ORAL
  Filled 2017-10-09 (×2): qty 1

## 2017-10-09 SURGICAL SUPPLY — 86 items
ADH SKN CLS APL DERMABOND .7 (GAUZE/BANDAGES/DRESSINGS) ×1
APL SKNCLS STERI-STRIP NONHPOA (GAUZE/BANDAGES/DRESSINGS) ×1
BAG DECANTER FOR FLEXI CONT (MISCELLANEOUS) ×3 IMPLANT
BASKET BONE COLLECTION (BASKET) ×2 IMPLANT
BENZOIN TINCTURE PRP APPL 2/3 (GAUZE/BANDAGES/DRESSINGS) ×3 IMPLANT
BLADE CLIPPER SURG (BLADE) ×2 IMPLANT
BOLT 5.5X55 (Bolt) ×2 IMPLANT
BUR MATCHSTICK NEURO 3.0 LAGG (BURR) ×2 IMPLANT
BUR ROUND FLUTED 5 RND (BURR) ×1 IMPLANT
BUR ROUND FLUTED 5MM RND (BURR) ×1
CAGE XCORE II TI 20-25 18 CORE (Spacer) IMPLANT
CAP END 18X15MM XCORE 2 (Cap) IMPLANT
CAP END 2 TI LOCK SCREW X-CORE (Screw) IMPLANT
CARTRIDGE OIL MAESTRO DRILL (MISCELLANEOUS) ×1 IMPLANT
CATH ROBINSON RED A/P 14FR (CATHETERS) ×2 IMPLANT
CLOSURE WOUND 1/2 X4 (GAUZE/BANDAGES/DRESSINGS) ×1
CONT SPEC 4OZ CLIKSEAL STRL BL (MISCELLANEOUS) ×2 IMPLANT
CORE XCORE TI 18X20-25MM AUTO (Spacer) ×3 IMPLANT
COVER BACK TABLE 24X17X13 BIG (DRAPES) IMPLANT
DERMABOND ADVANCED (GAUZE/BANDAGES/DRESSINGS) ×2
DERMABOND ADVANCED .7 DNX12 (GAUZE/BANDAGES/DRESSINGS) ×2 IMPLANT
DIFFUSER DRILL AIR PNEUMATIC (MISCELLANEOUS) ×3 IMPLANT
DRAPE C-ARM 42X72 X-RAY (DRAPES) ×3 IMPLANT
DRAPE C-ARMOR (DRAPES) ×5 IMPLANT
DRAPE LAPAROTOMY 100X72X124 (DRAPES) ×3 IMPLANT
DRAPE MICROSCOPE LEICA (MISCELLANEOUS) ×2 IMPLANT
DRAPE POUCH INSTRU U-SHP 10X18 (DRAPES) ×3 IMPLANT
DRAPE SURG 17X23 STRL (DRAPES) ×4 IMPLANT
DRSG OPSITE POSTOP 4X8 (GAUZE/BANDAGES/DRESSINGS) ×2 IMPLANT
ELECT BLADE 4.0 EZ CLEAN MEGAD (MISCELLANEOUS) ×3
ELECT BLADE 6.5 EXT (BLADE) ×2 IMPLANT
ELECT REM PT RETURN 9FT ADLT (ELECTROSURGICAL) ×3
ELECTRODE BLDE 4.0 EZ CLN MEGD (MISCELLANEOUS) IMPLANT
ELECTRODE REM PT RTRN 9FT ADLT (ELECTROSURGICAL) ×1 IMPLANT
ENDCAP XCORE 2 18X15MM (Cap) ×4 IMPLANT
EVACUATOR 1/8 PVC DRAIN (DRAIN) ×2 IMPLANT
GAUZE SPONGE 4X4 16PLY XRAY LF (GAUZE/BANDAGES/DRESSINGS) IMPLANT
GLOVE BIOGEL PI IND STRL 6.5 (GLOVE) IMPLANT
GLOVE BIOGEL PI IND STRL 7.5 (GLOVE) IMPLANT
GLOVE BIOGEL PI INDICATOR 6.5 (GLOVE) ×8
GLOVE BIOGEL PI INDICATOR 7.5 (GLOVE) ×2
GLOVE ECLIPSE 7.0 STRL STRAW (GLOVE) ×2 IMPLANT
GLOVE ECLIPSE 9.0 STRL (GLOVE) ×5 IMPLANT
GLOVE EXAM NITRILE LRG STRL (GLOVE) IMPLANT
GLOVE EXAM NITRILE XL STR (GLOVE) IMPLANT
GLOVE EXAM NITRILE XS STR PU (GLOVE) IMPLANT
GLOVE SURG SS PI 6.0 STRL IVOR (GLOVE) ×4 IMPLANT
GLOVE SURG SS PI 6.5 STRL IVOR (GLOVE) ×12 IMPLANT
GOWN STRL REUS W/ TWL LRG LVL3 (GOWN DISPOSABLE) IMPLANT
GOWN STRL REUS W/ TWL XL LVL3 (GOWN DISPOSABLE) ×1 IMPLANT
GOWN STRL REUS W/TWL 2XL LVL3 (GOWN DISPOSABLE) IMPLANT
GOWN STRL REUS W/TWL LRG LVL3 (GOWN DISPOSABLE) ×15
GOWN STRL REUS W/TWL XL LVL3 (GOWN DISPOSABLE) ×6
HEMOSTAT POWDER KIT SURGIFOAM (HEMOSTASIS) ×6 IMPLANT
KIT BASIN OR (CUSTOM PROCEDURE TRAY) ×3 IMPLANT
KIT DILATOR XLIF 5 (KITS) ×1 IMPLANT
KIT ROOM TURNOVER OR (KITS) ×3 IMPLANT
KIT SURGICAL ACCESS MAXCESS 4 (KITS) ×2 IMPLANT
KIT XLIF (KITS) ×1
MARKER SKIN DUAL TIP RULER LAB (MISCELLANEOUS) ×2 IMPLANT
NDL HYPO 25X1 1.5 SAFETY (NEEDLE) ×1 IMPLANT
NEEDLE HYPO 25X1 1.5 SAFETY (NEEDLE) ×3 IMPLANT
NS IRRIG 1000ML POUR BTL (IV SOLUTION) ×3 IMPLANT
OIL CARTRIDGE MAESTRO DRILL (MISCELLANEOUS) ×3
PACK LAMINECTOMY NEURO (CUSTOM PROCEDURE TRAY) ×3 IMPLANT
PATTIES SURGICAL 1X1 (DISPOSABLE) ×2 IMPLANT
PLATE TRAVERSE 30MM (Plate) ×2 IMPLANT
RUBBERBAND STERILE (MISCELLANEOUS) ×4 IMPLANT
SCREW SET ENDCAP (Screw) ×8 IMPLANT
SCREW SPINAL 6.5X50 F/FIX XLP (Screw) ×2 IMPLANT
SCREW TRAVERSE 5.5X50 (Screw) ×8 IMPLANT
SPONGE INTESTINAL PEANUT (DISPOSABLE) ×4 IMPLANT
SPONGE LAP 18X18 X RAY DECT (DISPOSABLE) ×2 IMPLANT
SPONGE LAP 4X18 X RAY DECT (DISPOSABLE) IMPLANT
SPONGE SURGIFOAM ABS GEL 100 (HEMOSTASIS) ×2 IMPLANT
STAPLER SKIN PROX WIDE 3.9 (STAPLE) ×2 IMPLANT
STRIP CLOSURE SKIN 1/2X4 (GAUZE/BANDAGES/DRESSINGS) ×2 IMPLANT
SUT VIC AB 2-0 CT1 18 (SUTURE) ×6 IMPLANT
SUT VIC AB 3-0 SH 8-18 (SUTURE) ×6 IMPLANT
SWABSTICK BENZOIN STERILE (MISCELLANEOUS) ×3 IMPLANT
SYR BULB IRRIGATION 50ML (SYRINGE) ×2 IMPLANT
TOWEL GREEN STERILE (TOWEL DISPOSABLE) ×3 IMPLANT
TOWEL GREEN STERILE FF (TOWEL DISPOSABLE) ×3 IMPLANT
TRAY FOLEY W/METER SILVER 16FR (SET/KITS/TRAYS/PACK) ×3 IMPLANT
WATER STERILE IRR 1000ML POUR (IV SOLUTION) ×3 IMPLANT
YANKAUER SUCT BULB TIP NO VENT (SUCTIONS) ×2 IMPLANT

## 2017-10-09 NOTE — Anesthesia Procedure Notes (Deleted)
Performed by: Brennen Gardiner F, CRNA       

## 2017-10-09 NOTE — Anesthesia Preprocedure Evaluation (Addendum)
Anesthesia Evaluation  Patient identified by MRN, date of birth, ID band Patient awake    Reviewed: Allergy & Precautions, NPO status , Patient's Chart, lab work & pertinent test results  Airway Mallampati: II  TM Distance: >3 FB     Dental  (+) Teeth Intact, Dental Advisory Given   Pulmonary neg pulmonary ROS,    breath sounds clear to auscultation       Cardiovascular hypertension,  Rhythm:Regular Rate:Normal     Neuro/Psych    GI/Hepatic Neg liver ROS, GERD  ,  Endo/Other  negative endocrine ROS  Renal/GU negative Renal ROS     Musculoskeletal   Abdominal   Peds  Hematology   Anesthesia Other Findings   Reproductive/Obstetrics                            Anesthesia Physical Anesthesia Plan  ASA: III  Anesthesia Plan: General   Post-op Pain Management:    Induction: Intravenous  PONV Risk Score and Plan: 2 and Treatment may vary due to age or medical condition, Dexamethasone, Ondansetron and Midazolam  Airway Management Planned: Oral ETT  Additional Equipment:   Intra-op Plan:   Post-operative Plan: Extubation in OR  Informed Consent: I have reviewed the patients History and Physical, chart, labs and discussed the procedure including the risks, benefits and alternatives for the proposed anesthesia with the patient or authorized representative who has indicated his/her understanding and acceptance.   Dental advisory given  Plan Discussed with: CRNA and Anesthesiologist  Anesthesia Plan Comments:         Anesthesia Quick Evaluation
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
## 2017-10-09 NOTE — Transfer of Care (Signed)
Immediate Anesthesia Transfer of Care Note  Patient: Aloha GellJohn P Gorder  Procedure(s) Performed: Thoracic twelve Corpectomy, Thoracic eleven-Lumbar one  lateral plate,   Anterior Lateral Interbody Fusion with Corpectomy Cage  (N/A Flank)  Patient Location: PACU  Anesthesia Type:General  Level of Consciousness: awake, oriented and patient cooperative  Airway & Oxygen Therapy: Patient Spontanous Breathing and Patient connected to nasal cannula oxygen  Post-op Assessment: Report given to RN, Post -op Vital signs reviewed and stable and Patient moving all extremities X 4  Post vital signs: Reviewed and stable  Last Vitals:  Vitals:   10/09/17 0626  BP: (!) 144/96  Pulse: (!) 54  Temp: 36.6 C  SpO2: 99%    Last Pain:  Vitals:   10/09/17 0633  PainSc: 7          Complications: No apparent anesthesia complications

## 2017-10-09 NOTE — Progress Notes (Signed)
Orthopedic Tech Progress Note Patient Details:  [facility] 02/14/1966 [id number] Patient already has brace. Patient ID: Jodi GeraldsJohn P Mentzer, male   DOB: 09/18/1965, 52 y.o.   MRN: [id number]   Jennye MoccasinHughes, Kayne Yuhas Craig 10/09/2017, 4:44 PM

## 2017-10-09 NOTE — Anesthesia Postprocedure Evaluation (Signed)
Anesthesia Post Note  Patient: Charles Villarreal  Procedure(s) Performed: Thoracic twelve Corpectomy, Thoracic eleven-Lumbar one  lateral plate,   Anterior Lateral Interbody Fusion with Corpectomy Cage  (N/A Flank)     Patient location during evaluation: PACU Anesthesia Type: General Level of consciousness: awake Pain management: pain level controlled Vital Signs Assessment: post-procedure vital signs reviewed and stable Respiratory status: spontaneous breathing Cardiovascular status: stable Anesthetic complications: no    Last Vitals:  Vitals:   10/09/17 1430 10/09/17 1500  BP: 106/67 95/71  Pulse: 61 62  Resp: 14 15  Temp:    SpO2: 100% 100%    Last Pain:  Vitals:   10/09/17 1500  PainSc: 0-No pain                 Vaida Kerchner

## 2017-10-09 NOTE — Anesthesia Procedure Notes (Addendum)
Procedure Name: Intubation Date/Time: 10/09/2017 8:08 AM Performed by: Orlie Dakin, CRNA Pre-anesthesia Checklist: Patient identified, Emergency Drugs available, Suction available, Patient being monitored and Timeout performed Patient Re-evaluated:Patient Re-evaluated prior to induction Oxygen Delivery Method: Circle system utilized Preoxygenation: Pre-oxygenation with 100% oxygen Induction Type: IV induction Ventilation: Mask ventilation without difficulty Laryngoscope Size: Mac and 4 Grade View: Grade I Tube type: Oral Tube size: 7.5 mm Number of attempts: 1 Airway Equipment and Method: Stylet Placement Confirmation: ETT inserted through vocal cords under direct vision,  positive ETCO2 and breath sounds checked- equal and bilateral Secured at: 23 cm Tube secured with: Tape Dental Injury: Teeth and Oropharynx as per pre-operative assessment

## 2017-10-09 NOTE — Brief Op Note (Signed)
10/09/2017  12:57 PM  PATIENT:  Charles Villarreal  52 y.o. male  PRE-OPERATIVE DIAGNOSIS:  Compression fracture, Thoracic  POST-OPERATIVE DIAGNOSIS:  Compression fracture, Thoracic  PROCEDURE:  Procedure(s): Thoracic twelve Corpectomy, Thoracic eleven-Lumbar one  lateral plate,   Anterior Lateral Interbody Fusion with Corpectomy Cage  (N/A)  SURGEON:  Surgeon(s) and Role:    * Julio SicksPool, Mysti Haley, MD - Primary    * Lisbeth RenshawNundkumar, Neelesh, MD - Assisting  PHYSICIAN ASSISTANT:   ASSISTANTS:    ANESTHESIA:   general  EBL:  800 mL   BLOOD ADMINISTERED:none  DRAINS: (med) Hemovact drain(s) in the retroperitoneal/pleural space with  Suction Open   LOCAL MEDICATIONS USED:  MARCAINE     SPECIMEN:  No Specimen  DISPOSITION OF SPECIMEN:  N/A  COUNTS:  YES  TOURNIQUET:  * No tourniquets in log *  DICTATION: .Dragon Dictation  PLAN OF CARE: Admit to inpatient   PATIENT DISPOSITION:  PACU - hemodynamically stable.   Delay start of Pharmacological VTE agent (>24hrs) due to surgical blood loss or risk of bleeding: yes

## 2017-10-10 LAB — CBC
HEMATOCRIT: 29.9 % — AB (ref 39.0–52.0)
HEMOGLOBIN: 9.8 g/dL — AB (ref 13.0–17.0)
MCH: 34 pg (ref 26.0–34.0)
MCHC: 32.8 g/dL (ref 30.0–36.0)
MCV: 103.8 fL — ABNORMAL HIGH (ref 78.0–100.0)
Platelets: 216 10*3/uL (ref 150–400)
RBC: 2.88 MIL/uL — ABNORMAL LOW (ref 4.22–5.81)
RDW: 13.3 % (ref 11.5–15.5)
WBC: 7.6 10*3/uL (ref 4.0–10.5)

## 2017-10-10 MED ORDER — KETOROLAC TROMETHAMINE 30 MG/ML IJ SOLN
30.0000 mg | Freq: Four times a day (QID) | INTRAMUSCULAR | Status: DC | PRN
  Administered 2017-10-10 – 2017-10-11 (×4): 30 mg via INTRAVENOUS
  Filled 2017-10-10 (×4): qty 1

## 2017-10-10 MED FILL — Gelatin Absorbable MT Powder: OROMUCOSAL | Qty: 1 | Status: AC

## 2017-10-10 MED FILL — Thrombin For Soln 5000 Unit: CUTANEOUS | Qty: 5000 | Status: AC

## 2017-10-10 MED FILL — Thrombin For Soln 20000 Unit: CUTANEOUS | Qty: 1 | Status: AC
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
## 2017-10-10 NOTE — Progress Notes (Signed)
Postop day 1. Patient doing well.  Back pain controlled.  No radicular pain.  No numbness paresthesias or weakness.  Has not   Mobilized yet.  Afebrile.  Vital signs are stable.  Drain output minimum.  Urine output good.  Follow-up hematocrit 29.9.  Chest benign.  Abdomen soft.  Motor and sensory function intact.    Progressing well following T12 corpectomy and fusion.  Plan mobilize a shin today.
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
## 2017-10-11 ENCOUNTER — Encounter (HOSPITAL_COMMUNITY): Payer: Self-pay | Admitting: Neurosurgery

## 2017-10-11 NOTE — Progress Notes (Signed)
Doing better today.  Pain better controlled.  Able to ambulate around the room with minimal assistance.  Patient still looks a little bit shaky.  He is afebrile.  His vital signs are stable.  His urine output is good.  Drain output is minimal.  Positive flatus.  Wound clean and dry.  Chest clear bilaterally.  Abdomen soft.  Motor and sensory function of the extremities intact.  Progressing well.  Continue efforts at mobilization.  Work with therapy again today.  Probable discharge home tomorrow.
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
## 2017-10-11 NOTE — Progress Notes (Signed)
PT Cancellation Note  Patient Details Name: Charles GeraldsJohn P Villarreal MRN: [id number] DOB: 11/03/1965   Cancelled Treatment:    Reason Eval/Treat Not Completed: Other (comment)(pt reports fatigue and pain, request to sleep a little longer, will plan to attempt again)   Lannis Lichtenwalner B Scotland Korver 10/11/2017, 12:37 PM  Delaney MeigsMaija Tabor Larya Charpentier, PT [phone]

## 2017-10-11 NOTE — Progress Notes (Signed)
OT Cancellation Note  Patient Details Name: Charles GeraldsJohn P Ewy MRN: [id number] DOB: 05/08/1966   Cancelled Treatment:    Reason Eval/Treat Not Completed: Fatigue/lethargy limiting ability to participate.  Attempted x 2.  Pt fatigued this am and asking to sleep a bit longer as he didn't sleep last pm.  Second attempt, pt just back to bed and fatigued after PT.  Will reattempt  Reynolds AmericanWendi Junious Ragone, OTR/L [phone]   Jeani HawkingConarpe, Ayla Dunigan M 10/11/2017, 2:52 PM

## 2017-10-12 MED ORDER — OXYCODONE-ACETAMINOPHEN 10-325 MG PO TABS: 1.0000 | ORAL_TABLET | ORAL | 0 refills | Status: DC | PRN

## 2017-10-12 MED ORDER — DIAZEPAM 5 MG PO TABS: 5.0000 mg | ORAL_TABLET | Freq: Every day | ORAL | 0 refills | Status: DC
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
## 2017-10-12 NOTE — Discharge Instructions (Signed)

## 2017-10-24 DIAGNOSIS — S22080A Wedge compression fracture of T11-T12 vertebra, initial encounter for closed fracture: Secondary | ICD-10-CM | POA: Diagnosis not present

## 2017-10-26 ENCOUNTER — Encounter (HOSPITAL_COMMUNITY): Payer: Self-pay

## 2017-10-26 ENCOUNTER — Other Ambulatory Visit: Payer: Self-pay

## 2017-10-26 ENCOUNTER — Emergency Department (HOSPITAL_COMMUNITY): Payer: BLUE CROSS/BLUE SHIELD

## 2017-10-26 ENCOUNTER — Emergency Department (HOSPITAL_COMMUNITY)
Admission: EM | Admit: 2017-10-26 | Discharge: 2017-10-26 | Disposition: A | Discharge status: Home / Self Care | Payer: BLUE CROSS/BLUE SHIELD | Attending: Emergency Medicine | Admitting: Emergency Medicine

## 2017-10-26 DIAGNOSIS — I10 Essential (primary) hypertension: Secondary | ICD-10-CM | POA: Diagnosis not present

## 2017-10-26 DIAGNOSIS — R270 Ataxia, unspecified: Secondary | ICD-10-CM | POA: Insufficient documentation

## 2017-10-26 DIAGNOSIS — Z7982 Long term (current) use of aspirin: Secondary | ICD-10-CM | POA: Diagnosis not present

## 2017-10-26 DIAGNOSIS — E785 Hyperlipidemia, unspecified: Secondary | ICD-10-CM | POA: Diagnosis not present

## 2017-10-26 DIAGNOSIS — Z79899 Other long term (current) drug therapy: Secondary | ICD-10-CM | POA: Diagnosis not present

## 2017-10-26 DIAGNOSIS — I69393 Ataxia following cerebral infarction: Secondary | ICD-10-CM | POA: Diagnosis not present

## 2017-10-26 DIAGNOSIS — R4781 Slurred speech: Secondary | ICD-10-CM | POA: Diagnosis not present

## 2017-10-26 DIAGNOSIS — R531 Weakness: Secondary | ICD-10-CM | POA: Diagnosis not present

## 2017-10-26 DIAGNOSIS — R4701 Aphasia: Secondary | ICD-10-CM | POA: Diagnosis present

## 2017-10-26 LAB — COMPREHENSIVE METABOLIC PANEL
ALBUMIN: 3.6 g/dL (ref 3.5–5.0)
ALT: 16 U/L — AB (ref 17–63)
AST: 20 U/L (ref 15–41)
Alkaline Phosphatase: 133 U/L — ABNORMAL HIGH (ref 38–126)
Anion gap: 8 (ref 5–15)
BILIRUBIN TOTAL: 0.5 mg/dL (ref 0.3–1.2)
BUN: 14 mg/dL (ref 6–20)
CHLORIDE: 103 mmol/L (ref 101–111)
CO2: 24 mmol/L (ref 22–32)
CREATININE: 1.68 mg/dL — AB (ref 0.61–1.24)
Calcium: 8.7 mg/dL — ABNORMAL LOW (ref 8.9–10.3)
GFR calc Af Amer: 52 mL/min — ABNORMAL LOW (ref >60)
GFR, EST NON AFRICAN AMERICAN: 45 mL/min — AB (ref >60)
GLUCOSE: 96 mg/dL (ref 65–99)
POTASSIUM: 4 mmol/L (ref 3.5–5.1)
Sodium: 135 mmol/L (ref 135–145)
Total Protein: 6.8 g/dL (ref 6.5–8.1)

## 2017-10-26 LAB — I-STAT TROPONIN, ED: TROPONIN I, POC: 0 ng/mL (ref 0.00–0.08)

## 2017-10-26 LAB — CBC
HEMATOCRIT: 33.9 % — AB (ref 39.0–52.0)
HEMOGLOBIN: 10.7 g/dL — AB (ref 13.0–17.0)
MCH: 31.8 pg (ref 26.0–34.0)
MCHC: 31.6 g/dL (ref 30.0–36.0)
MCV: 100.9 fL — ABNORMAL HIGH (ref 78.0–100.0)
Platelets: 411 10*3/uL — ABNORMAL HIGH (ref 150–400)
RBC: 3.36 MIL/uL — ABNORMAL LOW (ref 4.22–5.81)
RDW: 12.6 % (ref 11.5–15.5)
WBC: 6.1 10*3/uL (ref 4.0–10.5)

## 2017-10-26 LAB — DIFFERENTIAL
BASOS ABS: 0.1 10*3/uL (ref 0.0–0.1)
BASOS PCT: 1 %
EOS ABS: 0.6 10*3/uL (ref 0.0–0.7)
Eosinophils Relative: 10 %
LYMPHS ABS: 2.1 10*3/uL (ref 0.7–4.0)
Lymphocytes Relative: 35 %
MONOS PCT: 9 %
Monocytes Absolute: 0.6 10*3/uL (ref 0.1–1.0)
NEUTROS ABS: 2.7 10*3/uL (ref 1.7–7.7)
NEUTROS PCT: 45 %

## 2017-10-26 LAB — PROTIME-INR
INR: 1.11
Prothrombin Time: 14.2 seconds (ref 11.4–15.2)

## 2017-10-26 LAB — I-STAT CHEM 8, ED
BUN: 16 mg/dL (ref 6–20)
CREATININE: 1.8 mg/dL — AB (ref 0.61–1.24)
Calcium, Ion: 1.16 mmol/L (ref 1.15–1.40)
Chloride: 102 mmol/L (ref 101–111)
GLUCOSE: 92 mg/dL (ref 65–99)
HEMATOCRIT: 33 % — AB (ref 39.0–52.0)
Hemoglobin: 11.2 g/dL — ABNORMAL LOW (ref 13.0–17.0)
POTASSIUM: 4 mmol/L (ref 3.5–5.1)
Sodium: 137 mmol/L (ref 135–145)
TCO2: 25 mmol/L (ref 22–32)

## 2017-10-26 LAB — APTT: APTT: 36 s (ref 24–36)

## 2017-10-26 MED ORDER — TRAMADOL HCL 50 MG PO TABS: 100.0000 mg | ORAL_TABLET | Freq: Four times a day (QID) | ORAL | 0 refills | Status: DC | PRN

## 2017-10-26 MED ORDER — TRAMADOL HCL 50 MG PO TABS
100.0000 mg | ORAL_TABLET | Freq: Once | ORAL | Status: AC
  Administered 2017-10-26: 100 mg via ORAL
  Filled 2017-10-26: qty 2

## 2017-10-26 NOTE — Discharge Instructions (Signed)
Your evaluated in the emergency department for a new onset slurred speech, unsteady gait, slowed thinking.  This is most likely related to the new medications that you are taking for your back surgery.  There is no evidence of a stroke by CT although this test is not definitive.  You are going to be stopping the narcotic pain medicine and the muscle relaxant.  We are prescribing you tramadol to help with the pain.  You should return to the emergency department if you have any worsening of her symptoms otherwise follow-up with your back doctor and your primary care doctor.

## 2017-10-26 NOTE — ED Triage Notes (Signed)
Pt presents to the ed with complaints of slurred speech and unsteady gait x 3 days. Family reports confusion x 3 days. Pt is alert and oriented in triage. No focal neuro deficits in triage.

## 2017-10-26 NOTE — ED Provider Notes (Signed)
[facility] EMERGENCY DEPARTMENT Provider Note   CSN: [id number] Arrival date & time: 10/26/17  1722     History   Chief Complaint Chief Complaint  Patient presents with  . Aphasia  . Gait Problem    HPI Charles Villarreal is a 52 y.o. male.  52 year old male with history of sarcoid who recently had a T11 fusion by Dr. Dutch Quint.  He has been on oxycodone and Valium and on Wednesday he was switched to [facility] and off the Valium.  Since then he has been more somnolent and confused slow to answer and unsteady on his feet.  He is required redirection by his wife.  They called the office today and referred to the ED for evaluation.  He last took the Soma at 11 AM.  He states he was not having these symptoms when he was on the valium.   The history is provided by the patient and the spouse.  Cerebrovascular Accident  This is a new problem. The current episode started more than 2 days ago. The problem occurs constantly. The problem has not changed since onset.Pertinent negatives include no chest pain, no abdominal pain, no headaches and no shortness of breath. Nothing aggravates the symptoms. Nothing relieves the symptoms. He has tried nothing for the symptoms. The treatment provided no relief.    Past Medical History:  Diagnosis Date  . Complication of anesthesia    aggression  . Depression   . GERD (gastroesophageal reflux disease)   . Gout   . HSV-1 (herpes simplex virus 1) infection   . Hyperlipidemia   . Hypertension   . Low back pain   . Sarcoidosis   . Tremor     Patient Active Problem List   Diagnosis Date Noted  . T12 burst fracture (HCC) 10/09/2017  . Gout 11/29/2015  . Left lateral ankle pain 03/16/2015  . Acute upper respiratory infections of unspecified site 08/30/2013  . Hyperlipidemia 11/10/2012  . Erectile dysfunction 06/07/2012  . Benign positional vertigo 06/07/2012  . Essential tremor 06/07/2012  . Pre-syncope 05/18/2012  . Chronic rhinitis  10/08/2011  . HYPERTENSION, BENIGN 02/22/2010  . GERD 02/22/2010  . PULMONARY SARCOIDOSIS 10/30/2007  . INSOMNIA 10/30/2007    Past Surgical History:  Procedure Laterality Date  . ANTERIOR LAT LUMBAR FUSION N/A 10/09/2017   Procedure: Thoracic twelve Corpectomy, Thoracic eleven-Lumbar one  lateral plate,   Anterior Lateral Interbody Fusion with Corpectomy Cage ;  Surgeon: Julio Sicks, MD;  Location: [facility] OR;  Service: Neurosurgery;  Laterality: N/A;  . APPENDECTOMY    . KNEE ARTHROPLASTY  1995   Lt  . UMBILICAL HERNIA REPAIR         Home Medications    Prior to Admission medications   Medication Sig Start Date End Date Taking? Authorizing Provider  amLODipine (NORVASC) 5 MG tablet TAKE ONE TABLET EACH DAY Patient not taking: Reported on 09/27/2017 03/21/16   Margaree Mackintosh, MD  Ascorbic Acid (VITAMIN C) 1000 MG tablet Take 1,000 mg by mouth daily.    [provider]  aspirin EC 81 MG tablet Take 81 mg by mouth daily.    [provider]  atorvastatin (LIPITOR) 40 MG tablet Take 1 tablet (40 mg total) by mouth daily. Patient not taking: Reported on 09/27/2017 03/21/16   Margaree Mackintosh, MD  diazepam (VALIUM) 5 MG tablet Take 1 tablet (5 mg total) by mouth at bedtime. 10/12/17   Julio Sicks, MD  losartan (COZAAR) 100 MG tablet  TAKE ONE TABLET EACH DAY Patient not taking: Reported on 09/27/2017 03/06/17   Margaree Mackintosh, MD  Melatonin 5 MG TABS Take 5 mg by mouth at bedtime.    [provider]  OVER THE COUNTER MEDICATION Take 1 Scoop by mouth daily. SuperBeets otc powder mixed with liquid    [provider]  oxyCODONE-acetaminophen (PERCOCET) 10-325 MG tablet Take 1-2 tablets by mouth every 4 (four) hours as needed for pain. 10/12/17   Julio Sicks, MD  oxymetazoline (AFRIN) 0.05 % nasal spray Place 1 spray into both nostrils daily as needed for congestion.    [provider]  primidone (MYSOLINE) 50 MG tablet Take 1 tablet (50 mg total) by mouth at  bedtime. 05/03/17   Tat, Octaviano Batty, DO  propranolol ER (INDERAL LA) 120 MG 24 hr capsule Take 1 capsule (120 mg total) by mouth daily. 05/03/17   Tat, Octaviano Batty, DO  ranitidine (ZANTAC) 150 MG capsule Take 150 mg by mouth at bedtime.      [provider]    Family History Family History  Problem Relation Age of Onset  . Hypertension Mother     Social History Social History   Tobacco Use  . Smoking status: Never Smoker  . Smokeless tobacco: Former Neurosurgeon    Types: Chew  Substance Use Topics  . Alcohol use: Yes    Comment: only occ  . Drug use: No     Allergies   Patient has no known allergies.   Review of Systems Review of Systems  Constitutional: Negative for chills and fever.  HENT: Negative for ear pain and sore throat.   Eyes: Negative for pain and visual disturbance.  Respiratory: Negative for cough and shortness of breath.   Cardiovascular: Negative for chest pain and palpitations.  Gastrointestinal: Negative for abdominal pain and vomiting.  Genitourinary: Negative for dysuria and hematuria.  Musculoskeletal: Negative for arthralgias and back pain.  Skin: Negative for color change and rash.  Neurological: Positive for tremors, speech difficulty and weakness. Negative for seizures, syncope and headaches.  All other systems reviewed and are negative.    Physical Exam Updated Vital Signs BP 110/75   Pulse (!) 54   Temp 97.6 F (36.4 C) (Oral)   Resp 15   Wt 98 kg (216 lb)   SpO2 100%   BMI 29.29 kg/m   Physical Exam  Constitutional: He appears well-developed and well-nourished.  HENT:  Head: Normocephalic and atraumatic.  Eyes: Conjunctivae are normal.  Neck: Neck supple.  Cardiovascular: Normal rate and regular rhythm.  No murmur heard. Pulmonary/Chest: Effort normal and breath sounds normal. No respiratory distress.  Abdominal: Soft. There is no tenderness.  Musculoskeletal: He exhibits no edema.  Neurological: He is alert. He has normal  strength. No cranial nerve deficit or sensory deficit. He exhibits normal muscle tone. Coordination normal. GCS eye subscore is 4. GCS verbal subscore is 5. GCS motor subscore is 6.  He does seem slightly medicated and slowed questions but his thought content is clear. His cerebellar testing with finger to nose and heel to shin is normal.  Skin: Skin is warm and dry. Capillary refill takes less than 2 seconds.  Psychiatric: He has a normal mood and affect.  Nursing note and vitals reviewed.    ED Treatments / Results  Labs (all labs ordered are listed, but only abnormal results are displayed) Labs Reviewed  CBC - Abnormal; Notable for the following components:      Result Value  RBC 3.36 (*)    Hemoglobin 10.7 (*)    HCT 33.9 (*)    MCV 100.9 (*)    Platelets 411 (*)    All other components within normal limits  COMPREHENSIVE METABOLIC PANEL - Abnormal; Notable for the following components:   Creatinine, Ser 1.68 (*)    Calcium 8.7 (*)    ALT 16 (*)    Alkaline Phosphatase 133 (*)    GFR calc non Af Amer 45 (*)    GFR calc Af Amer 52 (*)    All other components within normal limits  I-STAT CHEM 8, ED - Abnormal; Notable for the following components:   Creatinine, Ser 1.80 (*)    Hemoglobin 11.2 (*)    HCT 33.0 (*)    All other components within normal limits  PROTIME-INR  APTT  DIFFERENTIAL  I-STAT TROPONIN, ED  CBG MONITORING, ED    EKG  EKG Interpretation None       Radiology Ct Head Wo Contrast  Result Date: 10/26/2017 CLINICAL DATA:  52 year old male with slurred speech and abnormal gait for 3 days. EXAM: CT HEAD WITHOUT CONTRAST TECHNIQUE: Contiguous axial images were obtained from the base of the skull through the vertex without intravenous contrast. COMPARISON:  Head CT without contrast 09/25/2017. Brain MRI 08/12/2012 FINDINGS: Brain: Cavum septum pellucidum, normal variant. No midline shift, ventriculomegaly, mass effect, evidence of mass lesion,  intracranial hemorrhage or evidence of cortically based acute infarction. Gray-white matter differentiation is within normal limits throughout the brain. No encephalomalacia identified. Vascular: Calcified atherosclerosis at the skull base. Chronically tortuous intracranial arteries. Skull: Stable and negative. Sinuses/Orbits: Paranasal sinuses and mastoids are stable and well pneumatized. Other: Visualized orbits and scalp soft tissues are within normal limits. IMPRESSION: Stable and negative noncontrast head CT. Electronically Signed   By: Odessa Fleming M.D.   On: 10/26/2017 18:05    Procedures Procedures (including critical care time)  Medications Ordered in ED Medications - No data to display   Initial Impression / Assessment and Plan / ED Course  I have reviewed the triage vital signs and the nursing notes.  Pertinent labs & imaging results that were available during my care of the patient were reviewed by me and considered in my medical decision making (see chart for details).  Clinical Course as of Oct 27 1041  Fri Oct 26, 2017  2122 discussed with Dr. Otelia Limes from neurology.  He is in agreement that the most likely cause of all of this is going to be medication related as that was the newest thing that was temporally related to his symptoms.  He felt the patient could go home with a trial getting him off the [facility] and even possibly the narcotic and seeing if his symptoms improved.  If it did not then he would recommend the patient return to the ED for further evaluation.  [MB]  2123 Patient and his wife are comfortable with the plan.  He is going to stop his oxycodone and his Tresa Garter and he will start on some tramadol that he had been on prior with no side effects.  And get a given the first dose of tramadol here and a prescription to go home with.  They understand to return if any worsening symptoms.  [MB]    Clinical Course User Index [MB] Terrilee Files, MD     Final Clinical  Impressions(s) / ED Diagnoses   Final diagnoses:  Ataxia  Slurred speech    ED Discharge Orders  None       Terrilee FilesButler, Michael C, MD 10/27/17 1043

## 2017-10-30 NOTE — Progress Notes (Signed)
Subjective:    Charles Villarreal was seen in consultation in the movement disorder clinic at the request of Dr. Eden Villarreal.  His PCP is [name], [name], MD.  The evaluation is for tremor.  He was previously seen at [facility] for the same.  I reviewed those records via Care Everywhere.   The pt states that the first sx started about 2 years ago and it was an acute episode of loss of balance and dizziness.  He was [place] [employer] [place]  He was dizzy and off balance for about 2 hours.  Admittedly, it was very hot outside.  Not long thereafter, he would note intermittent trouble writing because of tremor.  He would note tremor with other activites as well, such as with drinking out of a cup or using the arm.  This has gotten better with diet and lifestyle modifications (less caffeine, less alcohol, more exercise, adding propranolol).  The tremor can be R or L and is independent of activity.  He was started on propranolol by Dr. Lenord Villarreal for the tremor and BP.  [facility] and had wore a BP monitor.  His father and grandfather had hx of tremor.  He does not know if his grandfather ever got a dx and his father is still living, but is undiagnosed as far as he knows with any particular tremor type.  He was on prednisone around Christmas for flu (has hx of pulmonary sarcoid) and that didn't seem to make tremor worse.   Affected by caffeine:  yes, but decreased caffeine to one diet coke per day and one cup per day Affected by alcohol:  yes Affected by stress:  yes Affected by fatigue:  no (does seem worse around 1:30-2 pm) Spills soup if on spoon:  yes (intermittent) Spills glass of liquid if full:  no Affects ADL's (tying shoes, brushing teeth, etc):  no  03/05/14 update:  The patient was seen back in followup today.  He has had several tests in regards to his tremor, but also in regards to the fact that he had some concern in features on his physical exam or worrisome for a possible parkinsonian states,  including loss of balance, dizziness and elevated tone on the right.  Fortunately, his DaT scan done since last visit was unremarkable.  Unfortunately, his insurance does not pay for the scan.  I have written an appeal letter as this was completely within indications.  He did have other tests done since last visit.  His 24-hour urine for metanephrine test negative.  His 2 hour glucose tolerance test was unremarkable.  B12 was mildly low at 381.  He had plasma catecholamines drawn.  Norepinephrine was normal, as is dopamine.  Epinephrine was slightly elevated, but I think that this was just incidental.  Pt reports that clinically he is feeling much better.  Dizziness is virtually gone.  He only has this 1-2 times per month and it was daily.  He is also exercising every single day in the morning now, which has helped.  He thinks that the vitamin B12 and vitamin C have given him some energy, along with the exercises.  While his blood pressure was somewhat elevated today (states that he was almost late to get here), his blood pressure has [name] control.  He has gotten a fit bit and has become somewhat competitive with his family regarding this.  This has turned out to be a positive thing.  02/24/15 update:  The  patient returns today for follow-up.  Overall, the patient states that he has done well over the year, until very recently.  He ran out of the medication and now has noticed an increase in tremor.  Once he made a follow-up here, he was given a refill on the medication and restarted it on Monday so he has only been back on it for 2 days.  His tremor is improving but he still is noticing more than he originally was.  Otherwise, the patient states that he is doing well and has no new medical problems.  He does state that he is exercising more.  04/20/16 update:  The patient is following up today.  I have not seen him in over a year.  He called me not long after our last visit to state that his tremor is  increasing and we increase his Inderal LA total of 120 mg daily.  He states that tremor was well controlled until he ran out of it 4 days ago.  He is therefore tremulous today.    10/31/16 update:  Pt f/u today, earlier than expected.  Wife accompanies him and supplements the history.    On Inderal LA 120 mg daily.    States that he no longer really has periods of normalcy and has more times of tremor.  Hurt his back and can't exercise as much and thinks that this contributes but isn't only factor.  Hands and legs.  More with use of hands.  Trouble staying asleep as well; thinks that tremor awakens him.  No new medications.  Was able to back off on chol med because diet is better.  Feels that balance has not been as good.  No falls.  12/26/16 update:  Pt seen today in f/u for tremor.  Patient is currently on primidone, 50 mg daily which was added last visit to his Inderal LA, 120 mg daily.  States that primidone did help but it caused sleep trouble.  Pt states that he went fishing this weekend and he hurt his back and went to a PA this weekend and got some prednisone yesterday.  This hasn't worsened his tremor yet.  On flexeril right now for his back.    05/03/17 update:  Patient seen today in follow-up for essential tremor.  Patient is on Inderal LA, 120 mg daily.  He is also on primidone, 50 mg daily.  The patient reports that "its so much better than it was but there is a little tremor lately."  He is able to write for himself lately.  The addition of primidone really helped.     Last time, he told me he had injured his back while fishing on the boat.  Turns out that he sustained a fracture of the back.  He had seen Dr. Dutch Quint.  It is slowly getting better.  He only uses ultram when he exercises.    09/25/17 update: Patient is seen today in follow-up for essential tremor.  He is seen earlier than scheduled. This patient is accompanied in the office by his wife who supplements the history.   He is on Inderal LA,  120 mg daily.  He is also on primidone, 50 mg daily.  The patient states that on Sunday he woke up and had severe back pain (not unusual) but when he went to stand up he was very off balance and somewhat dizzy.  Looking back, he didn't go to work the previous thurs/friday due to lack of balance.  When asked, he stated that the lack of balance was much worse than dizziness and he does not describe any vertigo.  He also noted some tremor in the legs.  He started on valium for his back in the beginning of Jan and he didn't note that it was affecting balance but wife noted that it was affecting him cognitively.  Pt does think that it was causing a hang over effect.  His balance is slowing getting better since Sunday.  He does feel a little weak in proximal arms and legs and they feel sore, as if he was working out.  He has been on lipitor for years.  He has no lateralizing weakness/paresthesias.  Has MRI on back later today at neurosx office.  Also had emesis x 2 days at dinner time.  On tramadol but doesn't think that emesis associated with that.    11/02/17 update: Patient is seen today in follow-up. He is accompanied by his wife who supplements the history.   Patient had a T12 corpectomy with T11-L1 anterior lateral fusion.  This was completed on October 09, 2017.  He was discharged October 12, 2017 but returned to the emergency room on October 26, 2017.  [facility] and Valium and became more somnolent and confused.  CT of the head was completed and was negative.  This was felt likely due to medication effect.  He is only on tramadol now. He reports pain is better than before surgery but he still is in pain.   He has a f/u on 3/27.  Only walking for exercise.  In regards to tremor, the patient remains on Inderal LA, 120 mg daily and primidone 50 mg daily.  Tremor has been stable and even some better.  He is off of several BP meds.  States that he used to drink and no longer drinks  EtOH.  He also lost weight and feels that this has helped BP.     Outside reports reviewed: historical medical records.  No Known Allergies  Current Outpatient Medications on File Prior to Visit  Medication Sig Dispense Refill  . Ascorbic Acid (VITAMIN C) 1000 MG tablet Take 1,000 mg by mouth daily.    Marland Kitchen. aspirin EC 81 MG tablet Take 81 mg by mouth daily.    . Melatonin 5 MG TABS Take 5 mg by mouth at bedtime.    . primidone (MYSOLINE) 50 MG tablet Take 1 tablet (50 mg total) by mouth at bedtime. 90 tablet 2  . propranolol ER (INDERAL LA) 120 MG 24 hr capsule Take 1 capsule (120 mg total) by mouth daily. 90 capsule 3  . ranitidine (ZANTAC) 150 MG capsule Take 150 mg by mouth at bedtime.      . traMADol (ULTRAM) 50 MG tablet Take 2 tablets (100 mg total) by mouth every 6 (six) hours as needed. 30 tablet 0   No current facility-administered medications on file prior to visit.     Past Medical History:  Diagnosis Date  . Complication of anesthesia    aggression  . Depression   . GERD (gastroesophageal reflux disease)   . Gout   . HSV-1 (herpes simplex virus 1) infection   . Hyperlipidemia   . Hypertension   . Low back pain   . Sarcoidosis   . Tremor     Past Surgical History:  Procedure Laterality Date  . ANTERIOR LAT LUMBAR FUSION N/A 10/09/2017   Procedure: Thoracic twelve Corpectomy, Thoracic eleven-Lumbar  one  lateral plate,   Anterior Lateral Interbody Fusion with Corpectomy Cage ;  Surgeon: Julio Sicks, MD;  Location: [facility] OR;  Service: Neurosurgery;  Laterality: N/A;  . APPENDECTOMY    . KNEE ARTHROPLASTY  1995   Lt  . UMBILICAL HERNIA REPAIR      Social History   Socioeconomic History  . Marital status: Married    Spouse name: Not on file  . Number of children: Not on file  . Years of education: Not on file  . Highest education level: Not on file  Social Needs  . Financial resource strain: Not on file  . Food insecurity - worry: Not on file  . Food insecurity  - inability: Not on file  . Transportation needs - medical: Not on file  . Transportation needs - non-medical: Not on file  Occupational History  . Not on file  Tobacco Use  . Smoking status: Never Smoker  . Smokeless tobacco: Former Neurosurgeon    Types: Chew  Substance and Sexual Activity  . Alcohol use: Yes    Comment: only occ  . Drug use: No  . Sexual activity: Not on file  Other Topics Concern  . Not on file  Social History Narrative  . Not on file    Family Status  Relation Name Status  . Father  Alive  . Sister  Alive  . Mother  Alive     Review of Systems A complete 10 system ROS was obtained and was negative apart from what is mentioned.   Objective:   VITALS:   Vitals:   11/02/17 0814  BP: 112/70  Pulse: 72  SpO2: 97%  Weight: 208 lb (94.3 kg)  Height: 6' (1.829 m)   Wt Readings from Last 3 Encounters:  11/02/17 208 lb (94.3 kg)  10/26/17 216 lb (98 kg)  10/09/17 216 lb (98 kg)   Gen:  Appears stated age and in NAD. HEENT:  Normocephalic, atraumatic. The mucous membranes are moist. The superficial temporal arteries are without ropiness or tenderness. Cardiovascular: Regular rate and rhythm. Lungs: Clear to auscultation bilaterally. Neck: There are no carotid bruits noted bilaterally.  NEUROLOGICAL:  Orientation:  The patient is alert and oriented x 3.   Cranial nerves: There is good facial symmetry.  Soft palate rises symmetrically and there is no tongue deviation. Hearing is intact to conversational tone. Tone: Tone is good throughout. Sensation: Sensation is intact to light touch touch throughout Coordination:  The patient has no difficulty with RAM's or FNF bilaterally. Motor: Strength is 5/5 in the bilateral upper and lower extremities.  Shoulder shrug is equal bilaterally.  There is no pronator drift.  There are no fasciculations noted. Gait and Station: The patient arises out of the chair.  He is no longer antalgic.      MOVEMENT  EXAM: Tremor:  There is minimal postural tremor.  There is mild tremor with intention on the L.  Only spills a little water when asked to pour one glass to another  LABS  Lab Results  Component Value Date   HGBA1C 5.4 08/05/2015   Lab Results  Component Value Date   VITAMINB12 561 09/25/2017   Lab Results  Component Value Date   TSH 2.21 09/25/2017     Chemistry      Component Value Date/Time   NA 137 10/26/2017 1759   K 4.0 10/26/2017 1759   CL 102 10/26/2017 1759   CO2 24 10/26/2017 1736   BUN 16 10/26/2017  1759   CREATININE 1.80 (H) 10/26/2017 1759   CREATININE 0.97 09/25/2017 0951      Component Value Date/Time   CALCIUM 8.7 (L) 10/26/2017 1736   ALKPHOS 133 (H) 10/26/2017 1736   AST 20 10/26/2017 1736   ALT 16 (L) 10/26/2017 1736   BILITOT 0.5 10/26/2017 1736         Assessment/Plan:   1.  Essential Tremor.  -His DAT scan was negative years ago.  Tremor is well controlled  -continue Inderal LA 120 mg.   - primidone, 50 mg daily.  Doing well with this medication and has helped a lot.  Will continue. 2.  Low back pain  -recovering from back surgery.  Still in some pain.  Encouraged him to be patient as recovery can be slow. 3.  Follow up is anticipated in the next few months, sooner should new neurologic issues arise.  Much greater than 50% of this visit was spent in counseling and coordinating care.  Total face to face time:  15 min

## 2017-11-02 ENCOUNTER — Encounter: Payer: Self-pay | Admitting: Neurology

## 2017-11-02 ENCOUNTER — Ambulatory Visit (INDEPENDENT_AMBULATORY_CARE_PROVIDER_SITE_OTHER): Payer: BLUE CROSS/BLUE SHIELD | Admitting: Neurology

## 2017-11-02 VITALS — BP 112/70 | HR 72 | Ht 72.0 in | Wt 208.0 lb

## 2017-11-02 DIAGNOSIS — M545 Low back pain: Secondary | ICD-10-CM | POA: Diagnosis not present

## 2017-11-02 DIAGNOSIS — G250 Essential tremor: Secondary | ICD-10-CM | POA: Diagnosis not present

## 2017-11-02 MED ORDER — PROPRANOLOL HCL ER 120 MG PO CP24: 120.0000 mg | ORAL_CAPSULE | Freq: Every day | ORAL | 3 refills | Status: DC

## 2017-11-02 MED ORDER — PRIMIDONE 50 MG PO TABS: 50.0000 mg | ORAL_TABLET | Freq: Every day | ORAL | 2 refills | Status: DC

## 2017-11-21 DIAGNOSIS — S22080A Wedge compression fracture of T11-T12 vertebra, initial encounter for closed fracture: Secondary | ICD-10-CM | POA: Diagnosis not present

## 2018-01-03 DIAGNOSIS — S22080A Wedge compression fracture of T11-T12 vertebra, initial encounter for closed fracture: Secondary | ICD-10-CM | POA: Diagnosis not present

## 2018-01-31 DIAGNOSIS — S22080A Wedge compression fracture of T11-T12 vertebra, initial encounter for closed fracture: Secondary | ICD-10-CM | POA: Diagnosis not present

## 2018-01-31 DIAGNOSIS — Z6828 Body mass index (BMI) 28.0-28.9, adult: Secondary | ICD-10-CM | POA: Diagnosis not present

## 2018-01-31 DIAGNOSIS — I10 Essential (primary) hypertension: Secondary | ICD-10-CM | POA: Diagnosis not present

## 2018-01-31 DIAGNOSIS — R030 Elevated blood-pressure reading, without diagnosis of hypertension: Secondary | ICD-10-CM | POA: Diagnosis not present

## 2018-02-02 ENCOUNTER — Emergency Department (HOSPITAL_COMMUNITY)
Admission: EM | Admit: 2018-02-02 | Discharge: 2018-02-03 | Disposition: A | Discharge status: Home / Self Care | Payer: BLUE CROSS/BLUE SHIELD | Attending: Emergency Medicine | Admitting: Emergency Medicine

## 2018-02-02 ENCOUNTER — Encounter (HOSPITAL_COMMUNITY): Payer: Self-pay | Admitting: Emergency Medicine

## 2018-02-02 ENCOUNTER — Other Ambulatory Visit: Payer: Self-pay

## 2018-02-02 DIAGNOSIS — T50901A Poisoning by unspecified drugs, medicaments and biological substances, accidental (unintentional), initial encounter: Secondary | ICD-10-CM | POA: Insufficient documentation

## 2018-02-02 DIAGNOSIS — R609 Edema, unspecified: Secondary | ICD-10-CM | POA: Diagnosis not present

## 2018-02-02 DIAGNOSIS — I10 Essential (primary) hypertension: Secondary | ICD-10-CM | POA: Diagnosis not present

## 2018-02-02 DIAGNOSIS — Z7982 Long term (current) use of aspirin: Secondary | ICD-10-CM | POA: Insufficient documentation

## 2018-02-02 DIAGNOSIS — F10929 Alcohol use, unspecified with intoxication, unspecified: Secondary | ICD-10-CM | POA: Diagnosis not present

## 2018-02-02 DIAGNOSIS — R4182 Altered mental status, unspecified: Secondary | ICD-10-CM | POA: Diagnosis not present

## 2018-02-02 DIAGNOSIS — Z79899 Other long term (current) drug therapy: Secondary | ICD-10-CM | POA: Diagnosis not present

## 2018-02-02 DIAGNOSIS — R000 Tachycardia, unspecified: Secondary | ICD-10-CM | POA: Diagnosis not present

## 2018-02-02 DIAGNOSIS — R0902 Hypoxemia: Secondary | ICD-10-CM | POA: Diagnosis not present

## 2018-02-02 DIAGNOSIS — Z87891 Personal history of nicotine dependence: Secondary | ICD-10-CM | POA: Insufficient documentation

## 2018-02-02 DIAGNOSIS — R402441 Other coma, without documented Glasgow coma scale score, or with partial score reported, in the field [EMT or ambulance]: Secondary | ICD-10-CM | POA: Diagnosis not present

## 2018-02-02 NOTE — ED Notes (Signed)
Bed: RESA Expected date: 02/02/18 Expected time:  Means of arrival:  Comments: Overdose

## 2018-02-02 NOTE — ED Notes (Signed)
Talked with poison control states watch pt for 6 hours EKG alcohol tylenol all SI labs IV fluids for tachycardia.

## 2018-02-02 NOTE — ED Triage Notes (Signed)
Patient was believed to have taken an extra 1450 mg of tramadol and 135 mg of diazepam at home around 2140. While patient denies alcohol use however the family states that is not true. It was report this was the second incident since February post back surgery. Patient denies pain and suicide ideations.

## 2018-02-03 LAB — RAPID URINE DRUG SCREEN, HOSP PERFORMED
AMPHETAMINES: NOT DETECTED
Barbiturates: NOT DETECTED
Benzodiazepines: POSITIVE — AB
Cocaine: NOT DETECTED
OPIATES: NOT DETECTED
Tetrahydrocannabinol: NOT DETECTED

## 2018-02-03 LAB — COMPREHENSIVE METABOLIC PANEL
ALBUMIN: 4.1 g/dL (ref 3.5–5.0)
ALK PHOS: 94 U/L (ref 38–126)
ALT: 25 U/L (ref 17–63)
AST: 33 U/L (ref 15–41)
Anion gap: 12 (ref 5–15)
BILIRUBIN TOTAL: 0.4 mg/dL (ref 0.3–1.2)
BUN: 14 mg/dL (ref 6–20)
CALCIUM: 8.9 mg/dL (ref 8.9–10.3)
CO2: 20 mmol/L — ABNORMAL LOW (ref 22–32)
Chloride: 106 mmol/L (ref 101–111)
Creatinine, Ser: 0.83 mg/dL (ref 0.61–1.24)
GFR calc Af Amer: >60 mL/min (ref >60)
GFR calc non Af Amer: >60 mL/min (ref >60)
GLUCOSE: 116 mg/dL — AB (ref 65–99)
Potassium: 4.3 mmol/L (ref 3.5–5.1)
Sodium: 138 mmol/L (ref 135–145)
TOTAL PROTEIN: 7.1 g/dL (ref 6.5–8.1)

## 2018-02-03 LAB — CBC
HEMATOCRIT: 46.5 % (ref 39.0–52.0)
Hemoglobin: 15.6 g/dL (ref 13.0–17.0)
MCH: 32.6 pg (ref 26.0–34.0)
MCHC: 33.5 g/dL (ref 30.0–36.0)
MCV: 97.1 fL (ref 78.0–100.0)
Platelets: 87 10*3/uL — ABNORMAL LOW (ref 150–400)
RBC: 4.79 MIL/uL (ref 4.22–5.81)
RDW: 17.1 % — ABNORMAL HIGH (ref 11.5–15.5)
WBC: 6.8 10*3/uL (ref 4.0–10.5)

## 2018-02-03 LAB — ETHANOL: Alcohol, Ethyl (B): 351 mg/dL (ref <10)

## 2018-02-03 LAB — ACETAMINOPHEN LEVEL: Acetaminophen (Tylenol), Serum: <10 ug/mL — ABNORMAL LOW (ref 10–30)

## 2018-02-03 LAB — PLATELET COUNT: Platelets: 278 10*3/uL (ref 150–400)

## 2018-02-03 LAB — SALICYLATE LEVEL: Salicylate Lvl: <7 mg/dL (ref 2.8–30.0)

## 2018-02-03 MED ORDER — SODIUM CHLORIDE 0.9 % IV BOLUS
1000.0000 mL | Freq: Once | INTRAVENOUS | Status: AC
  Administered 2018-02-03: 1000 mL via INTRAVENOUS

## 2018-02-03 NOTE — ED Notes (Signed)
Poison Control has closed case on their end.

## 2018-02-03 NOTE — Discharge Instructions (Addendum)
Drink less alcohol and do not mix it with your other medications.

## 2018-02-03 NOTE — ED Provider Notes (Signed)
[facility]-EMERGENCY DEPT Provider Note   CSN: [id number] Arrival date & time: 02/02/18  2317     History   Chief Complaint Chief Complaint  Patient presents with  . Drug Overdose    HPI Aloha GellJohn P Ivor MessierForbis is a 52 y.o. male.  HPI Level 5 caveat due to altered mental status. Brought in for reported overdose.  Patient states he took some extra pills.  Somewhat unclear of what the medicines are per the patient.  States they were his medicines.  States he took them today 5 days ago and may be somewhat in the middle.  States he is had issues since back surgery in February.  Per nursing believed to have taken tramadol and Valium since the pill counts were down on his bottles and the other medicines were correct on the pill counts.  Patient states he took different pills but cannot really tell me what they are.  He is somewhat slow to answer and confused.  Unclear why he took them and if it was a self-harm episode. Past Medical History:  Diagnosis Date  . Complication of anesthesia    aggression  . Depression   . GERD (gastroesophageal reflux disease)   . Gout   . HSV-1 (herpes simplex virus 1) infection   . Hyperlipidemia   . Hypertension   . Low back pain   . Sarcoidosis   . Tremor     Patient Active Problem List   Diagnosis Date Noted  . T12 burst fracture (HCC) 10/09/2017  . Gout 11/29/2015  . Left lateral ankle pain 03/16/2015  . Acute upper respiratory infections of unspecified site 08/30/2013  . Hyperlipidemia 11/10/2012  . Erectile dysfunction 06/07/2012  . Benign positional vertigo 06/07/2012  . Essential tremor 06/07/2012  . Pre-syncope 05/18/2012  . Chronic rhinitis 10/08/2011  . HYPERTENSION, BENIGN 02/22/2010  . GERD 02/22/2010  . PULMONARY SARCOIDOSIS 10/30/2007  . INSOMNIA 10/30/2007    Past Surgical History:  Procedure Laterality Date  . ANTERIOR LAT LUMBAR FUSION N/A 10/09/2017   Procedure: Thoracic twelve Corpectomy, Thoracic  eleven-Lumbar one  lateral plate,   Anterior Lateral Interbody Fusion with Corpectomy Cage ;  Surgeon: Julio SicksPool, Henry, MD;  Location: [facility] OR;  Service: Neurosurgery;  Laterality: N/A;  . APPENDECTOMY    . KNEE ARTHROPLASTY  1995   Lt  . UMBILICAL HERNIA REPAIR          Home Medications    Prior to Admission medications   Medication Sig Start Date End Date Taking? Authorizing Provider  Ascorbic Acid (VITAMIN C) 1000 MG tablet Take 1,000 mg by mouth daily.   Yes [provider]  aspirin EC 81 MG tablet Take 81 mg by mouth daily.   Yes [provider]  BEE POLLEN PO Take 1 Scoop by mouth daily.   Yes [provider]  diazepam (VALIUM) 5 MG tablet Take 5 mg by mouth at bedtime.   Yes [provider]  Melatonin 5 MG TABS Take 5 mg by mouth at bedtime.   Yes [provider]  primidone (MYSOLINE) 50 MG tablet Take 1 tablet (50 mg total) by mouth at bedtime. Patient taking differently: Take 50 mg by mouth daily.  11/02/17  Yes Tat, Octaviano Battyebecca S, DO  propranolol ER (INDERAL LA) 120 MG 24 hr capsule Take 1 capsule (120 mg total) by mouth daily. 11/02/17  Yes Tat, Octaviano Battyebecca S, DO  ranitidine (ZANTAC) 150 MG capsule Take 150 mg by mouth every morning.  Yes [provider]  traMADol (ULTRAM) 50 MG tablet Take 2 tablets (100 mg total) by mouth every 6 (six) hours as needed. Patient taking differently: Take 100 mg by mouth every 6 (six) hours as needed for severe pain.  10/26/17  Yes Terrilee Files, MD    Family History Family History  Problem Relation Age of Onset  . Hypertension Mother     Social History Social History   Tobacco Use  . Smoking status: Never Smoker  . Smokeless tobacco: Former Neurosurgeon    Types: Chew  Substance Use Topics  . Alcohol use: Yes    Comment: only occ  . Drug use: No     Allergies   Patient has no known allergies.   Review of Systems Review of Systems  Unable to perform ROS: Mental status change      Physical Exam Updated Vital Signs BP 118/85   Pulse 88   Temp 98 F (36.7 C)   Resp 16   Ht 6\' 1"  (1.854 m)   Wt 95.3 kg (210 lb)   SpO2 97%   BMI 27.71 kg/m   Physical Exam  Constitutional: He appears well-developed.  HENT:  Head: Atraumatic.  Eyes:  Pupils somewhat dilated.  Neck: Neck supple.  Cardiovascular: Regular rhythm.  Pulmonary/Chest: Effort normal.  Abdominal: Soft.  Musculoskeletal: He exhibits edema.  Neurological: He is alert.  Patient is alert.  However slurred speech and mildly confused.  Somewhat difficult to get a history from.  Skin: Skin is warm. Capillary refill takes less than 2 seconds.     ED Treatments / Results  Labs (all labs ordered are listed, but only abnormal results are displayed) Labs Reviewed  ETHANOL - Abnormal; Notable for the following components:      Result Value   Alcohol, Ethyl (B) 351 (*)    All other components within normal limits  ACETAMINOPHEN LEVEL - Abnormal; Notable for the following components:   Acetaminophen (Tylenol), Serum <10 (*)    All other components within normal limits  CBC - Abnormal; Notable for the following components:   RDW 17.1 (*)    Platelets 87 (*)    All other components within normal limits  RAPID URINE DRUG SCREEN, HOSP PERFORMED - Abnormal; Notable for the following components:   Benzodiazepines POSITIVE (*)    All other components within normal limits  COMPREHENSIVE METABOLIC PANEL - Abnormal; Notable for the following components:   CO2 20 (*)    Glucose, Bld 116 (*)    All other components within normal limits  SALICYLATE LEVEL  PLATELET COUNT    EKG EKG Interpretation  Date/Time:  Saturday February 02 2018 23:32:40 EDT Ventricular Rate:  102 PR Interval:    QRS Duration: 95 QT Interval:  344 QTC Calculation: 449 R Axis:   19 Text Interpretation:  Sinus tachycardia Borderline prolonged PR interval Confirmed by Benjiman Core [phone]) on 02/03/2018 5:54:06  AM   Radiology No results found.  Procedures Procedures (including critical care time)  Medications Ordered in ED Medications  sodium chloride 0.9 % bolus 1,000 mL (0 mLs Intravenous Stopped 02/03/18 0238)     Initial Impression / Assessment and Plan / ED Course  I have reviewed the triage vital signs and the nursing notes.  Pertinent labs & imaging results that were available during my care of the patient were reviewed by me and considered in my medical decision making (see chart for details).     Patient brought in with altered  mental status.  Initially decreased mental status but after about 6 hours had it greatly improved.  Patient states he had not taken an overdose of the Ultram and Valium.  States he did take 2 Valium but had the Ultram in the morning but not in the evening.  States he keeps his pills in a different container for those medicines.  Alcohol level was elevated 350.  Likely combination of the alcohol and the Valium made his mental status off.  Will discharge home.  Final Clinical Impressions(s) / ED Diagnoses   Final diagnoses:  Accidental drug overdose, initial encounter    ED Discharge Orders    None       Benjiman Core, MD 02/03/18 [phone]

## 2018-02-03 NOTE — ED Notes (Addendum)
All medications brought in were returned to the patient (tramadol, primidone, diazepam and propranolol).

## 2018-02-03 NOTE — ED Notes (Signed)
Patient is alert and oriented. Steady gait noted. Family has been notified of discharge and will pick him up.

## 2018-02-03 NOTE — ED Notes (Signed)
Patient's wife is concerned he may have fallen and re-injured his back during this episode.

## 2018-02-03 NOTE — ED Notes (Signed)
Please call daughter with any changes/ updates.

## 2018-02-03 NOTE — ED Notes (Signed)
No TTS order needed per MD.

## 2018-02-08 DIAGNOSIS — F331 Major depressive disorder, recurrent, moderate: Secondary | ICD-10-CM | POA: Diagnosis not present

## 2018-02-15 DIAGNOSIS — F331 Major depressive disorder, recurrent, moderate: Secondary | ICD-10-CM | POA: Diagnosis not present

## 2018-02-21 DIAGNOSIS — F331 Major depressive disorder, recurrent, moderate: Secondary | ICD-10-CM | POA: Diagnosis not present

## 2018-02-26 DIAGNOSIS — F331 Major depressive disorder, recurrent, moderate: Secondary | ICD-10-CM | POA: Diagnosis not present

## 2018-03-07 DIAGNOSIS — S22080A Wedge compression fracture of T11-T12 vertebra, initial encounter for closed fracture: Secondary | ICD-10-CM | POA: Diagnosis not present

## 2018-03-07 DIAGNOSIS — Z6829 Body mass index (BMI) 29.0-29.9, adult: Secondary | ICD-10-CM | POA: Diagnosis not present

## 2018-03-07 DIAGNOSIS — I10 Essential (primary) hypertension: Secondary | ICD-10-CM | POA: Diagnosis not present

## 2018-03-09 ENCOUNTER — Encounter (HOSPITAL_COMMUNITY): Payer: Self-pay | Admitting: Behavioral Health

## 2018-03-09 ENCOUNTER — Ambulatory Visit (HOSPITAL_COMMUNITY)
Admission: RE | Admit: 2018-03-09 | Discharge: 2018-03-09 | Disposition: A | Discharge status: Home / Self Care | Payer: BLUE CROSS/BLUE SHIELD | Attending: Psychiatry | Admitting: Psychiatry

## 2018-03-09 DIAGNOSIS — R030 Elevated blood-pressure reading, without diagnosis of hypertension: Secondary | ICD-10-CM | POA: Diagnosis not present

## 2018-03-09 DIAGNOSIS — F329 Major depressive disorder, single episode, unspecified: Secondary | ICD-10-CM | POA: Insufficient documentation

## 2018-03-09 DIAGNOSIS — R450 Nervousness: Secondary | ICD-10-CM | POA: Diagnosis not present

## 2018-03-09 DIAGNOSIS — F1010 Alcohol abuse, uncomplicated: Secondary | ICD-10-CM | POA: Insufficient documentation

## 2018-03-09 NOTE — BH Assessment (Signed)
Assessment Note  Charles Villarreal is an 52 y.o. male who presented as a walk-in to [facility] seeking help for his depression and alcohol problem.  Patient states that he has a long history of drinking, but has experienced sobriety for five years starting in 2007, but relapsed in 2012.  Patient states that he has been drinking 3-5 fifths of alcohol weekly and states that on average that he drinks four days weekly.  He states that he normally does not have withdrawal symptoms when he stops drinking. However, he states that they longest that he has gone without drinking most recently was two months ago and he was able to stay sober for 27 days.  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  Patient states that his marriage is strained due to his use and states that his wife "has had enough."  Patient states that he has three daughters in college, but only two are currently living at home.  Patient states that he has been in detox and intensive outpatient treatment at [facility] in 2007.  Patient states that he also has a history of depression, but states that he is currently not on medication for his depression.  He states that he has been seeing a therapist, Adelene Amas, weekly, but states that he has not seen her in the past two weeks because she is on vacation.  Patient identifies that he is experiencing the following depressive symptoms:  Sleep disturbance, increased fatigue, decrease appetite and energy/motivation.  Patient states that he fractured his back in April 2018 and recently had back surgery and states that he has lost fifteen pounds since then. He states that he also struggles with chronic pain and states that his current level of pain is a 4 on a scale of 0-10 with 10 being the worst.  Patient denies any past or present  SI/attempts.  He states that he has never experienced any homicidal thoughts and he denies any history of psychosis.  Patient presented as alert and oriented.  His mood was somewhat depressed and his anxiety level was minimal.  He was tremulous, but has been diagnosed with an essential tremor.  His memory appear to be intact.  He was pleasant and cooperative.  His eye contact was good and his speech was clear.  His thoughts were goal directed.  Patient states that he is only sleeping five hours per night.  His appetite is decrased.  He denies any history of abuse or self-mutilation.  Patient states that he is ready to make a change in his life and states that he wants to start a new path.  He appeared to be receptive to various treatment options.    Diagnosis: F33.2 Major Depressive Disorder Recurrent Severe without Psychotic Features and F10.20 Alcohol Use Disorder Severe  Past Medical History:  Past Medical History:  Diagnosis Date  . Complication of anesthesia    aggression  . Depression   . GERD (gastroesophageal reflux disease)   . Gout   . HSV-1 (herpes simplex virus 1) infection   . Hyperlipidemia   . Hypertension   . Low back pain   . Sarcoidosis   . Tremor     Past Surgical History:  Procedure Laterality Date  . ANTERIOR LAT LUMBAR FUSION N/A 10/09/2017   Procedure: Thoracic  twelve Corpectomy, Thoracic eleven-Lumbar one  lateral plate,   Anterior Lateral Interbody Fusion with Corpectomy Cage ;  Surgeon: Julio SicksPool, Henry, MD;  Location: [facility] OR;  Service: Neurosurgery;  Laterality: N/A;  . APPENDECTOMY    . KNEE ARTHROPLASTY  1995   Lt  . UMBILICAL HERNIA REPAIR      Family History:  Family History  Problem Relation Age of Onset  . Alcoholism Father   . Alcoholism Sister   . Hypertension Mother   . Alcoholism Other     Social History:  reports that he has never smoked. He quit smokeless tobacco use about 21 years ago. His smokeless tobacco use included chew. He reports  that he drinks alcohol. He reports that he does not use drugs.  Additional Social History:  Alcohol / Drug Use Pain Medications: Patient states that he takes Tramadol as needed Prescriptions: patient states that he is not abusing any of his prescriptions Over the Counter: Patient states that he takes advil for pain History of alcohol / drug use?: Yes Longest period of sobriety (when/how long): Patient states that he was sober from 2007 to 2012.  States that he was recently sober for 27 days approximately 2 months ago Negative Consequences of Use: Personal relationships, Legal(DWI in 2006) Withdrawal Symptoms: Tremors, Change in blood pressure, Patient aware of relationship between substance abuse and physical/medical complications Substance #1 Name of Substance 1: alcohol 1 - Age of First Use: first use was at [age], more regular use at age 52 1 - Amount (size/oz): 3-5 fifths weekly 1 - Frequency: 4 days weekly 1 - Duration: since 2012 1 - Last Use / Amount: Last night 6 pm.  Patient states that he drank a little more than a pint  CIWA:   COWS:    Allergies: No Known Allergies  Home Medications:  (Not in a hospital admission)  OB/GYN Status:  No LMP for male patient.  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 : No Suicidal Intent: No Has patient had any suicidal intent within the past 6 months prior to admission? : No Is patient at risk for suicide?: No Suicidal Plan?: No Has patient had any suicidal plan within the past 6 months prior to admission? : No Access to Means: (has guns in home (secured)) What has been your use of drugs/alcohol within the last 12 months?: (drinking heavily 4 days weekly) Previous Attempts/Gestures: No How many times?: 0 Other Self Harm Risks: none Triggers for Past Attempts: None known Intentional Self Injurious Behavior: None Family Suicide History: Unknown Recent stressful life event(s): Financial Problems, Other (Comment) Persecutory voices/beliefs?: (marriage is strained due to his drinking) Depression: Yes Depression Symptoms: Insomnia, Fatigue Suicide prevention information given to non-admitted patients: Not  applicable  Risk to Others within the past 6 months Homicidal Ideation: No Does patient have any lifetime risk of violence toward others beyond the six months prior to admission? : No Thoughts of Harm to Others: No Current Homicidal Intent: No Current Homicidal Plan: No Access to Homicidal Means: No Identified Victim: none History of harm to others?: No Assessment of Violence: None Noted Violent Behavior Description: (none) Does patient have access to weapons?: Yes (Comment)(has guns) Criminal Charges Pending?: No Does patient have a court date: No Is patient on probation?: No  Psychosis Hallucinations: None noted Delusions: None noted  Mental Status Report Appearance/Hygiene: Unremarkable Eye Contact: Good Motor Activity: Tremors Speech: Logical/coherent Level of Consciousness:  Alert Mood: Depressed Affect: Appropriate to circumstance Anxiety Level: Minimal Thought Processes: Coherent, Relevant Judgement: Impaired Orientation: Person, Place, Time, Situation Obsessive Compulsive Thoughts/Behaviors: Moderate  Cognitive Functioning Concentration: Normal Memory: Recent Intact, Remote Intact Is patient IDD: No Is patient DD?: No Insight: Good Impulse Control: Poor Appetite: Fair Have you had any weight changes? : Loss Amount of the weight change? (lbs): (15 pounds since February) Sleep: Decreased Total Hours of Sleep: 5 Vegetative Symptoms: None  ADLScreening [facility] Assessment Services) Patient's cognitive ability adequate to safely complete daily activities?: Yes Patient able to express need for assistance with ADLs?: Yes Independently performs ADLs?: Yes (appropriate for developmental age)  Prior Inpatient Therapy Prior Inpatient Therapy: Yes Prior Therapy Dates: 2007 Prior Therapy Facilty/Provider(s): ([facility]) Reason for Treatment: (detox and residential treatment for ETOH problem)  Prior Outpatient Therapy Prior Outpatient Therapy: Yes Prior Therapy Dates: (2007 and current) Prior [facility]ilty[facility] ([facility] Reason for Treatment: (alcohol and depression) Does patient have an ACCT team?: No Does patient have Intensive In-House Services?  : No Does patient have [facility] services? : No Does patient have P4CC services?: No  ADL Screening (condition at time of admission) Patient's cognitive ability adequate to safely complete daily activities?: Yes Is the patient deaf or have difficulty hearing?: No Does the patient have difficulty seeing, even when wearing glasses/contacts?: No Does the patient have difficulty concentrating, remembering, or making decisions?: No Patient able to express need for assistance with ADLs?: Yes Does the patient have difficulty dressing or bathing?: No Independently  performs ADLs?: Yes (appropriate for developmental age)     Therapy Consults (therapy consults require a physician order) PT Evaluation Needed: No OT Evalulation Needed: No SLP Evaluation Needed: No Abuse/Neglect Assessment (Assessment to be complete while patient is alone) Abuse/Neglect Assessment Can Be Completed: Yes Physical Abuse: Denies Verbal Abuse: Denies Sexual Abuse: Denies Exploitation of patient/patient's resources: Denies Self-Neglect: Denies Values / Beliefs Cultural Requests During Hospitalization: None Spiritual Requests During Hospitalization: None Consults Spiritual Care Consult Needed: No Social Work Consult Needed: No Merchant navy officer (For Healthcare) Does Patient Have a Medical Advance Directive?: No Would patient like information on creating a medical advance directive?: No - Patient declined Nutrition Screen- [facility] Adult/WL/AP Has the patient recently lost weight without trying?: Yes, 14-23 lbs. Has the patient been eating poorly because of a decreased appetite?: Yes Malnutrition Screening Tool Score: 3  Additional Information 1:1 In Past 12 Months?: No CIRT Risk: No Elopement Risk: No Does patient have medical clearance?: Yes     Disposition:  Per Shuvon Rankin, NP, Patient does not meet admission criteria and is most appropriate for SAIOP.  Patient was given information on the [facility] with contact information. Disposition Initial Assessment Completed for this Encounter: Yes Disposition of Patient: (Pending review with  FNP)  On Site Evaluation by:   Reviewed with Physician:    Arnoldo Lenis Becci Batty 03/09/2018 8:05 AM

## 2018-03-09 NOTE — H&P (Signed)
Behavioral Health Medical Screening Exam  Charles Villarreal is an 52 y.o. male presents as walk in [facility] with complaints of alcohol abuse and seeking help.  Patient states that he done an intensive outpatient in 2013 and was sober for 5 years afterward.  Patient is currently going to AA and also has a sponsor but not using him as should.  Patient denies suicidal/self-harm/homicidal ideation, psychosis, and paranoia.   Total Time spent with patient: 30 minutes  Psychiatric Specialty Exam: Physical Exam  Constitutional: He is oriented to person, place, and time. He appears well-nourished.  Neck: Normal range of motion. Neck supple.  Cardiovascular:  Elevated BP  Respiratory: Effort normal.  Musculoskeletal: Normal range of motion.  Neurological: He is alert and oriented to person, place, and time.  Skin: Skin is warm and dry.  Psychiatric: His speech is normal and behavior is normal. Judgment and thought content normal. His mood appears anxious. Cognition and memory are normal.    Review of Systems  Neurological: Negative for tremors, seizures, loss of consciousness and headaches.  Psychiatric/Behavioral: Negative for depression, hallucinations, memory loss, substance abuse and suicidal ideas. The patient is nervous/anxious (Stable). The patient does not have insomnia.   All other systems reviewed and are negative.   Blood pressure (!) 154/110, pulse 60, temperature 97.8 F (36.6 C), resp. rate 18, SpO2 98 %.There is no height or weight on file to calculate BMI.  General Appearance: Casual  Eye Contact:  Good  Speech:  Clear and Coherent and Normal Rate  Volume:  Normal  Mood:  Depressed  Affect:  Appropriate  Thought Process:  Coherent and Goal Directed  Orientation:  Full (Time, Place, and Person)  Thought Content:  Logical  Suicidal Thoughts:  No  Homicidal Thoughts:  No  Memory:  Immediate;   Good Recent;   Good Remote;   Good  Judgement:  Intact  Insight:  Good   Psychomotor Activity:  Normal  Concentration: Concentration: Good and Attention Span: Good  Recall:  Good  Fund of Knowledge:Good  Language: Good  Akathisia:  No  Handed:  Right  AIMS (if indicated):     Assets:  Communication Skills Desire for Improvement Financial Resources/Insurance Housing Social Support  Sleep:       Musculoskeletal: Strength & Muscle Tone: within normal limits Gait & Station: normal Patient leans: N/A  Blood pressure (!) 154/110, pulse 60, temperature 97.8 F (36.6 C), resp. rate 18, SpO2 98 %.  Recommendations:  Follow up with PCP related to increased blood pressure; ER if unable to see. Referral to Intensive outpatient for psychiatric services.   Recommendations:  Disposition: No evidence of imminent risk to self or others at present.   Patient does not meet criteria for psychiatric inpatient admission. Supportive therapy provided about ongoing stressors. Refer to IOP. Discussed crisis plan, support from social network, calling 911, coming to the Emergency Department, and calling Suicide Hotline.  Based on my evaluation the patient does not appear to have an emergency medical condition.  Charles Petta, NP 03/09/2018, 9:38 AM

## 2018-03-11 ENCOUNTER — Telehealth (HOSPITAL_COMMUNITY): Payer: Self-pay | Admitting: Psychology

## 2018-03-12 DIAGNOSIS — F331 Major depressive disorder, recurrent, moderate: Secondary | ICD-10-CM | POA: Diagnosis not present

## 2018-03-19 DIAGNOSIS — F331 Major depressive disorder, recurrent, moderate: Secondary | ICD-10-CM | POA: Diagnosis not present

## 2018-04-08 ENCOUNTER — Encounter: Payer: Self-pay | Admitting: Internal Medicine

## 2018-04-08 ENCOUNTER — Ambulatory Visit (INDEPENDENT_AMBULATORY_CARE_PROVIDER_SITE_OTHER): Payer: BLUE CROSS/BLUE SHIELD | Admitting: Internal Medicine

## 2018-04-08 VITALS — BP 126/80 | HR 55 | Ht 72.0 in | Wt 209.0 lb

## 2018-04-08 DIAGNOSIS — J069 Acute upper respiratory infection, unspecified: Secondary | ICD-10-CM

## 2018-04-08 MED ORDER — AZITHROMYCIN 250 MG PO TABS: ORAL_TABLET | ORAL | 0 refills | Status: DC

## 2018-04-08 MED ORDER — PREDNISONE 10 MG PO TABS: ORAL_TABLET | ORAL | 0 refills | Status: DC

## 2018-04-08 MED ORDER — TRAMADOL HCL 50 MG PO TABS: 50.0000 mg | ORAL_TABLET | ORAL | 0 refills | Status: AC | PRN

## 2018-04-08 MED ORDER — TRAMADOL HCL 50 MG PO TABS: 100.0000 mg | ORAL_TABLET | Freq: Four times a day (QID) | ORAL | 0 refills | Status: DC | PRN

## 2018-04-08 MED ORDER — PANTOPRAZOLE SODIUM 40 MG PO TBEC
40.0000 mg | DELAYED_RELEASE_TABLET | Freq: Every day | ORAL | 2 refills | Status: DC
Start: 2018-04-08 — End: 2018-08-23

## 2018-04-08 NOTE — Patient Instructions (Signed)
Whenever having respiratory flares start protonix 40 mg Take 30-60 min before first meal of the day until flare is over for a week  Zpak   Take delsym two tsp every 12 hours and supplement if needed with  tramadol 50 mg up to 1-2 every 4 hours to suppress the urge to cough. Swallowing water or using ice chips/non mint and menthol containing candies (such as lifesavers or sugarless jolly ranchers) are also effective.  You should rest your voice and avoid activities that you know make you cough.  Once you have eliminated the cough for 3 straight days try reducing the tramadol first,  then the delsym as tolerated.    If not improving > Prednisone 10 mg take  4 each am x 2 days,   2 each am x 2 days,  1 each am x 2 days and stop   GERD (REFLUX)  is an extremely common cause of respiratory symptoms just like yours , many times with no obvious heartburn at all.    It can be treated with medication, but also with lifestyle changes including elevation of the head of your bed (ideally with 6 inch  bed blocks),  Smoking cessation, avoidance of late meals, excessive alcohol, and avoid fatty foods, chocolate, peppermint, colas, red wine, and acidic juices such as orange juice.  NO MINT OR MENTHOL PRODUCTS SO NO COUGH DROPS  USE SUGARLESS CANDY INSTEAD (Jolley ranchers or Stover's or Life Savers) or even ice chips will also do - the key is to swallow to prevent all throat clearing. NO OIL BASED VITAMINS - use powdered substitutes.   If not 100% better in 2 weeks

## 2018-04-08 NOTE — Progress Notes (Signed)
Subjective:    Patient ID: Charles Villarreal, male    DOB: 06/12/1966    MRN: [id number]  Brief patient profile:  [age] yowm with minimal smoking hx , dx  of sarcoid (Positive transbronchial biopsy 05/19/03)  With  asymptomatic hilar adenopathy. Hx of atypical chest pain that was treated with Protonix after a negative cardiac workup was completed on May 13, 2003.  Did not ever require chronic steroids.    History of Present Illness   08/29/2013 acuteov/Artasia Thang re: URI Chief Complaint  Patient presents with  . Follow-up    Sinus congestion and cough w/ brownish mucus x1 week.  Just got over the flu   08/22/13 acute onset tired achy fever/ chills/ no appetite but no n or v, exp to his two children same symptoms and all tested pos flu but he was never rx'd  and  Then developed cough/congestion  while all other symptoms improving in setting of nasal congestion. No  need for saba  rec Whenever having respiratory flares start protonix 40 mg Take 30-60 min before first meal of the day until flare is over for a week Zpak Prednisone x 6 days if all else fails Take delsym two tsp every 12 hours and supplement if needed with  tramadol 50 mg up to 2 every 4 hours to suppress the urge to cough.  Once you have eliminated the cough for 3 straight days try reducing the tramadol first,  then the delsym as tolerated.    04/08/2018  Re-establish ov/ Gwendy Boeder re:  Recurrent  Cough on high dose inderal / maint zantac 150 mg hs and tums prn Chief Complaint  Patient presents with  . Pulmonary Consult    Sarcoid f/u. He c/o prod cough with yellow sputum x 5 days.   since last ov uri's several times a year, just take tylenol cold and sinus gets over them typically w/in a few days   Then after [place] developed(around Mar 31 2018) severe ST, nasal and throat congestion then 3 days later cough> yellow mucus x esp severe  last 5 days prior to OV   No better with tylenol cold and sinus, better  with tramadol but ran out    No fever / no sob/ still able to work out as recently as one day prior to OV     No obvious day to day or daytime variability or assoc  mucus plugs or hemoptysis or cp or chest tightness, subjective wheeze or overt sinus or hb symptoms.    Also denies any obvious fluctuation of symptoms with weather or environmental changes or other aggravating or alleviating factors except as outlined above   No unusual exposure hx or h/o childhood pna/ asthma or knowledge of premature birth.  Current Allergies, Complete Past Medical History, Past Surgical History, Family History, and Social History were reviewed in [facility] electronic medical record.  ROS  The following are not active complaints unless bolded Hoarseness, sore throat some better since onset, dysphagia, dental problems, itching, sneezing,  nasal congestion or discharge of excess mucus or purulent secretions, ear ache,   fever, chills, sweats, unintended wt loss or wt gain, classically pleuritic or exertional cp,  orthopnea pnd or arm/hand swelling  or leg swelling, presyncope, palpitations, abdominal pain, anorexia, nausea, vomiting, diarrhea  or change in bowel habits or change in bladder habits, change in stools or change in urine, dysuria, hematuria,  rash, arthralgias, visual complaints, headache, numbness, weakness or  ataxia or problems with walking or coordination,  change in mood or  memory.        Current Meds  Medication Sig  . Ascorbic Acid (VITAMIN C) 1000 MG tablet Take 1,000 mg by mouth daily.  Marland Kitchen. aspirin EC 81 MG tablet Take 81 mg by mouth daily.  Marland Kitchen. BEE POLLEN PO Take 1 Scoop by mouth daily.  . Melatonin 5 MG TABS Take 5 mg by mouth at bedtime.  . primidone (MYSOLINE) 50 MG tablet Take 1 tablet (50 mg total) by mouth at bedtime. (Patient taking differently: Take 50 mg by mouth daily. )  . propranolol ER (INDERAL LA) 120 MG 24 hr capsule Take 1 capsule (120 mg total) by mouth daily.  .  ranitidine (ZANTAC) 150 MG capsule Take 150 mg by mouth every morning.   . [DISCONTINUED] traMADol (ULTRAM) 50 MG tablet Take 2 tablets (100 mg total) by mouth every 6 (six) hours as needed. (Patient taking differently: Take 100 mg by mouth every 6 (six) hours as needed for severe pain. )  . [DISCONTINUED] traMADol (ULTRAM) 50 MG tablet Take 2 tablets (100 mg total) by mouth every 6 (six) hours as needed.        Past Medical History:  INSOMNIA (ICD-780.52)  PULMONARY SARCOIDOSIS (ICD-135) .........................Marland Kitchen.Avanelle Pixley  - Positive transbronchial biopsy 05/19/03  - Opth eval done each Fall > no sarcoid (Dr Lindell SparPreston's office)  Health Maintenance.........................................................Eden EmmsMary Jermie [name]  - Pneumovax April 05, 2010 ( age 52, booster needed age 52)         Objective:   Physical Exam     wt 206 Jul 27, 2009 > 213 February 22, 2010 > 219 12/22/2010 >211 09/08/2011 > 10/05/2011  216> 211 3/22  05/17/2012  224 > 08/30/2013  214  > 04/08/2018  209    amb  pleasant wm / looks quite healthy   Vital signs reviewed - Note on arrival 02 sats  100% on RA   HEENT: nl dentition, turbinates bilaterally, and oropharynx. Nl external ear canals without cough reflex   NECK :  without JVD/Nodes/TM/ nl carotid upstrokes bilaterally   LUNGS: no acc muscle use,  Nl contour chest which is clear to A and P bilaterally without cough on insp or exp maneuvers   CV:  RRR  no s3 or murmur or increase in P2, and no edema   ABD:  soft and nontender with nl inspiratory excursion in the supine position. No bruits or organomegaly appreciated, bowel sounds nl  MS:  Nl gait/ ext warm without deformities, calf tenderness, cyanosis or clubbing No obvious joint restrictions   SKIN: warm and dry without lesions    NEURO:  alert, approp, nl sensorium with  no motor or cerebellar deficits apparent.              Assessment & Plan:

## 2018-04-09 ENCOUNTER — Encounter: Payer: Self-pay | Admitting: Internal Medicine

## 2018-04-09 DIAGNOSIS — F331 Major depressive disorder, recurrent, moderate: Secondary | ICD-10-CM | POA: Diagnosis not present

## 2018-04-09 NOTE — Assessment & Plan Note (Addendum)
Flare p jet travel early aug 2019 > rec max rx for GERD/ cyclical cough/ zpak > f/u in 2 weeks if not 100% better  Of the three most common causes of  Sub-acute / recurrent or chronic cough, only one (GERD)  can actually contribute to/ trigger  the other two (asthma and post nasal drip syndrome)  and perpetuate the cylce of cough.  While not intuitively obvious, many patients with chronic low grade reflux do not cough until there is a primary insult that disturbs the protective epithelial barrier and exposes sensitive nerve endings.   This is typically viral but can due to PNDS and  either may apply here.     The point is that once this occurs, it is difficult to eliminate the cycle  using anything but a maximally effective acid suppression regimen at least in the short run, accompanied by an appropriate diet to address non acid GERD and control / eliminate the cough itself for at least 3 days with tramadol.  Will also rx with zpak though strongly doubt bacterial infection here   Total time devoted to counseling  > 50 % of initial 40 min office visit:  review case with pt/ discussion of options/alternatives/ personally creating written customized instructions  in presence of pt  then going over those specific  Instructions directly with the pt including how to use all of the meds but in particular covering each new medication in detail and the difference between the maintenance= "automatic" meds and the prns using an action plan format for the latter (If this problem/symptom => do that organization reading Left to right).  Please see AVS from this visit for a full list of these instructions which I personally wrote for this pt and  are unique to this visit.

## 2018-04-23 DIAGNOSIS — F331 Major depressive disorder, recurrent, moderate: Secondary | ICD-10-CM | POA: Diagnosis not present

## 2018-04-26 NOTE — Progress Notes (Signed)
Subjective:    Charles Villarreal was seen in consultation in the movement disorder clinic at the request of [name].  His PCP is Charles Villarreal, Charles ColeMary Villarreal, Charles Villarreal.  The evaluation is for tremor.  He was previously seen at [facility] for the [facility].  I reviewed those records via Care Everywhere.   The pt states that the first sx started about 2 years ago and it was an acute episode of loss of balance and dizziness.  He was at [place] [employer] [place]  He was dizzy and off balance for about 2 hours.  Admittedly, it was very hot outside.  Not long thereafter, he would note intermittent trouble writing because of tremor.  He would note tremor with other activites as well, such as with drinking out of a cup or using the arm.  This has gotten better with diet and lifestyle modifications (less caffeine, less alcohol, more exercise, adding propranolol).  The tremor can be R or L and is independent of activity.  He was started on propranolol by [name] for the tremor and BP.  [facility] and had wore a BP monitor.  His father and grandfather had hx of tremor.  He does not know if his grandfather ever got a dx and his father is still living, but is undiagnosed as far as he knows with any particular tremor type.  He was on prednisone around Christmas for flu (has hx of pulmonary sarcoid) and that didn't seem to make tremor worse.   Affected by caffeine:  yes, but decreased caffeine to one diet coke per day and one cup per day Affected by alcohol:  yes Affected by stress:  yes Affected by fatigue:  no (does seem worse around 1:30-2 pm) Spills soup if on spoon:  yes (intermittent) Spills glass of liquid if full:  no Affects ADL's (tying shoes, brushing teeth, etc):  no  03/05/14 update:  The patient was seen back in followup today.  He has had several tests in regards to his tremor, but also in regards to the fact that he had some concern in features on his physical exam or worrisome for a possible parkinsonian states,  including loss of balance, dizziness and elevated tone on the right.  Fortunately, his DaT scan done since last visit was unremarkable.  Unfortunately, his insurance does not pay for the scan.  I have written an appeal letter as this was completely within indications.  He did have other tests done since last visit.  His 24-hour urine for metanephrine test negative.  His 2 hour glucose tolerance test was unremarkable.  B12 was mildly low at 381.  He had plasma catecholamines drawn.  Norepinephrine was normal, as is dopamine.  Epinephrine was slightly elevated, but I think that this was just incidental.  Pt reports that clinically he is feeling much better.  Dizziness is virtually gone.  He only has this 1-2 times per month and it was daily.  He is also exercising every single day in the morning now, which has helped.  He thinks that the vitamin B12 and vitamin C have given him some energy, along with the exercises.  While his blood pressure was somewhat elevated today (states that he was almost late to get here), his blood pressure has [name] control.  He has gotten a fit bit and has become somewhat competitive with his family regarding this.  This has turned out to be a positive thing.  02/24/15 update:  The  patient returns today for follow-up.  Overall, the patient states that he has done well over the year, until very recently.  He ran out of the medication and now has noticed an increase in tremor.  Once he made a follow-up here, he was given a refill on the medication and restarted it on Monday so he has only been back on it for 2 days.  His tremor is improving but he still is noticing more than he originally was.  Otherwise, the patient states that he is doing well and has no new medical problems.  He does state that he is exercising more.  04/20/16 update:  The patient is following up today.  I have not seen him in over a year.  He called me not long after our last visit to state that his tremor is  increasing and we increase his Inderal LA total of 120 mg daily.  He states that tremor was well controlled until he ran out of it 4 days ago.  He is therefore tremulous today.    10/31/16 update:  Pt f/u today, earlier than expected.  Wife accompanies him and supplements the history.    On Inderal LA 120 mg daily.    States that he no longer really has periods of normalcy and has more times of tremor.  Hurt his back and can't exercise as much and thinks that this contributes but isn't only factor.  Hands and legs.  More with use of hands.  Trouble staying asleep as well; thinks that tremor awakens him.  No new medications.  Was able to back off on chol med because diet is better.  Feels that balance has not been as good.  No falls.  12/26/16 update:  Pt seen today in f/u for tremor.  Patient is currently on primidone, 50 mg daily which was added last visit to his Inderal LA, 120 mg daily.  States that primidone did help but it caused sleep trouble.  Pt states that he went fishing this weekend and he hurt his back and went to a PA this weekend and got some prednisone yesterday.  This hasn't worsened his tremor yet.  On flexeril right now for his back.    05/03/17 update:  Patient seen today in follow-up for essential tremor.  Patient is on Inderal LA, 120 mg daily.  He is also on primidone, 50 mg daily.  The patient reports that "its so much better than it was but there is a little tremor lately."  He is able to write for himself lately.  The addition of primidone really helped.     Last time, he told me he had injured his back while fishing on the boat.  Turns out that he sustained a fracture of the back.  He had seen Dr. Dutch Quint.  It is slowly getting better.  He only uses ultram when he exercises.    09/25/17 update: Patient is seen today in follow-up for essential tremor.  He is seen earlier than scheduled. This patient is accompanied in the office by his wife who supplements the history.   He is on Inderal LA,  120 mg daily.  He is also on primidone, 50 mg daily.  The patient states that on Sunday he woke up and had severe back pain (not unusual) but when he went to stand up he was very off balance and somewhat dizzy.  Looking back, he didn't go to work the previous thurs/friday due to lack of balance.  When asked, he stated that the lack of balance was much worse than dizziness and he does not describe any vertigo.  He also noted some tremor in the legs.  He started on valium for his back in the beginning of Jan and he didn't note that it was affecting balance but wife noted that it was affecting him cognitively.  Pt does think that it was causing a hang over effect.  His balance is slowing getting better since Sunday.  He does feel a little weak in proximal arms and legs and they feel sore, as if he was working out.  He has been on lipitor for years.  He has no lateralizing weakness/paresthesias.  Has MRI on back later today at neurosx office.  Also had emesis x 2 days at dinner time.  On tramadol but doesn't think that emesis associated with that.    11/02/17 update: Patient is seen today in follow-up. He is accompanied by his wife who supplements the history.   Patient had a T12 corpectomy with T11-L1 anterior lateral fusion.  This was completed on October 09, 2017.  He was discharged October 12, 2017 but returned to the emergency room on October 26, 2017.  He had been switched to [facility] from OxyContin and Valium and became more somnolent and confused.  CT of the head was completed and was negative.  This was felt likely due to medication effect.  He is only on tramadol now. He reports pain is better than before surgery but he still is in pain.   He has a f/u on 3/27.  Only walking for exercise.  In regards to tremor, the patient remains on Inderal LA, 120 mg daily and primidone 50 mg daily.  Tremor has been stable and even some better.  He is off of several BP meds.  States that he used to drink and no longer drinks  EtOH.  He also lost weight and feels that this has helped BP.    04/30/18 update:  Pt seen in f/u for tremor.  On inderal LA, 120 mg daily and primidone 50 mg daily.  Records are reviewed since last visit.  The patient went to the emergency room on February 03, 2018 due to mental status change, which turned out to be due to accidental drug overdose (Valium).  His alcohol level was also elevated and it was felt that the combination of alcohol and Valium caused the mental status change.  He was seen by behavioral health counselor.  Those notes are reviewed.  Noted that the patient had a long history of alcohol abuse, but was sober from 2007-2012.  He then relapsed.  He had since been drinking 3-5 fifths of alcohol per week and on average he was drinking 4 days/week.  Been sober for 60 days. Going to AA and seeing a Veterinary surgeon.  Feels great.  States that huge difference in tremor now that not drinking.  Still has tremor but not like was.  Knows that he used to drink some to control tremor.    Outside reports reviewed: historical medical records.  No Known Allergies  Current Outpatient Medications on File Prior to Visit  Medication Sig Dispense Refill  . Ascorbic Acid (VITAMIN C) 1000 MG tablet Take 1,000 mg by mouth daily.    Marland Kitchen aspirin EC 81 MG tablet Take 81 mg by mouth daily.    Marland Kitchen BEE POLLEN PO Take 1 Scoop by mouth daily.    . Melatonin 5 MG TABS Take 5  mg by mouth at bedtime.    . pantoprazole (PROTONIX) 40 MG tablet Take 1 tablet (40 mg total) by mouth daily. Take 30-60 min before first meal of the day 30 tablet 2  . primidone (MYSOLINE) 50 MG tablet Take 1 tablet (50 mg total) by mouth at bedtime. (Patient taking differently: Take 50 mg by mouth daily. ) 90 tablet 2  . propranolol ER (INDERAL LA) 120 MG 24 hr capsule Take 1 capsule (120 mg total) by mouth daily. 90 capsule 3   No current facility-administered medications on file prior to visit.     Past Medical History:  Diagnosis Date  .  Complication of anesthesia    aggression  . Depression   . GERD (gastroesophageal reflux disease)   . Gout   . HSV-1 (herpes simplex virus 1) infection   . Hyperlipidemia   . Hypertension   . Low back pain   . Sarcoidosis   . Tremor     Past Surgical History:  Procedure Laterality Date  . ANTERIOR LAT LUMBAR FUSION N/A 10/09/2017   Procedure: Thoracic twelve Corpectomy, Thoracic eleven-Lumbar one  lateral plate,   Anterior Lateral Interbody Fusion with Corpectomy Cage ;  Surgeon: Julio Sicks, Charles Villarreal;  Location: [facility] OR;  Service: Neurosurgery;  Laterality: N/A;  . APPENDECTOMY    . KNEE ARTHROPLASTY  1995   Lt  . UMBILICAL HERNIA REPAIR      Social History   Socioeconomic History  . Marital status: Married    Spouse name: Not on file  . Number of children: Not on file  . Years of education: Not on file  . Highest education level: Not on file  Occupational History  . Not on file  Social Needs  . Financial resource strain: Not on file  . Food insecurity:    Worry: Not on file    Inability: Not on file  . Transportation needs:    Medical: Not on file    Non-medical: Not on file  Tobacco Use  . Smoking status: Never Smoker  . Smokeless tobacco: Former Neurosurgeon    Types: Chew  Substance and Sexual Activity  . Alcohol use: Yes    Comment: Drinkin 3-5 fifths weekly  . Drug use: No  . Sexual activity: Not on file  Lifestyle  . Physical activity:    Days per week: Not on file    Minutes per session: Not on file  . Stress: Not on file  Relationships  . Social connections:    Talks on phone: Not on file    Gets together: Not on file    Attends religious service: Not on file    Active member of club or organization: Not on file    Attends meetings of clubs or organizations: Not on file    Relationship status: Not on file  . Intimate partner violence:    Fear of current or ex partner: Not on file    Emotionally abused: Not on file    Physically abused: Not on file    Forced  sexual activity: Not on file  Other Topics Concern  . Not on file  Social History Narrative  . Not on file    Family Status  Relation Name Status  . Father  Alive  . Sister  Alive  . Mother  Alive  . Other Grandfather Deceased     Review of Systems A complete 10 system ROS was obtained and was negative apart from what is mentioned.  Objective:   VITALS:   Vitals:   04/30/18 1414  BP: 130/78  Pulse: 68  SpO2: 98%  Weight: 208 lb (94.3 kg)  Height: 6\' 1"  (1.854 m)   Wt Readings from Last 3 Encounters:  04/30/18 208 lb (94.3 kg)  04/08/18 209 lb (94.8 kg)  02/02/18 210 lb (95.3 kg)   GEN:  The patient appears stated age and is in NAD. HEENT:  Normocephalic, atraumatic.  The mucous membranes are moist. The superficial temporal arteries are without ropiness or tenderness. CV:  RRR Lungs:  CTAB Neck/HEME:  There are no carotid bruits bilaterally.  Neurological examination:  Orientation: The patient is alert and oriented x3. Cranial nerves: There is good facial symmetry. The speech is fluent and clear. Soft palate rises symmetrically and there is no tongue deviation. Hearing is intact to conversational tone. Sensation: Sensation is intact to light touch throughout Motor: Strength is 5/5 in the bilateral upper and lower extremities.   Shoulder shrug is equal and symmetric.  There is no pronator drift.  Movement examination: Tone: There is normal tone in the UE/LE Abnormal movements: there is mild tremor of both outstretched hands that increases with intention.  he has minimal difficulty with archimedes spirals. Coordination:  There is no decremation with RAM's, with any form of RAMS, including alternating supination and pronation of the forearm, hand opening and closing, finger taps, heel taps and toe taps. Gait and Station: The patient has no difficulty arising out of a deep-seated chair without the use of the hands. The patient's stride length is normal.      LABS  Lab Results  Component Value Date   HGBA1C 5.4 08/05/2015   Lab Results  Component Value Date   VITAMINB12 561 09/25/2017   Lab Results  Component Value Date   TSH 2.21 09/25/2017     Chemistry      Component Value Date/Time   NA 138 02/03/2018 0046   K 4.3 02/03/2018 0046   CL 106 02/03/2018 0046   CO2 20 (L) 02/03/2018 0046   BUN 14 02/03/2018 0046   CREATININE 0.83 02/03/2018 0046   CREATININE 0.97 09/25/2017 0951      Component Value Date/Time   CALCIUM 8.9 02/03/2018 0046   ALKPHOS 94 02/03/2018 0046   AST 33 02/03/2018 0046   ALT 25 02/03/2018 0046   BILITOT 0.4 02/03/2018 0046         Assessment/Plan:   1.  Essential Tremor.  -His DAT scan was negative years ago.    -It has come out since our last visit that alcohol abuse is an issue here.  Previously, I was unaware of this issue).  He has been drinking excessively since 2012 but has now been sober for 60 days.  There is a complex relationship between alcohol and tremor, and certainly the alcohol use could be due to his tremor or have contributed to that.  He agrees but he also still has tremor and reports sobriety x 2 months.  Congratulated him on sobriety.  Encouraged him to continue with counseling and AA  -continue Inderal LA 120 mg.   - primidone, 50 mg daily.  Doing well with this medication and has helped a lot.  Will continue. 2.  Low back pain  -is doing better.  Still has daily pain but better than was 3.  F/u 6-9 months

## 2018-04-30 ENCOUNTER — Ambulatory Visit (INDEPENDENT_AMBULATORY_CARE_PROVIDER_SITE_OTHER): Payer: BLUE CROSS/BLUE SHIELD | Admitting: Neurology

## 2018-04-30 ENCOUNTER — Encounter: Payer: Self-pay | Admitting: Neurology

## 2018-04-30 VITALS — BP 130/78 | HR 68 | Ht 73.0 in | Wt 208.0 lb

## 2018-04-30 DIAGNOSIS — F1020 Alcohol dependence, uncomplicated: Secondary | ICD-10-CM | POA: Insufficient documentation

## 2018-04-30 DIAGNOSIS — F331 Major depressive disorder, recurrent, moderate: Secondary | ICD-10-CM | POA: Diagnosis not present

## 2018-04-30 DIAGNOSIS — G250 Essential tremor: Secondary | ICD-10-CM | POA: Diagnosis not present

## 2018-04-30 NOTE — Progress Notes (Signed)
See if he still considers [place] as PCP. No recent CPE

## 2018-05-14 DIAGNOSIS — F331 Major depressive disorder, recurrent, moderate: Secondary | ICD-10-CM | POA: Diagnosis not present

## 2018-05-16 DIAGNOSIS — Z6828 Body mass index (BMI) 28.0-28.9, adult: Secondary | ICD-10-CM | POA: Diagnosis not present

## 2018-05-16 DIAGNOSIS — S22080A Wedge compression fracture of T11-T12 vertebra, initial encounter for closed fracture: Secondary | ICD-10-CM | POA: Diagnosis not present

## 2018-05-16 DIAGNOSIS — I10 Essential (primary) hypertension: Secondary | ICD-10-CM | POA: Diagnosis not present

## 2018-05-28 DIAGNOSIS — F331 Major depressive disorder, recurrent, moderate: Secondary | ICD-10-CM | POA: Diagnosis not present

## 2018-06-04 DIAGNOSIS — F331 Major depressive disorder, recurrent, moderate: Secondary | ICD-10-CM | POA: Diagnosis not present

## 2018-06-04 NOTE — Progress Notes (Signed)
Have called patient and left 2 messages to call me back to see if he still wants to come here.  He has not returned my call.  Will call again today to attempt to make contact with patient.

## 2018-06-11 DIAGNOSIS — F331 Major depressive disorder, recurrent, moderate: Secondary | ICD-10-CM | POA: Diagnosis not present

## 2018-06-18 DIAGNOSIS — F331 Major depressive disorder, recurrent, moderate: Secondary | ICD-10-CM | POA: Diagnosis not present

## 2018-06-25 DIAGNOSIS — F331 Major depressive disorder, recurrent, moderate: Secondary | ICD-10-CM | POA: Diagnosis not present

## 2018-07-02 DIAGNOSIS — F331 Major depressive disorder, recurrent, moderate: Secondary | ICD-10-CM | POA: Diagnosis not present

## 2018-07-09 DIAGNOSIS — F331 Major depressive disorder, recurrent, moderate: Secondary | ICD-10-CM | POA: Diagnosis not present

## 2018-07-16 DIAGNOSIS — F331 Major depressive disorder, recurrent, moderate: Secondary | ICD-10-CM | POA: Diagnosis not present

## 2018-07-17 DIAGNOSIS — S22080A Wedge compression fracture of T11-T12 vertebra, initial encounter for closed fracture: Secondary | ICD-10-CM | POA: Diagnosis not present

## 2018-07-17 DIAGNOSIS — I10 Essential (primary) hypertension: Secondary | ICD-10-CM | POA: Diagnosis not present

## 2018-07-17 DIAGNOSIS — Z6828 Body mass index (BMI) 28.0-28.9, adult: Secondary | ICD-10-CM | POA: Diagnosis not present

## 2018-07-17 NOTE — Progress Notes (Signed)
Have called patient and LM for patient to return call.  No return call.   LM again today.  This is call #2.  Asked for another return call to let us know if he still wants to continue to come here for his PCP.

## 2018-08-01 DIAGNOSIS — F331 Major depressive disorder, recurrent, moderate: Secondary | ICD-10-CM | POA: Diagnosis not present

## 2018-08-14 DIAGNOSIS — S22080A Wedge compression fracture of T11-T12 vertebra, initial encounter for closed fracture: Secondary | ICD-10-CM | POA: Diagnosis not present

## 2018-08-14 DIAGNOSIS — Z6827 Body mass index (BMI) 27.0-27.9, adult: Secondary | ICD-10-CM | POA: Diagnosis not present

## 2018-08-14 DIAGNOSIS — I10 Essential (primary) hypertension: Secondary | ICD-10-CM | POA: Diagnosis not present

## 2018-08-23 ENCOUNTER — Other Ambulatory Visit: Payer: Self-pay | Admitting: Internal Medicine

## 2018-09-23 DIAGNOSIS — F331 Major depressive disorder, recurrent, moderate: Secondary | ICD-10-CM | POA: Diagnosis not present

## 2018-09-25 ENCOUNTER — Telehealth: Payer: Self-pay | Admitting: Internal Medicine

## 2018-09-25 DIAGNOSIS — S22080A Wedge compression fracture of T11-T12 vertebra, initial encounter for closed fracture: Secondary | ICD-10-CM | POA: Diagnosis not present

## 2018-09-25 DIAGNOSIS — Z6828 Body mass index (BMI) 28.0-28.9, adult: Secondary | ICD-10-CM | POA: Diagnosis not present

## 2018-09-25 NOTE — Telephone Encounter (Signed)
-----   Message from Margaree Mackintosh, MD sent at 04/30/2018  2:43 PM EDT -----   ----- Message ----- From: Vladimir Faster, DO Sent: 04/30/2018   2:42 PM EDT To: Margaree Mackintosh, MD

## 2018-09-25 NOTE — Telephone Encounter (Signed)
I have contacted this patient twice and left messages.  He is yet to return the call.

## 2018-10-01 DIAGNOSIS — F331 Major depressive disorder, recurrent, moderate: Secondary | ICD-10-CM | POA: Diagnosis not present

## 2018-10-02 ENCOUNTER — Telehealth: Payer: Self-pay | Admitting: Nurse Practitioner

## 2018-10-02 ENCOUNTER — Ambulatory Visit (INDEPENDENT_AMBULATORY_CARE_PROVIDER_SITE_OTHER): Payer: BLUE CROSS/BLUE SHIELD | Admitting: Nurse Practitioner

## 2018-10-02 ENCOUNTER — Telehealth: Payer: Self-pay | Admitting: Internal Medicine

## 2018-10-02 ENCOUNTER — Encounter: Payer: Self-pay | Admitting: Nurse Practitioner

## 2018-10-02 DIAGNOSIS — J069 Acute upper respiratory infection, unspecified: Secondary | ICD-10-CM | POA: Diagnosis not present

## 2018-10-02 MED ORDER — PREDNISONE 10 MG PO TABS: ORAL_TABLET | ORAL | 0 refills | Status: DC

## 2018-10-02 NOTE — Telephone Encounter (Signed)
Pt complained of cough for 3 days with occasional light yellow mucus.  PT stated cough keeping him up at night.  OV given at 3pm with TN.  Nothing further needed.

## 2018-10-02 NOTE — Assessment & Plan Note (Signed)
Will defer antibiotic at this time.  Advised patient to call back if symptoms worsen over the next few days.  Patient Instructions  Whenever having respiratory flares start protonix 40 mg Take 30-60 min before first meal of the day until flare is over for a week   Take delsym two tsp every 12 hours and supplement if needed with  tramadol 50 mg up to 1-2 every 4 hours to suppress the urge to cough. Swallowing water or using ice chips/non mint and menthol containing candies (such as lifesavers or sugarless jolly ranchers) are also effective.  You should rest your voice and avoid activities that you know make you cough.  Once you have eliminated the cough for 3 straight days try reducing the tramadol first,  then the delsym as tolerated.    If not improving > Prednisone 10 mg take  4 each am x 2 days,   2 each am x 2 days,  1 each am x 2 days and stop   GERD (REFLUX)  is an extremely common cause of respiratory symptoms just like yours , many times with no obvious heartburn at all.    It can be treated with medication, but also with lifestyle changes including elevation of the head of your bed (ideally with 6 inch  bed blocks),  Smoking cessation, avoidance of late meals, excessive alcohol, and avoid fatty foods, chocolate, peppermint, colas, red wine, and acidic juices such as orange juice.  NO MINT OR MENTHOL PRODUCTS SO NO COUGH DROPS  USE SUGARLESS CANDY INSTEAD (Jolley ranchers or Stover's or Life Savers) or even ice chips will also do - the key is to swallow to prevent all throat clearing. NO OIL BASED VITAMINS - use powdered substitutes.

## 2018-10-02 NOTE — Telephone Encounter (Signed)
Patient has already received Ultram recently from another provider and it is not time for it to be refilled. I cannot refill the medication. When he runs out the refill can be sent to Dr. Sherene Sires at that time.

## 2018-10-02 NOTE — Telephone Encounter (Signed)
Called pt and advised message from the provider. Pt understood and verbalized understanding. Nothing further is needed.    

## 2018-10-02 NOTE — Telephone Encounter (Signed)
Review from OV, it does state he was going to use Tramadol every four hours. Tonya please advise on quantity. Thanks.    Assessment & Plan:   Acute upper respiratory infection Will defer antibiotic at this time.  Advised patient to call back if symptoms worsen over the next few days.  Patient Instructions  Whenever having respiratory flares start protonix 40 mg Take 30-60 min before first meal of the day until flare is over for a week   Take delsym two tsp every 12 hours and supplement if neededwith tramadol 50 mgup to 1-2 every 4 hours to suppress the urge to cough. Swallowing water or using ice chips/non mint and menthol containing candies (such as lifesavers or sugarless jolly ranchers) are also effective. You should rest your voice and avoid activities that you know make you cough.  Once you have eliminated the cough for 3 straight daystry reducing the tramadol first, then the delsym as tolerated.   If not improving >Prednisone 10 mg take 4 each am x 2 days, 2 each am x 2 days, 1 each am x 2 days and stop   GERD (REFLUX) is an extremely common cause of respiratory symptomsjust like yours, many times with no obvious heartburn at all.  It can be treated with medication, but also with lifestyle changes including elevation of the head of your bed (ideally with 6 inch bed blocks), Smoking cessation, avoidance of late meals, excessive alcohol, and avoid fatty foods, chocolate, peppermint, colas, red wine, and acidic juices such as orange juice.  NO MINT OR MENTHOL PRODUCTS SO NO COUGH DROPS  USE SUGARLESS CANDY INSTEAD (Jolley ranchers or Stover's or Life Savers) or even ice chips will also do - the key is to swallow to prevent all throat clearing. NO OIL BASED VITAMINS - use powdered substitutes.

## 2018-10-02 NOTE — Patient Instructions (Signed)
Whenever having respiratory flares start protonix 40 mg Take 30-60 min before first meal of the day until flare is over for a week   Take delsym two tsp every 12 hours and supplement if needed with  tramadol 50 mg up to 1-2 every 4 hours to suppress the urge to cough. Swallowing water or using ice chips/non mint and menthol containing candies (such as lifesavers or sugarless jolly ranchers) are also effective.  You should rest your voice and avoid activities that you know make you cough.  Once you have eliminated the cough for 3 straight days try reducing the tramadol first,  then the delsym as tolerated.    If not improving > Prednisone 10 mg take  4 each am x 2 days,   2 each am x 2 days,  1 each am x 2 days and stop   GERD (REFLUX)  is an extremely common cause of respiratory symptoms just like yours , many times with no obvious heartburn at all.    It can be treated with medication, but also with lifestyle changes including elevation of the head of your bed (ideally with 6 inch  bed blocks),  Smoking cessation, avoidance of late meals, excessive alcohol, and avoid fatty foods, chocolate, peppermint, colas, red wine, and acidic juices such as orange juice.  NO MINT OR MENTHOL PRODUCTS SO NO COUGH DROPS  USE SUGARLESS CANDY INSTEAD (Jolley ranchers or Stover's or Life Savers) or even ice chips will also do - the key is to swallow to prevent all throat clearing. NO OIL BASED VITAMINS - use powdered substitutes.

## 2018-10-02 NOTE — Progress Notes (Signed)
 @Patient  ID: Charles GeraldsJohn P Villarreal, male    DOB: 09/12/1965, 53 y.o.   MRN: [id number]  Chief Complaint  Patient presents with  . Acute Visit    Has a history of sarcoidosis. Had a cough for 3 days. Was advised by MW if he has a cough, to come in to be seen. Denies any fever or body aches.     Referring provider: Margaree MackintoshBaxley, Mary J, MD  HPI 53 year old male with chronic rhinitis history of sarcoidosis followed by Dr. Sherene SiresWert.  Recent Significant events:  Last visit with Dr. Sherene SiresWert 04/08/18: 1. Whenever having respiratory flares start protonix 40 mg Take 30-60 min before first meal of the day until flare is over for a week (compliant) 2.Zpak (completed) 3. Take delsym two tsp every 12 hours and supplement if needed with  tramadol 50 mg up to 1-2 every 4 hours to suppress the urge to cough. Swallowing water or using ice chips/non mint and menthol containing candies (such as lifesavers or sugarless jolly ranchers) are also effective.  You should rest your voice and avoid activities that you know make you cough.(compliant) 4. Once you have eliminated the cough for 3 straight days try reducing the tramadol first,  then the delsym as tolerated.  (compliant) 5. If not improving > Prednisone 10 mg take  4 each am x 2 days,   2 each am x 2 days,  1 each am x 2 days and stop (did not have medication to try) 6. GERD (REFLUX)  is an extremely common cause of respiratory symptoms just like yours , many times with no obvious heartburn at all. It can be treated with medication, but also with lifestyle changes including elevation of the head of your bed (ideally with 6 inch  bed blocks),  Smoking cessation, avoidance of late meals, excessive alcohol, and avoid fatty foods, chocolate, peppermint, colas, red wine, and acidic juices such as orange juice. (compliant) 7. NO MINT OR MENTHOL PRODUCTS SO NO COUGH DROPS  USE SUGARLESS CANDY INSTEAD (Jolley ranchers or Stover's or Life Savers) or even ice chips will also do - the key  is to swallow to prevent all throat clearing. (compliant) 8. NO OIL BASED VITAMINS - use powdered substitutes. (compliant)  OV 10/02/18 - acute cough Patient presents today with cough.  He states that his cough started 3 days ago (09/29/18).  His last seen by Dr. Sherene SiresWert on 04/08/2018.  States that his cough did clear up after that visit.  He states that his current cough is nonproductive.  He denies any fever.  He has been compliant with recommendations from last visit other than taking prednisone which he did not have.  Nuys any fever.  He denies any shortness of breath, chest pain, or edema.    No Known Allergies  Immunization History  Administered Date(s) Administered  . Influenza Split 06/07/2012, 06/11/2013  . Influenza Whole 08/28/2009  . Pneumococcal Polysaccharide-23 04/05/2010  . Tdap 06/07/2012    Past Medical History:  Diagnosis Date  . Complication of anesthesia    aggression  . Depression   . GERD (gastroesophageal reflux disease)   . Gout   . HSV-1 (herpes simplex virus 1) infection   . Hyperlipidemia   . Hypertension   . Low back pain   . Sarcoidosis   . Tremor     Tobacco History: Social History   Tobacco Use  Smoking Status Never Smoker  Smokeless Tobacco Former NeurosurgeonUser  . Types: Chew   Counseling given: Not  Answered   Outpatient Encounter Medications as of 10/02/2018  Medication Sig  . Ascorbic Acid (VITAMIN C) 1000 MG tablet Take 1,000 mg by mouth daily.  Marland Kitchen aspirin EC 81 MG tablet Take 81 mg by mouth daily.  Marland Kitchen BEE POLLEN PO Take 1 Scoop by mouth daily.  Marland Kitchen ibuprofen (ADVIL,MOTRIN) 800 MG tablet Take 800 mg by mouth every 8 (eight) hours as needed.  . Melatonin 5 MG TABS Take 5 mg by mouth at bedtime.  . pantoprazole (PROTONIX) 40 MG tablet TAKE ONE TABLET DAILY 30-60 MINUTES BEFORE FIRST MEAL OF THE DAY  . primidone (MYSOLINE) 50 MG tablet Take 1 tablet (50 mg total) by mouth at bedtime. (Patient taking differently: Take 50 mg by mouth daily. )  .  propranolol ER (INDERAL LA) 120 MG 24 hr capsule Take 1 capsule (120 mg total) by mouth daily.  . predniSONE (DELTASONE) 10 MG tablet Take 4 tabs for 2 days, then 3 tabs for 2 days, then 2 tabs for 2 days, then 1 tab for 2 days, then stop   No facility-administered encounter medications on file as of 10/02/2018.      Review of Systems  Review of Systems  Constitutional: Negative.  Negative for chills and fever.  HENT: Negative.   Respiratory: Positive for cough. Negative for shortness of breath and wheezing.   Cardiovascular: Negative.  Negative for chest pain, palpitations and leg swelling.  Gastrointestinal: Negative.   Allergic/Immunologic: Negative.   Neurological: Negative.   Psychiatric/Behavioral: Negative.        Physical Exam  BP 116/72 (BP Location: Left Arm, Patient Position: Sitting, Cuff Size: Normal)   Pulse (!) 38   Ht 6\' 1"  (1.854 m)   Wt 211 lb 9.6 oz (96 kg)   SpO2 98%   BMI 27.92 kg/m   Wt Readings from Last 5 Encounters:  10/02/18 211 lb 9.6 oz (96 kg)  04/30/18 208 lb (94.3 kg)  04/08/18 209 lb (94.8 kg)  02/02/18 210 lb (95.3 kg)  11/02/17 208 lb (94.3 kg)     Physical Exam Vitals signs and nursing note reviewed.  Constitutional:      General: He is not in acute distress.    Appearance: He is well-developed.  Cardiovascular:     Rate and Rhythm: Normal rate and regular rhythm.  Pulmonary:     Effort: Pulmonary effort is normal. No respiratory distress.     Breath sounds: Normal breath sounds. No wheezing or rhonchi.  Skin:    General: Skin is warm and dry.  Neurological:     Mental Status: He is alert and oriented to person, place, and time.      Assessment & Plan:   Acute upper respiratory infection Will defer antibiotic at this time.  Advised patient to call back if symptoms worsen over the next few days.  Patient Instructions  Whenever having respiratory flares start protonix 40 mg Take 30-60 min before first meal of the day  until flare is over for a week   Take delsym two tsp every 12 hours and supplement if needed with  tramadol 50 mg up to 1-2 every 4 hours to suppress the urge to cough. Swallowing water or using ice chips/non mint and menthol containing candies (such as lifesavers or sugarless jolly ranchers) are also effective.  You should rest your voice and avoid activities that you know make you cough.  Once you have eliminated the cough for 3 straight days try reducing the tramadol first,  then  the delsym as tolerated.    If not improving > Prednisone 10 mg take  4 each am x 2 days,   2 each am x 2 days,  1 each am x 2 days and stop   GERD (REFLUX)  is an extremely common cause of respiratory symptoms just like yours , many times with no obvious heartburn at all.    It can be treated with medication, but also with lifestyle changes including elevation of the head of your bed (ideally with 6 inch  bed blocks),  Smoking cessation, avoidance of late meals, excessive alcohol, and avoid fatty foods, chocolate, peppermint, colas, red wine, and acidic juices such as orange juice.  NO MINT OR MENTHOL PRODUCTS SO NO COUGH DROPS  USE SUGARLESS CANDY INSTEAD (Jolley ranchers or Stover's or Life Savers) or even ice chips will also do - the key is to swallow to prevent all throat clearing. NO OIL BASED VITAMINS - use powdered substitutes.        Ivonne Andrew, NP 10/02/2018

## 2018-10-03 NOTE — Progress Notes (Signed)
Chart and office note reviewed in detail  > agree with a/p as outlined    

## 2018-10-04 DIAGNOSIS — M545 Low back pain: Secondary | ICD-10-CM | POA: Diagnosis not present

## 2018-10-04 DIAGNOSIS — M546 Pain in thoracic spine: Secondary | ICD-10-CM | POA: Diagnosis not present

## 2018-10-08 DIAGNOSIS — F331 Major depressive disorder, recurrent, moderate: Secondary | ICD-10-CM | POA: Diagnosis not present

## 2018-10-08 NOTE — Progress Notes (Signed)
Subjective:    Charles Villarreal was seen in consultation in the movement disorder clinic at the request of Dr. Eden Villarreal.  His PCP is [name], [name], MD.  The evaluation is for tremor.  [facility].  I reviewed those records via [facility] Everywhere.   The pt states that the first sx started about 2 years ago and it was an acute episode of loss of balance and dizziness.  [place] [employer] [place] [place]  He was dizzy and off balance for about 2 hours.  Admittedly, it was very hot outside.  Not long thereafter, he would note intermittent trouble writing because of tremor.  He would note tremor with other activites as well, such as with drinking out of a cup or using the arm.  This has gotten better with diet and lifestyle modifications (less caffeine, less alcohol, more exercise, adding propranolol).  The tremor can be R or L and is independent of activity.  He was started on propranolol by Dr. Lenord Villarreal for the tremor and BP.  [facility] and had wore [facility] BP monitor.  His father and grandfather had hx of tremor.  He does not know if his grandfather ever got a dx and his father is still living, but is undiagnosed as far as he knows with any particular tremor type.  He was on prednisone around Christmas for flu (has hx of pulmonary sarcoid) and that didn't seem to make tremor worse.   Affected by caffeine:  yes, but decreased caffeine to one diet coke per day and one cup per day Affected by alcohol:  yes Affected by stress:  yes Affected by fatigue:  no (does seem worse around 1:30-2 pm) Spills soup if on spoon:  yes (intermittent) Spills glass of liquid if full:  no Affects ADL's (tying shoes, brushing teeth, etc):  no  03/05/14 update:  The patient was seen back in followup today.  He has had several tests in regards to his tremor, but also in regards to the fact that he had some concern in features on his physical exam or worrisome for a possible parkinsonian states,  including loss of balance, dizziness and elevated tone on the right.  Fortunately, his DaT scan done since last visit was unremarkable.  Unfortunately, his insurance does not pay for the scan.  I have written an appeal letter as this was completely within indications.  He did have other tests done since last visit.  His 24-hour urine for metanephrine test negative.  His 2 hour glucose tolerance test was unremarkable.  B12 was mildly low at 381.  He had plasma catecholamines drawn.  Norepinephrine was normal, as is dopamine.  Epinephrine was slightly elevated, but I think that this was just incidental.  Pt reports that clinically he is feeling much better.  Dizziness is virtually gone.  He only has this 1-2 times per month and it was daily.  He is also exercising every single day in the morning now, which has helped.  He thinks that the vitamin B12 and vitamin C have given him some energy, along with the exercises.  While his blood pressure was somewhat elevated today (states that he was almost late to get here), his blood pressure has [name] control.  He has gotten a fit bit and has become somewhat competitive with his family regarding this.  This has turned out to be a positive thing.  02/24/15 update:  The  patient returns today for follow-up.  Overall, the patient states that he has done well over the year, until very recently.  He ran out of the medication and now has noticed an increase in tremor.  Once he made a follow-up here, he was given a refill on the medication and restarted it on Monday so he has only been back on it for 2 days.  His tremor is improving but he still is noticing more than he originally was.  Otherwise, the patient states that he is doing well and has no new medical problems.  He does state that he is exercising more.  04/20/16 update:  The patient is following up today.  I have not seen him in over a year.  He called me not long after our last visit to state that his tremor is  increasing and we increase his Inderal LA total of 120 mg daily.  He states that tremor was well controlled until he ran out of it 4 days ago.  He is therefore tremulous today.    10/31/16 update:  Pt f/u today, earlier than expected.  Wife accompanies him and supplements the history.    On Inderal LA 120 mg daily.    States that he no longer really has periods of normalcy and has more times of tremor.  Hurt his back and can't exercise as much and thinks that this contributes but isn't only factor.  Hands and legs.  More with use of hands.  Trouble staying asleep as well; thinks that tremor awakens him.  No new medications.  Was able to back off on chol med because diet is better.  Feels that balance has not been as good.  No falls.  12/26/16 update:  Pt seen today in f/u for tremor.  Patient is currently on primidone, 50 mg daily which was added last visit to his Inderal LA, 120 mg daily.  States that primidone did help but it caused sleep trouble.  Pt states that he went fishing this weekend and he hurt his back and went to a PA this weekend and got some prednisone yesterday.  This hasn't worsened his tremor yet.  On flexeril right now for his back.    05/03/17 update:  Patient seen today in follow-up for essential tremor.  Patient is on Inderal LA, 120 mg daily.  He is also on primidone, 50 mg daily.  The patient reports that "its so much better than it was but there is a little tremor lately."  He is able to write for himself lately.  The addition of primidone really helped.     Last time, he told me he had injured his back while fishing on the boat.  Turns out that he sustained a fracture of the back.  He had seen Dr. Dutch Villarreal.  It is slowly getting better.  He only uses ultram when he exercises.    09/25/17 update: Patient is seen today in follow-up for essential tremor.  He is seen earlier than scheduled. This patient is accompanied in the office by his wife who supplements the history.   He is on Inderal LA,  120 mg daily.  He is also on primidone, 50 mg daily.  The patient states that on Sunday he woke up and had severe back pain (not unusual) but when he went to stand up he was very off balance and somewhat dizzy.  Looking back, he didn't go to work the previous thurs/friday due to lack of balance.  When asked, he stated that the lack of balance was much worse than dizziness and he does not describe any vertigo.  He also noted some tremor in the legs.  He started on valium for his back in the beginning of Jan and he didn't note that it was affecting balance but wife noted that it was affecting him cognitively.  Pt does think that it was causing a hang over effect.  His balance is slowing getting better since Sunday.  He does feel a little weak in proximal arms and legs and they feel sore, as if he was working out.  He has been on lipitor for years.  He has no lateralizing weakness/paresthesias.  Has MRI on back later today at neurosx office.  Also had emesis x 2 days at dinner time.  On tramadol but doesn't think that emesis associated with that.    11/02/17 update: Patient is seen today in follow-up. He is accompanied by his wife who supplements the history.   Patient had a T12 corpectomy with T11-L1 anterior lateral fusion.  This was completed on October 09, 2017.  He was discharged October 12, 2017 but returned to the emergency room on October 26, 2017.  He had been switched to [facility] from OxyContin and Valium and became more somnolent and confused.  CT of the head was completed and was negative.  This was felt likely due to medication effect.  He is only on tramadol now. He reports pain is better than before surgery but he still is in pain.   He has a f/u on 3/27.  Only walking for exercise.  In regards to tremor, the patient remains on Inderal LA, 120 mg daily and primidone 50 mg daily.  Tremor has been stable and even some better.  He is off of several BP meds.  States that he used to drink and no longer drinks  EtOH.  He also lost weight and feels that this has helped BP.    04/30/18 update:  Pt seen in f/u for tremor.  On inderal LA, 120 mg daily and primidone 50 mg daily.  Records are reviewed since last visit.  The patient went to the emergency room on February 03, 2018 due to mental status change, which turned out to be due to accidental drug overdose (Valium).  His alcohol level was also elevated and it was felt that the combination of alcohol and Valium caused the mental status change.  He was seen by behavioral health counselor.  Those notes are reviewed.  Noted that the patient had a long history of alcohol abuse, but was sober from 2007-2012.  He then relapsed.  He had since been drinking 3-5 fifths of alcohol per week and on average he was drinking 4 days/week.  Been sober for 60 days. Going to AA and seeing a Veterinary surgeon.  Feels great.  States that huge difference in tremor now that not drinking.  Still has tremor but not like was.  Knows that he used to drink some to control tremor.   10/16/18 update: Patient seen today in follow-up for essential tremor.  He is on Inderal LA, 120 mg daily.  He is also on primidone, 50 mg daily - he ran out 2 days ago and has not noticed a big difference.  Patient reports that tremor has been really good.  Medical records have been reviewed since last visit.  He was seen by pulmonary for cough.  Antibiotics were not felt necessary.  It is noted  that the patient called for Ultram, but was denied because he had received it from other sources.  [facility] database was reviewed and the patient has been receiving frequent Ultram from neurosurgery.  #60 Ultram have been refilled November 20, November 29, and December 18, December 26, January 29, February 5.  Reports that his back pain was consistent, but it is actually getting better with dry needling.  Reports he is no longer drinking EtOH.     Outside reports reviewed: historical medical records.  No Known Allergies  Current  Outpatient Medications on File Prior to Visit  Medication Sig Dispense Refill  . Ascorbic Acid (VITAMIN C) 1000 MG tablet Take 1,000 mg by mouth daily.    Marland Kitchen aspirin EC 81 MG tablet Take 81 mg by mouth daily.    Marland Kitchen BEE POLLEN PO Take 1 Scoop by mouth daily.    Marland Kitchen ibuprofen (ADVIL,MOTRIN) 800 MG tablet Take 800 mg by mouth every 8 (eight) hours as needed.    . Melatonin 5 MG TABS Take 5 mg by mouth at bedtime.    . pantoprazole (PROTONIX) 40 MG tablet TAKE ONE TABLET DAILY 30-60 MINUTES BEFORE FIRST MEAL OF THE DAY 30 tablet 2  . primidone (MYSOLINE) 50 MG tablet Take 1 tablet (50 mg total) by mouth at bedtime. (Patient taking differently: Take 50 mg by mouth daily. ) 90 tablet 2  . propranolol ER (INDERAL LA) 120 MG 24 hr capsule Take 1 capsule (120 mg total) by mouth daily. 90 capsule 3   No current facility-administered medications on file prior to visit.     Past Medical History:  Diagnosis Date  . Complication of anesthesia    aggression  . Depression   . GERD (gastroesophageal reflux disease)   . Gout   . HSV-1 (herpes simplex virus 1) infection   . Hyperlipidemia   . Hypertension   . Low back pain   . Sarcoidosis   . Tremor     Past Surgical History:  Procedure Laterality Date  . ANTERIOR LAT LUMBAR FUSION N/A 10/09/2017   Procedure: Thoracic twelve Corpectomy, Thoracic eleven-Lumbar one  lateral plate,   Anterior Lateral Interbody Fusion with Corpectomy Cage ;  Surgeon: Julio Sicks, MD;  Location: [facility] OR;  Service: Neurosurgery;  Laterality: N/A;  . APPENDECTOMY    . KNEE ARTHROPLASTY  1995   Lt  . UMBILICAL HERNIA REPAIR      Social History   Socioeconomic History  . Marital status: Married    Spouse name: Not on file  . Number of children: Not on file  . Years of education: Not on file  . Highest education level: Not on file  Occupational History  . Not on file  Social Needs  . Financial resource strain: Not on file  . Food insecurity:    Worry: Not on file     Inability: Not on file  . Transportation needs:    Medical: Not on file    Non-medical: Not on file  Tobacco Use  . Smoking status: Never Smoker  . Smokeless tobacco: Former Neurosurgeon    Types: Chew  Substance and Sexual Activity  . Alcohol use: Not Currently    Comment: quit drinking June 2019  . Drug use: No  . Sexual activity: Not on file  Lifestyle  . Physical activity:    Days per week: Not on file    Minutes per session: Not on file  . Stress: Not on file  Relationships  .  Social connections:    Talks on phone: Not on file    Gets together: Not on file    Attends religious service: Not on file    Active member of club or organization: Not on file    Attends meetings of clubs or organizations: Not on file    Relationship status: Not on file  . Intimate partner violence:    Fear of current or ex partner: Not on file    Emotionally abused: Not on file    Physically abused: Not on file    Forced sexual activity: Not on file  Other Topics Concern  . Not on file  Social History Narrative  . Not on file    Family Status  Relation Name Status  . Father  Alive  . Sister  Alive  . Mother  Alive  . Other Grandfather Deceased     Review of Systems A complete 10 system ROS was obtained and was negative apart from what is mentioned.   Objective:   VITALS:   Vitals:   10/16/18 1105  BP: 124/72  Pulse: 60  SpO2: 97%  Weight: 210 lb (95.3 kg)  Height: 6\' 1"  (1.854 m)   Wt Readings from Last 3 Encounters:  10/16/18 210 lb (95.3 kg)  10/02/18 211 lb 9.6 oz (96 kg)  04/30/18 208 lb (94.3 kg)   GEN:  The patient appears stated age and is in NAD. HEENT:  Normocephalic, atraumatic.  The mucous membranes are moist. The superficial temporal arteries are without ropiness or tenderness. CV:  RRR Lungs:  CTAB Neck/HEME:  There are no carotid bruits bilaterally.  Neurological examination:  Orientation: The patient is alert and oriented x3. Cranial nerves: There is good  facial symmetry. The speech is fluent and clear. Soft palate rises symmetrically and there is no tongue deviation. Hearing is intact to conversational tone. Sensation: Sensation is intact to light touch throughout Motor: Strength is 5/5 in the bilateral upper and lower extremities.   Shoulder shrug is equal and symmetric.  There is no pronator drift.  Movement examination: Tone: There is normal tone in the UE/LE Abnormal movements: there is very mild tremor of the outstretched hands.  He has mild trouble with Archimedes spirals, right more than left. Coordination:  There is no decremation with RAM's, with any form of RAMS, including alternating supination and pronation of the forearm, hand opening and closing, finger taps, heel taps and toe taps. Gait and Station: The patient has no difficulty arising out of a deep-seated chair without the use of the hands. The patient's stride length is normal.     LABS  Lab Results  Component Value Date   HGBA1C 5.4 08/05/2015   Lab Results  Component Value Date   VITAMINB12 561 09/25/2017   Lab Results  Component Value Date   TSH 2.21 09/25/2017     Chemistry      Component Value Date/Time   NA 138 02/03/2018 0046   K 4.3 02/03/2018 0046   CL 106 02/03/2018 0046   CO2 20 (L) 02/03/2018 0046   BUN 14 02/03/2018 0046   CREATININE 0.83 02/03/2018 0046   CREATININE 0.97 09/25/2017 0951      Component Value Date/Time   CALCIUM 8.9 02/03/2018 0046   ALKPHOS 94 02/03/2018 0046   AST 33 02/03/2018 0046   ALT 25 02/03/2018 0046   BILITOT 0.4 02/03/2018 0046         Assessment/Plan:   1.  Essential Tremor.   -  His DAT scan was negative years ago.    -Tremor has been better and easier to control since sober.  -continue Inderal LA 120 mg.   - stay off of primidone.  Been off of it for 2 days and tremor not significantly worse. 2.  Low back pain  -Patient receiving very frequent Ultram, often going through 60 tablets every 8 or 9 days, via  [employer].  I do not, however, have neurosurgery records and will defer any treatments to neurosurgery.  However, given history of alcohol abuse, I am somewhat worried about frequency of use of Ultram.  He does tell me that he is doing better with dry needling and having less pain. 3.  Follow-up 9 months to 1 year.

## 2018-10-09 DIAGNOSIS — M545 Low back pain: Secondary | ICD-10-CM | POA: Diagnosis not present

## 2018-10-09 DIAGNOSIS — M546 Pain in thoracic spine: Secondary | ICD-10-CM | POA: Diagnosis not present

## 2018-10-15 DIAGNOSIS — F331 Major depressive disorder, recurrent, moderate: Secondary | ICD-10-CM | POA: Diagnosis not present

## 2018-10-16 ENCOUNTER — Encounter: Payer: Self-pay | Admitting: Neurology

## 2018-10-16 ENCOUNTER — Ambulatory Visit (INDEPENDENT_AMBULATORY_CARE_PROVIDER_SITE_OTHER): Payer: BLUE CROSS/BLUE SHIELD | Admitting: Neurology

## 2018-10-16 VITALS — BP 124/72 | HR 60 | Ht 73.0 in | Wt 210.0 lb

## 2018-10-16 DIAGNOSIS — G250 Essential tremor: Secondary | ICD-10-CM

## 2018-10-18 DIAGNOSIS — M546 Pain in thoracic spine: Secondary | ICD-10-CM | POA: Diagnosis not present

## 2018-10-18 DIAGNOSIS — M545 Low back pain: Secondary | ICD-10-CM | POA: Diagnosis not present

## 2018-10-29 DIAGNOSIS — M545 Low back pain: Secondary | ICD-10-CM | POA: Diagnosis not present

## 2018-10-29 DIAGNOSIS — M546 Pain in thoracic spine: Secondary | ICD-10-CM | POA: Diagnosis not present

## 2018-11-01 DIAGNOSIS — M546 Pain in thoracic spine: Secondary | ICD-10-CM | POA: Diagnosis not present

## 2018-11-01 DIAGNOSIS — M545 Low back pain: Secondary | ICD-10-CM | POA: Diagnosis not present

## 2018-11-04 ENCOUNTER — Other Ambulatory Visit: Payer: Self-pay | Admitting: Neurology

## 2018-11-07 DIAGNOSIS — S22080A Wedge compression fracture of T11-T12 vertebra, initial encounter for closed fracture: Secondary | ICD-10-CM | POA: Diagnosis not present

## 2018-11-08 DIAGNOSIS — M545 Low back pain: Secondary | ICD-10-CM | POA: Diagnosis not present

## 2018-11-08 DIAGNOSIS — M546 Pain in thoracic spine: Secondary | ICD-10-CM | POA: Diagnosis not present

## 2018-12-05 ENCOUNTER — Emergency Department (HOSPITAL_COMMUNITY)
Admission: EM | Admit: 2018-12-05 | Discharge: 2018-12-05 | Disposition: A | Discharge status: Home / Self Care | Payer: BLUE CROSS/BLUE SHIELD | Attending: Emergency Medicine | Admitting: Emergency Medicine

## 2018-12-05 ENCOUNTER — Encounter (HOSPITAL_COMMUNITY): Payer: Self-pay | Admitting: Emergency Medicine

## 2018-12-05 DIAGNOSIS — Z7982 Long term (current) use of aspirin: Secondary | ICD-10-CM | POA: Diagnosis not present

## 2018-12-05 DIAGNOSIS — Z79899 Other long term (current) drug therapy: Secondary | ICD-10-CM | POA: Insufficient documentation

## 2018-12-05 DIAGNOSIS — R001 Bradycardia, unspecified: Secondary | ICD-10-CM | POA: Diagnosis not present

## 2018-12-05 DIAGNOSIS — E785 Hyperlipidemia, unspecified: Secondary | ICD-10-CM | POA: Diagnosis not present

## 2018-12-05 DIAGNOSIS — I10 Essential (primary) hypertension: Secondary | ICD-10-CM | POA: Insufficient documentation

## 2018-12-05 LAB — CBC WITH DIFFERENTIAL/PLATELET
Abs Immature Granulocytes: 0.02 10*3/uL (ref 0.00–0.07)
Basophils Absolute: 0.1 10*3/uL (ref 0.0–0.1)
Basophils Relative: 1 %
Eosinophils Absolute: 0.2 10*3/uL (ref 0.0–0.5)
Eosinophils Relative: 3 %
HCT: 40 % (ref 39.0–52.0)
Hemoglobin: 13.2 g/dL (ref 13.0–17.0)
Immature Granulocytes: 0 %
Lymphocytes Relative: 21 %
Lymphs Abs: 1.2 10*3/uL (ref 0.7–4.0)
MCH: 34.4 pg — ABNORMAL HIGH (ref 26.0–34.0)
MCHC: 33 g/dL (ref 30.0–36.0)
MCV: 104.2 fL — ABNORMAL HIGH (ref 80.0–100.0)
Monocytes Absolute: 0.8 10*3/uL (ref 0.1–1.0)
Monocytes Relative: 14 %
Neutro Abs: 3.5 10*3/uL (ref 1.7–7.7)
Neutrophils Relative %: 61 %
Platelets: UNDETERMINED 10*3/uL (ref 150–400)
RBC: 3.84 MIL/uL — ABNORMAL LOW (ref 4.22–5.81)
RDW: 15.1 % (ref 11.5–15.5)
WBC: 5.7 10*3/uL (ref 4.0–10.5)
nRBC: 0 % (ref 0.0–0.2)

## 2018-12-05 LAB — BASIC METABOLIC PANEL
Anion gap: 9 (ref 5–15)
BUN: 12 mg/dL (ref 6–20)
CO2: 23 mmol/L (ref 22–32)
Calcium: 8.8 mg/dL — ABNORMAL LOW (ref 8.9–10.3)
Chloride: 109 mmol/L (ref 98–111)
Creatinine, Ser: 0.84 mg/dL (ref 0.61–1.24)
GFR calc Af Amer: >60 mL/min (ref >60)
GFR calc non Af Amer: >60 mL/min (ref >60)
Glucose, Bld: 100 mg/dL — ABNORMAL HIGH (ref 70–99)
Potassium: 3.7 mmol/L (ref 3.5–5.1)
Sodium: 141 mmol/L (ref 135–145)

## 2018-12-05 LAB — MAGNESIUM: Magnesium: 1.9 mg/dL (ref 1.7–2.4)
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
## 2018-12-05 NOTE — ED Notes (Signed)
Patient verbalized understanding of dc instructions, vss, ambulatory with nad.   

## 2018-12-05 NOTE — Discharge Instructions (Signed)
You were evaluated in the emergency department for a slow heart rate.  Your heart rate is remained in the 60s the entire time you have been here.  You were having no symptoms.  We checked your hemoglobin and electrolytes and they were fine.  This is likely related to your propranolol and you will need to have the facility continue to check your vital signs and hold your medication if your heart rate slow.  Please return to the emergency department if any concerns.

## 2018-12-05 NOTE — ED Notes (Addendum)
Greg from [facility] called and advised that patient has been cleared by their Doctor to return to the facility and a driver will be come to pick him up soon. Labs and copy of ekg sent with patient per [facility] request so that MD there has a copy of patient's test for his record

## 2018-12-05 NOTE — ED Notes (Signed)
This RN spoke with Tammy Sours at [facility] in regards to patient being discharged back to them. Tammy Sours advised that he would speak with the doctor at [facility] and get approval for the patient to return based on his reassuring work up here in the ED.

## 2018-12-05 NOTE — ED Triage Notes (Addendum)
[facility] where he is receiving detox from ETOH. Patient currently takes propranolol for essential tremor, he was found to have a pulse of 31 BPM this morning while having his BP checked, however, ekg done at facility at with EMS showed NRS with rate around 60 bpm. Patient denies chest pain, sob, fatigue, or palpitations. He reports that he had a low heart rate at one point about a year ago after a back surgery that he had. Pt a/ox4, resp e/u, nad.

## 2018-12-19 ENCOUNTER — Telehealth: Payer: Self-pay | Admitting: Internal Medicine

## 2018-12-19 NOTE — Telephone Encounter (Signed)
LVM to CB and reschedule CPE & Labs °

## 2019-01-06 NOTE — Telephone Encounter (Signed)
PT CB and scheduled CPE and labs

## 2019-01-07 ENCOUNTER — Encounter: Payer: Self-pay | Admitting: Internal Medicine

## 2019-01-07 ENCOUNTER — Other Ambulatory Visit: Payer: Self-pay

## 2019-01-07 ENCOUNTER — Ambulatory Visit (INDEPENDENT_AMBULATORY_CARE_PROVIDER_SITE_OTHER): Payer: BLUE CROSS/BLUE SHIELD | Admitting: Internal Medicine

## 2019-01-07 VITALS — BP 140/80 | HR 69 | Temp 98.4°F | Ht 73.0 in | Wt 208.0 lb

## 2019-01-07 DIAGNOSIS — I10 Essential (primary) hypertension: Secondary | ICD-10-CM

## 2019-01-07 DIAGNOSIS — F419 Anxiety disorder, unspecified: Secondary | ICD-10-CM

## 2019-01-07 DIAGNOSIS — D860 Sarcoidosis of lung: Secondary | ICD-10-CM

## 2019-01-07 DIAGNOSIS — M546 Pain in thoracic spine: Secondary | ICD-10-CM | POA: Diagnosis not present

## 2019-01-07 DIAGNOSIS — R740 Nonspecific elevation of levels of transaminase and lactic acid dehydrogenase [LDH]: Secondary | ICD-10-CM

## 2019-01-07 DIAGNOSIS — E782 Mixed hyperlipidemia: Secondary | ICD-10-CM | POA: Diagnosis not present

## 2019-01-07 DIAGNOSIS — F1021 Alcohol dependence, in remission: Secondary | ICD-10-CM

## 2019-01-07 DIAGNOSIS — K219 Gastro-esophageal reflux disease without esophagitis: Secondary | ICD-10-CM

## 2019-01-07 DIAGNOSIS — G8929 Other chronic pain: Secondary | ICD-10-CM

## 2019-01-07 DIAGNOSIS — G250 Essential tremor: Secondary | ICD-10-CM

## 2019-01-07 DIAGNOSIS — F32A Depression, unspecified: Secondary | ICD-10-CM

## 2019-01-07 DIAGNOSIS — S22081A Stable burst fracture of T11-T12 vertebra, initial encounter for closed fracture: Secondary | ICD-10-CM

## 2019-01-07 DIAGNOSIS — F439 Reaction to severe stress, unspecified: Secondary | ICD-10-CM

## 2019-01-07 DIAGNOSIS — R7401 Elevation of levels of liver transaminase levels: Secondary | ICD-10-CM

## 2019-01-07 DIAGNOSIS — F329 Major depressive disorder, single episode, unspecified: Secondary | ICD-10-CM

## 2019-01-07 DIAGNOSIS — D7589 Other specified diseases of blood and blood-forming organs: Secondary | ICD-10-CM

## 2019-01-07 LAB — POCT URINALYSIS DIPSTICK
Appearance: NEGATIVE
Bilirubin, UA: NEGATIVE
Blood, UA: NEGATIVE
Glucose, UA: NEGATIVE
Ketones, UA: NEGATIVE
Leukocytes, UA: NEGATIVE
Nitrite, UA: NEGATIVE
Odor: NEGATIVE
Protein, UA: POSITIVE — AB
Spec Grav, UA: 1.01 (ref 1.010–1.025)
Urobilinogen, UA: 0.2 E.U./dL
pH, UA: 6 (ref 5.0–8.0)

## 2019-01-09 DIAGNOSIS — S22080A Wedge compression fracture of T11-T12 vertebra, initial encounter for closed fracture: Secondary | ICD-10-CM | POA: Diagnosis not present

## 2019-01-19 ENCOUNTER — Encounter: Payer: Self-pay | Admitting: Internal Medicine
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
## 2019-01-19 NOTE — Patient Instructions (Signed)
It was a pleasure to see you today.  We will need follow-up on blood pressure and abnormal labs in the next 4 to 6 weeks.

## 2019-01-23 ENCOUNTER — Other Ambulatory Visit: Payer: Self-pay

## 2019-01-23 DIAGNOSIS — I10 Essential (primary) hypertension: Secondary | ICD-10-CM

## 2019-01-23 DIAGNOSIS — E782 Mixed hyperlipidemia: Secondary | ICD-10-CM

## 2019-01-23 DIAGNOSIS — R7401 Elevation of levels of liver transaminase levels: Secondary | ICD-10-CM

## 2019-01-23 DIAGNOSIS — Z125 Encounter for screening for malignant neoplasm of prostate: Secondary | ICD-10-CM

## 2019-01-23 DIAGNOSIS — D7589 Other specified diseases of blood and blood-forming organs: Secondary | ICD-10-CM

## 2019-01-23 DIAGNOSIS — E876 Hypokalemia: Secondary | ICD-10-CM

## 2019-02-07 ENCOUNTER — Ambulatory Visit (INDEPENDENT_AMBULATORY_CARE_PROVIDER_SITE_OTHER): Payer: BC Managed Care – PPO | Admitting: Internal Medicine

## 2019-02-07 ENCOUNTER — Encounter: Payer: Self-pay | Admitting: Internal Medicine

## 2019-02-07 ENCOUNTER — Other Ambulatory Visit: Payer: Self-pay

## 2019-02-07 VITALS — BP 110/80 | HR 84 | Ht 73.0 in | Wt 207.0 lb

## 2019-02-07 DIAGNOSIS — F1021 Alcohol dependence, in remission: Secondary | ICD-10-CM

## 2019-02-07 DIAGNOSIS — D7589 Other specified diseases of blood and blood-forming organs: Secondary | ICD-10-CM | POA: Diagnosis not present

## 2019-02-07 DIAGNOSIS — E782 Mixed hyperlipidemia: Secondary | ICD-10-CM | POA: Diagnosis not present

## 2019-02-07 DIAGNOSIS — I10 Essential (primary) hypertension: Secondary | ICD-10-CM

## 2019-02-07 DIAGNOSIS — R740 Nonspecific elevation of levels of transaminase and lactic acid dehydrogenase [LDH]: Secondary | ICD-10-CM | POA: Diagnosis not present
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
## 2019-02-08 LAB — COMPLETE METABOLIC PANEL WITH GFR
AG Ratio: 1.7 (calc) (ref 1.0–2.5)
ALT: 15 U/L (ref 9–46)
AST: 16 U/L (ref 10–35)
Albumin: 4.4 g/dL (ref 3.6–5.1)
Alkaline phosphatase (APISO): 106 U/L (ref 35–144)
BUN: 17 mg/dL (ref 7–25)
CO2: 23 mmol/L (ref 20–32)
Calcium: 9.3 mg/dL (ref 8.6–10.3)
Chloride: 102 mmol/L (ref 98–110)
Creat: 0.98 mg/dL (ref 0.70–1.33)
GFR, Est African American: 101 mL/min/{1.73_m2} (ref >=60)
GFR, Est Non African American: 87 mL/min/{1.73_m2} (ref >=60)
Globulin: 2.6 g/dL (calc) (ref 1.9–3.7)
Glucose, Bld: 87 mg/dL (ref 65–99)
Potassium: 4.7 mmol/L (ref 3.5–5.3)
Sodium: 136 mmol/L (ref 135–146)
Total Bilirubin: 0.4 mg/dL (ref 0.2–1.2)
Total Protein: 7 g/dL (ref 6.1–8.1)

## 2019-02-08 LAB — CBC WITH DIFFERENTIAL/PLATELET
Absolute Monocytes: 533 cells/uL (ref 200–950)
Basophils Absolute: 143 cells/uL (ref 0–200)
Basophils Relative: 1.9 %
Eosinophils Absolute: 1200 cells/uL — ABNORMAL HIGH (ref 15–500)
Eosinophils Relative: 16 %
HCT: 47.4 % (ref 38.5–50.0)
Hemoglobin: 15.8 g/dL (ref 13.2–17.1)
Lymphs Abs: 1763 cells/uL (ref 850–3900)
MCH: 31.2 pg (ref 27.0–33.0)
MCHC: 33.3 g/dL (ref 32.0–36.0)
MCV: 93.7 fL (ref 80.0–100.0)
MPV: 10.1 fL (ref 7.5–12.5)
Monocytes Relative: 7.1 %
Neutro Abs: 3863 cells/uL (ref 1500–7800)
Neutrophils Relative %: 51.5 %
Platelets: 355 10*3/uL (ref 140–400)
RBC: 5.06 10*6/uL (ref 4.20–5.80)
RDW: 11.8 % (ref 11.0–15.0)
Total Lymphocyte: 23.5 %
WBC: 7.5 10*3/uL (ref 3.8–10.8)

## 2019-02-08 LAB — LIPID PANEL
Cholesterol: 214 mg/dL — ABNORMAL HIGH (ref <200)
HDL: 38 mg/dL — ABNORMAL LOW (ref >=40)
LDL Cholesterol (Calc): 145 mg/dL (calc) — ABNORMAL HIGH
Non-HDL Cholesterol (Calc): 176 mg/dL (calc) — ABNORMAL HIGH (ref <130)
Total CHOL/HDL Ratio: 5.6 (calc) — ABNORMAL HIGH (ref <5.0)
Triglycerides: 171 mg/dL — ABNORMAL HIGH (ref <150)

## 2019-02-08 LAB — VITAMIN B12: Vitamin B-12: 288 pg/mL (ref 200–1100)

## 2019-02-08 LAB — PSA: PSA: 2.2 ng/mL (ref <=4.0)

## 2019-02-08 LAB — FOLATE: Folate: 3.1 ng/mL — ABNORMAL LOW

## 2019-02-18 ENCOUNTER — Telehealth: Payer: Self-pay | Admitting: Internal Medicine

## 2019-02-18 MED ORDER — AMLODIPINE BESYLATE 10 MG PO TABS: 10.0000 mg | ORAL_TABLET | Freq: Every day | ORAL | 6 refills | Status: DC

## 2019-02-18 MED ORDER — METHOCARBAMOL 500 MG PO TABS: 500.0000 mg | ORAL_TABLET | Freq: Two times a day (BID) | ORAL | 6 refills | Status: DC

## 2019-02-18 NOTE — Telephone Encounter (Signed)
Medication send to pharmacy  Nothing further needed.

## 2019-02-18 NOTE — Telephone Encounter (Signed)
Refill x 6 months 

## 2019-02-18 NOTE — Telephone Encounter (Signed)
[name] called to say that [name] from the [facility] would like for you to take over prescribing the following prescriptions.  [place], [place] - [address] [phone] (Phone) [phone] (Fax)     Medication - amLODipine (NORVASC) 10 MG tablet  methocarbamol (ROBAXIN) 500 MG tablet    Last Refill  Last OV  -02-07-19  Last CPE - 01-07-19

## 2019-02-20 ENCOUNTER — Telehealth: Payer: Self-pay

## 2019-02-20 NOTE — Telephone Encounter (Signed)
Patient called states [facility] meth[facility] 500mg  2 tabs po BID, and he said he only gave him one tab po bid. He would like to have this medication changed.

## 2019-02-20 NOTE — Telephone Encounter (Signed)
Please change as requested and refill x 90 days

## 2019-02-24 MED ORDER — METHOCARBAMOL 500 MG PO TABS: ORAL_TABLET | ORAL | 5 refills | Status: DC

## 2019-03-15 NOTE — Patient Instructions (Signed)
Continue current medications for hypertension.  Robaxin twice daily will be refilled.  Continue Cymbalta and ibuprofen.  Follow-up here in 6 months.

## 2019-05-12 ENCOUNTER — Other Ambulatory Visit: Payer: Self-pay

## 2019-05-12 ENCOUNTER — Other Ambulatory Visit: Payer: BC Managed Care – PPO | Admitting: Internal Medicine

## 2019-05-12 DIAGNOSIS — E782 Mixed hyperlipidemia: Secondary | ICD-10-CM | POA: Diagnosis not present

## 2019-05-12 DIAGNOSIS — E538 Deficiency of other specified B group vitamins: Secondary | ICD-10-CM

## 2019-05-13 LAB — LIPID PANEL
Cholesterol: 235 mg/dL — ABNORMAL HIGH
HDL: 43 mg/dL
LDL Cholesterol (Calc): 162 mg/dL — ABNORMAL HIGH
Non-HDL Cholesterol (Calc): 192 mg/dL — ABNORMAL HIGH
Total CHOL/HDL Ratio: 5.5 (calc) — ABNORMAL HIGH
Triglycerides: 151 mg/dL — ABNORMAL HIGH

## 2019-05-13 LAB — FOLATE: Folate: >24 ng/mL

## 2019-05-15 ENCOUNTER — Encounter: Payer: Self-pay | Admitting: Internal Medicine

## 2019-05-15 ENCOUNTER — Other Ambulatory Visit: Payer: Self-pay

## 2019-05-15 ENCOUNTER — Ambulatory Visit (INDEPENDENT_AMBULATORY_CARE_PROVIDER_SITE_OTHER): Payer: BC Managed Care – PPO | Admitting: Internal Medicine

## 2019-05-15 VITALS — BP 110/70 | HR 70 | Ht 73.0 in | Wt 207.0 lb

## 2019-05-15 DIAGNOSIS — M546 Pain in thoracic spine: Secondary | ICD-10-CM

## 2019-05-15 DIAGNOSIS — I10 Essential (primary) hypertension: Secondary | ICD-10-CM | POA: Diagnosis not present

## 2019-05-15 DIAGNOSIS — F1021 Alcohol dependence, in remission: Secondary | ICD-10-CM | POA: Diagnosis not present

## 2019-05-15 DIAGNOSIS — E782 Mixed hyperlipidemia: Secondary | ICD-10-CM | POA: Diagnosis not present

## 2019-05-15 DIAGNOSIS — N529 Male erectile dysfunction, unspecified: Secondary | ICD-10-CM

## 2019-05-15 DIAGNOSIS — G8929 Other chronic pain: Secondary | ICD-10-CM

## 2019-05-15 DIAGNOSIS — F419 Anxiety disorder, unspecified: Secondary | ICD-10-CM

## 2019-05-15 MED ORDER — ROSUVASTATIN CALCIUM 5 MG PO TABS: ORAL_TABLET | ORAL | 3 refills | Status: DC

## 2019-05-15 MED ORDER — TADALAFIL 5 MG PO TABS: 5.0000 mg | ORAL_TABLET | Freq: Every day | ORAL | 5 refills | Status: DC | PRN

## 2019-05-15 MED ORDER — IBUPROFEN 800 MG PO TABS: 800.0000 mg | ORAL_TABLET | Freq: Three times a day (TID) | ORAL | 3 refills | Status: DC | PRN
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
## 2019-05-15 NOTE — Patient Instructions (Signed)
Continue folate supplement.  Ibuprofen 800 mg 3 times a day for musculoskeletal pain.  Continue amlodipine and Minipress for hypertension.  Start Crestor 5 mg 3 times a week.  Cialis 5 mg daily.  Return in 3 months for office visit lipid panel liver functions.

## 2019-07-12 DIAGNOSIS — Z20828 Contact with and (suspected) exposure to other viral communicable diseases: Secondary | ICD-10-CM | POA: Diagnosis not present

## 2019-08-12 ENCOUNTER — Other Ambulatory Visit: Payer: BC Managed Care – PPO | Admitting: Internal Medicine

## 2019-08-12 ENCOUNTER — Other Ambulatory Visit: Payer: Self-pay

## 2019-08-12 DIAGNOSIS — E782 Mixed hyperlipidemia: Secondary | ICD-10-CM | POA: Diagnosis not present

## 2019-08-13 LAB — HEPATIC FUNCTION PANEL
AG Ratio: 1.6 (calc) (ref 1.0–2.5)
ALT: 19 U/L (ref 9–46)
AST: 18 U/L (ref 10–35)
Albumin: 4.2 g/dL (ref 3.6–5.1)
Alkaline phosphatase (APISO): 72 U/L (ref 35–144)
Bilirubin, Direct: 0.1 mg/dL (ref 0.0–0.2)
Globulin: 2.6 g/dL (calc) (ref 1.9–3.7)
Indirect Bilirubin: 0.3 mg/dL (calc) (ref 0.2–1.2)
Total Bilirubin: 0.4 mg/dL (ref 0.2–1.2)
Total Protein: 6.8 g/dL (ref 6.1–8.1)

## 2019-08-13 LAB — LIPID PANEL
Cholesterol: 178 mg/dL (ref <200)
HDL: 49 mg/dL (ref >=40)
LDL Cholesterol (Calc): 112 mg/dL (calc) — ABNORMAL HIGH
Non-HDL Cholesterol (Calc): 129 mg/dL (calc) (ref <130)
Total CHOL/HDL Ratio: 3.6 (calc) (ref <5.0)
Triglycerides: 79 mg/dL (ref <150)

## 2019-08-13 LAB — HEMOGLOBIN A1C
Hgb A1c MFr Bld: 5.3 % of total Hgb (ref <5.7)
Mean Plasma Glucose: 105 (calc)
eAG (mmol/L): 5.8 (calc)

## 2019-08-14 ENCOUNTER — Encounter: Payer: Self-pay | Admitting: Internal Medicine

## 2019-08-14 ENCOUNTER — Other Ambulatory Visit: Payer: Self-pay

## 2019-08-14 ENCOUNTER — Ambulatory Visit (INDEPENDENT_AMBULATORY_CARE_PROVIDER_SITE_OTHER): Payer: BC Managed Care – PPO | Admitting: Internal Medicine

## 2019-08-14 VITALS — BP 108/70 | HR 69 | Temp 98.0°F | Ht 73.0 in | Wt 204.0 lb

## 2019-08-14 DIAGNOSIS — F1021 Alcohol dependence, in remission: Secondary | ICD-10-CM | POA: Diagnosis not present

## 2019-08-14 DIAGNOSIS — G250 Essential tremor: Secondary | ICD-10-CM

## 2019-08-14 DIAGNOSIS — Z8659 Personal history of other mental and behavioral disorders: Secondary | ICD-10-CM

## 2019-08-14 DIAGNOSIS — I10 Essential (primary) hypertension: Secondary | ICD-10-CM

## 2019-08-14 DIAGNOSIS — E782 Mixed hyperlipidemia: Secondary | ICD-10-CM | POA: Diagnosis not present

## 2019-08-14 MED ORDER — ROSUVASTATIN CALCIUM 5 MG PO TABS: ORAL_TABLET | ORAL | 3 refills | Status: DC

## 2019-08-14 MED ORDER — AMLODIPINE BESYLATE 10 MG PO TABS: 10.0000 mg | ORAL_TABLET | Freq: Every day | ORAL | 6 refills | Status: DC

## 2019-08-14 MED ORDER — METHOCARBAMOL 500 MG PO TABS: ORAL_TABLET | ORAL | 5 refills | Status: DC

## 2019-08-14 NOTE — Patient Instructions (Signed)
It was a pleasure to see you today.  Please take Crestor 5 mg 3 times a week.  Return in May for health maintenance exam and fasting labs.
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
## 2019-08-15 ENCOUNTER — Encounter: Payer: Self-pay | Admitting: Internal Medicine

## 2019-08-26 ENCOUNTER — Ambulatory Visit (AMBULATORY_SURGERY_CENTER): Payer: Self-pay | Admitting: *Deleted

## 2019-08-26 ENCOUNTER — Other Ambulatory Visit: Payer: Self-pay

## 2019-08-26 VITALS — Temp 96.7°F | Ht 73.0 in | Wt 207.6 lb

## 2019-08-26 DIAGNOSIS — Z1211 Encounter for screening for malignant neoplasm of colon: Secondary | ICD-10-CM

## 2019-08-26 NOTE — Progress Notes (Signed)
Pt states he used "to wake up aggressively, but no issues with last surgery 2019."  Pt was covid positive 07-12-19- doesn't need repeat covid test (within 3 months)  Pt is aware that care partner will wait in the car during procedure; if they feel like they will be too hot or cold to wait in the car; they may wait in the 4 th floor lobby. Patient is aware to bring only one care partner. We want them to wear a mask (we do not have any that we can provide them), practice social distancing, and we will check their temperatures when they get here.  I did remind the patient that their care partner needs to stay in the parking lot the entire time and have a cell phone available, we will call them when the pt is ready for discharge. Patient will wear mask into building.  No egg or soy allergy  No home oxygen use   No medications for weight loss taken  emmi information given

## 2019-09-02 ENCOUNTER — Encounter: Payer: Self-pay | Admitting: Internal Medicine

## 2019-09-09 ENCOUNTER — Ambulatory Visit (AMBULATORY_SURGERY_CENTER): Payer: BC Managed Care – PPO | Admitting: Internal Medicine

## 2019-09-09 ENCOUNTER — Other Ambulatory Visit: Payer: Self-pay

## 2019-09-09 ENCOUNTER — Encounter: Payer: Self-pay | Admitting: Internal Medicine

## 2019-09-09 VITALS — BP 104/62 | HR 60 | Temp 98.1°F | Resp 16 | Ht 73.0 in | Wt 202.0 lb

## 2019-09-09 DIAGNOSIS — Z1211 Encounter for screening for malignant neoplasm of colon: Secondary | ICD-10-CM | POA: Diagnosis not present

## 2019-09-09 MED ORDER — SODIUM CHLORIDE 0.9 % IV SOLN: 500.0000 mL | Freq: Once | INTRAVENOUS | Status: DC

## 2019-09-09 NOTE — Op Note (Signed)
[place] Patient Name: Niranjan Rufener Procedure Date: 09/09/2019 8:40 AM MRN: [id number] Endoscopist: Gatha Mayer , MD Age: 54 Referring MD:  Date of Birth: 1965/12/20 Gender: Male Account #: [id number] Procedure:                Colonoscopy Indications:              Screening for colorectal malignant neoplasm, This                            is the patient's first colonoscopy Medicines:                Propofol per Anesthesia, Monitored Anesthesia Care Procedure:                Pre-Anesthesia Assessment:                           - Prior to the procedure, a History and Physical                            was performed, and patient medications and                            allergies were reviewed. The patient's tolerance of                            previous anesthesia was also reviewed. The risks                            and benefits of the procedure and the sedation                            options and risks were discussed with the patient.                            All questions were answered, and informed consent                            was obtained. Prior Anticoagulants: The patient has                            taken no previous anticoagulant or antiplatelet                            agents. ASA Grade Assessment: II - A patient with                            mild systemic disease. After reviewing the risks                            and benefits, the patient was deemed in                            satisfactory condition to undergo the procedure.  After obtaining informed consent, the colonoscope                            was passed under direct vision. Throughout the                            procedure, the patient's blood pressure, pulse, and                            oxygen saturations were monitored continuously. The                            Colonoscope was introduced through the anus and   advanced to the the cecum, identified by                            appendiceal orifice and ileocecal valve. The                            ileocecal valve, appendiceal orifice, and rectum                            were photographed. The quality of the bowel                            preparation was good. The bowel preparation used                            was Miralax via split dose instruction. Scope In: 8:53:50 AM Scope Out: 9:10:24 AM Scope Withdrawal Time: 0 hours 12 minutes 12 seconds  Total Procedure Duration: 0 hours 16 minutes 34 seconds  Findings:                 The perianal and digital rectal examinations were                            normal. Pertinent negatives include normal prostate                            (size, shape, and consistency).                           Multiple small and large-mouthed diverticula were                            found in the sigmoid colon and descending colon.                           The exam was otherwise without abnormality on                            direct and retroflexion views. Complications:            No immediate complications. Estimated blood loss:  None. Estimated Blood Loss:     Estimated blood loss: none. Impression:               - Moderate diverticulosis in the sigmoid colon and                            in the descending colon.                           - The examination was otherwise normal on direct                            and retroflexion views.                           - No specimens collected. Recommendation:           - Repeat colonoscopy in 10 years for screening                            purposes.                           - Resume previous diet.                           - Continue present medications. Iva Boop, MD 09/09/2019 9:18:03 AM This report has been signed electronically.

## 2019-09-09 NOTE — Progress Notes (Signed)
Pt's states no medical or surgical changes since previsit or office visit.  Vitals- Courtney Temp- June 

## 2019-09-09 NOTE — Patient Instructions (Addendum)
No polyps or cancer seen today.  You do have diverticulosis - thickened muscle rings and pouches in the colon wall. Please read the handout about this condition.  Next routine colonoscopy or other screening test in 10 years - 2031  I appreciate the opportunity to care for you. Iva Boop, MD, [facility]   .YOU HAD AN ENDOSCOPIC PROCEDURE TODAY AT THE [facility]:   Refer to the procedure report that was given to you for any specific questions about what was found during the examination.  If the procedure report does not answer your questions, please call your gastroenterologist to clarify.  If you requested that your care partner not be given the details of your procedure findings, then the procedure report has been included in a sealed envelope for you to review at your convenience later.  YOU SHOULD EXPECT: Some feelings of bloating in the abdomen. Passage of more gas than usual.  Walking can help get rid of the air that was put into your GI tract during the procedure and reduce the bloating. If you had a lower endoscopy (such as a colonoscopy or flexible sigmoidoscopy) you may notice spotting of blood in your stool or on the toilet paper. If you underwent a bowel prep for your procedure, you may not have a normal bowel movement for a few days.  Please Note:  You might notice some irritation and congestion in your nose or some drainage.  This is from the oxygen used during your procedure.  There is no need for concern and it should clear up in a day or so.  SYMPTOMS TO REPORT IMMEDIATELY:   Following lower endoscopy (colonoscopy or flexible sigmoidoscopy):  Excessive amounts of blood in the stool  Significant tenderness or worsening of abdominal pains  Swelling of the abdomen that is new, acute  Fever of 100F or higher   For urgent or emergent issues, a gastroenterologist can be reached at any hour by calling [phone].   DIET:  We do recommend a small meal at first,  but then you may proceed to your regular diet.  Drink plenty of fluids but you should avoid alcoholic beverages for 24 hours.  ACTIVITY:  You should plan to take it easy for the rest of today and you should NOT DRIVE or use heavy machinery until tomorrow (because of the sedation medicines used during the test).    FOLLOW UP: Our staff will call the number listed on your records 48-72 hours following your procedure to check on you and address any questions or concerns that you may have regarding the information given to you following your procedure. If we do not reach you, we will leave a message.  We will attempt to reach you two times.  During this call, we will ask if you have developed any symptoms of COVID 19. If you develop any symptoms (ie: fever, flu-like symptoms, shortness of breath, cough etc.) before then, please call [phone].  If you test positive for Covid 19 in the 2 weeks post procedure, please call and report this information to [place].    If any biopsies were taken you will be contacted by phone or by letter within the next 1-3 weeks.  Please call us at [phone] if you have not heard about the biopsies in 3 weeks.    SIGNATURES/CONFIDENTIALITY: You and/or your care partner have signed paperwork which will be entered into your electronic medical record.  These signatures attest to the fact that that  the information above on your After Visit Summary has been reviewed and is understood.  Full responsibility of the confidentiality of this discharge information lies with you and/or your care-partner.

## 2019-09-09 NOTE — Progress Notes (Signed)
Report to PACU, RN, vss, BBS= Clear.  

## 2019-09-11 ENCOUNTER — Telehealth: Payer: Self-pay

## 2019-09-11 NOTE — Telephone Encounter (Signed)
  Follow up Call-  Call back number 09/09/2019  Post procedure Call Back phone  # [phone]  Permission to leave phone message Yes  Some recent data might be hidden     Patient questions:  Do you have a fever, pain , or abdominal swelling? No. Pain Score  0 *  Have you tolerated food without any problems? Yes.    Have you been able to return to your normal activities? Yes.    Do you have any questions about your discharge instructions: Diet   No. Medications  No. Follow up visit  No.  Do you have questions or concerns about your Care? No.  Actions: * If pain score is 4 or above: No action needed, pain <4. 1. Have you developed a fever since your procedure? no  2.   Have you had an respiratory symptoms (SOB or cough) since your procedure? no  3.   Have you tested positive for COVID 19 since your procedure no  4.   Have you had any family members/close contacts diagnosed with the COVID 19 since your procedure?  no   If yes to any of these questions please route to Laverna Peace, RN and Jennye Boroughs, Charity fundraiser.   COVID-19 Vaccine Information can be found at: [email or web address] For questions related to vaccine distribution or appointments, please email [email or web address] or call [phone].

## 2019-09-29 ENCOUNTER — Other Ambulatory Visit: Payer: Self-pay | Admitting: Internal Medicine

## 2019-10-02 ENCOUNTER — Encounter: Payer: Self-pay | Admitting: Neurology

## 2019-10-08 ENCOUNTER — Other Ambulatory Visit: Payer: Self-pay | Admitting: Internal Medicine

## 2019-10-14 NOTE — Progress Notes (Deleted)
Charles Villarreal was seen today in follow up for essential tremor.  My previous records were reviewed prior to todays visit. Pt denies falls.  Pt denies lightheadedness, near syncope.  No hallucinations.  Mood has been good.  Patient last saw primary care in December, 2020.  Medical records are reviewed.  Current prescribed movement disorder medications: ***Inderal LA, 120 mg daily   PREVIOUS MEDICATIONS: {Parkinson's RX:18200} primidone, now off (tolerated it well)  ALLERGIES:  No Known Allergies  CURRENT MEDICATIONS:  Outpatient Encounter Medications as of 10/17/2019  Medication Sig  . amLODipine (NORVASC) 10 MG tablet Take 1 tablet (10 mg total) by mouth daily.  Marland Kitchen aspirin EC 81 MG tablet Take 81 mg by mouth daily.  . busPIRone (BUSPAR) 15 MG tablet TAKE ONE TABLET THREE TIMES DAILY  . DULoxetine (CYMBALTA) 60 MG capsule Take 1 capsule by mouth daily.  Marland Kitchen ibuprofen (ADVIL) 800 MG tablet TAKE ONE TABLET EVERY EIGHT HOURS AS NEEDED  . Melatonin 10 MG TBCR Take as directed   . methocarbamol (ROBAXIN) 500 MG tablet TAKE 2 TABLETS BY MOUTH TWICE A DAY  . Naltrexone (VIVITROL IM) Inject into the muscle every 30 (thirty) days.  . prazosin (MINIPRESS) 5 MG capsule TAKE 2 CAPSULES AT BEDTIME  . rosuvastatin (CRESTOR) 5 MG tablet One po 3 times a week with supper  . tadalafil (CIALIS) 5 MG tablet Take 1 tablet (5 mg total) by mouth daily as needed for erectile dysfunction.   No facility-administered encounter medications on file as of 10/17/2019.    PHYSICAL EXAMINATION:    VITALS:  There were no vitals filed for this visit.  GEN:  The patient appears stated age and is in NAD. HEENT:  Normocephalic, atraumatic.  The mucous membranes are moist. The superficial temporal arteries are without ropiness or tenderness. CV:  RRR Lungs:  CTAB Neck/HEME:  There are no carotid bruits bilaterally.  Neurological examination:  Orientation: The patient is alert and oriented x3. Cranial nerves:  There is good facial symmetry. The speech is fluent and clear. Soft palate rises symmetrically and there is no tongue deviation. Hearing is intact to conversational tone. Sensation: Sensation is intact to light touch throughout Motor: Strength is at least antigravity x4.  Movement examination: Tone: There is normal tone in the UE/LE Abnormal movements: *** Coordination:  There is *** decremation with RAM's, *** Gait and Station: The patient has *** difficulty arising out of a deep-seated chair without the use of the hands. The patient's stride length is good  I have reviewed and interpreted the following labs independently   Chemistry      Component Value Date/Time   NA 136 02/07/2019 1023   K 4.7 02/07/2019 1023   CL 102 02/07/2019 1023   CO2 23 02/07/2019 1023   BUN 17 02/07/2019 1023   CREATININE 0.98 02/07/2019 1023      Component Value Date/Time   CALCIUM 9.3 02/07/2019 1023   ALKPHOS 94 02/03/2018 0046   AST 18 08/12/2019 0924   ALT 19 08/12/2019 0924   BILITOT 0.4 08/12/2019 0924     Lab Results  Component Value Date   HGBA1C 5.3 08/12/2019     ASSESSMENT/PLAN:  1.  Essential Tremor  ***  Total time spent on today's visit was ***greater than 30 minutes, including both face-to-face time and nonface-to-face time.  Time included that spent on review of records (prior notes available to me/labs/imaging if pertinent), discussing treatment and goals, answering patient's questions and coordinating care.  Cc:  Margaree Mackintosh, MD

## 2019-10-17 ENCOUNTER — Ambulatory Visit: Payer: Self-pay | Admitting: Neurology

## 2019-10-18 IMAGING — CT CT HEAD W/O CM
3 of 4 series · 16 of 47 positions shown, 19 images · non-contrast
Comparison: MR brain 08/12/2012.

CLINICAL DATA: Off balance, unsteady gait for 3 days.

EXAM:
CT HEAD WITHOUT CONTRAST
TECHNIQUE: Contiguous axial images were obtained from the base of the skull
through the vertex without intravenous contrast.

[Series 32: 3d filtered head w/o · axial · non-contrast · 0.49mm/px · z∈[-11,+134]mm · 10 of 35 slices shown, 13 images]
[im 3/35  brain]
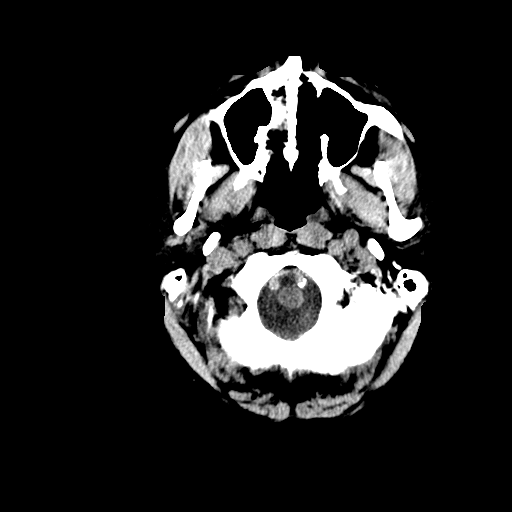
[im 3/35  bone]
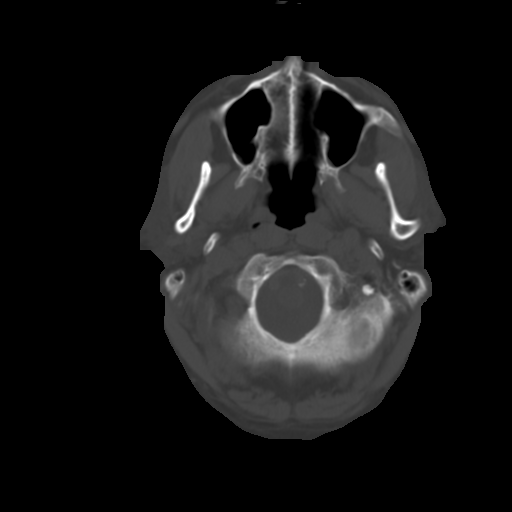
[im 5/35  brain]
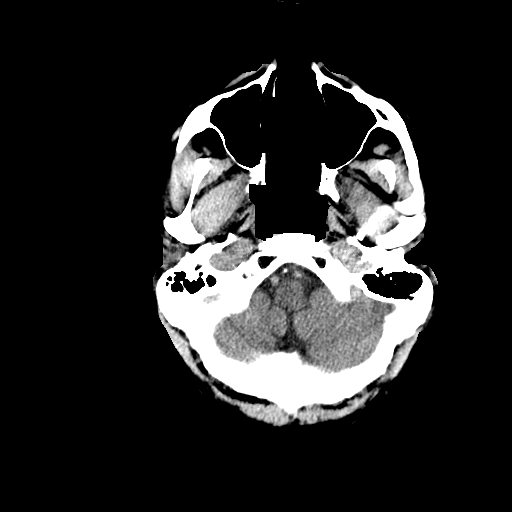
[im 10/35  brain]
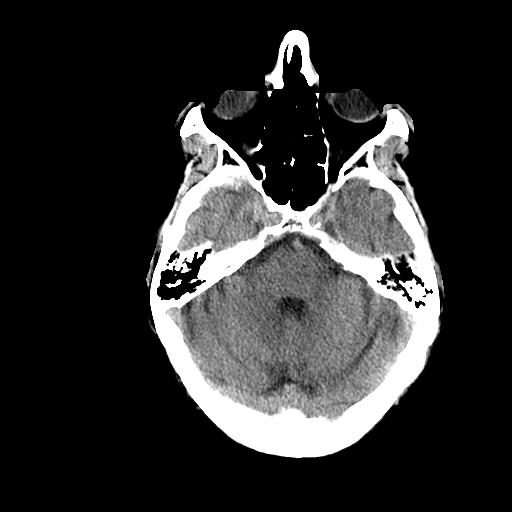
[im 13/35  brain]
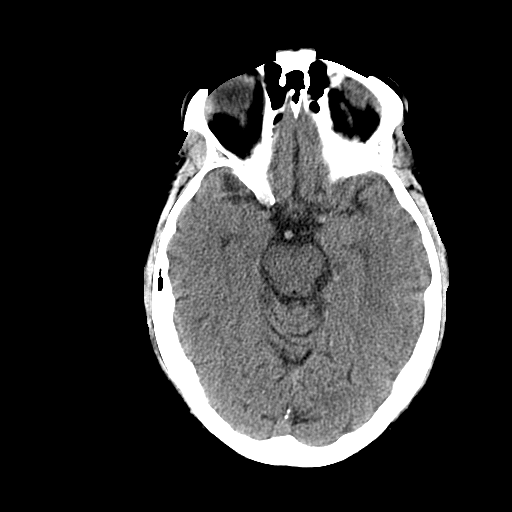
[im 15/35  brain]
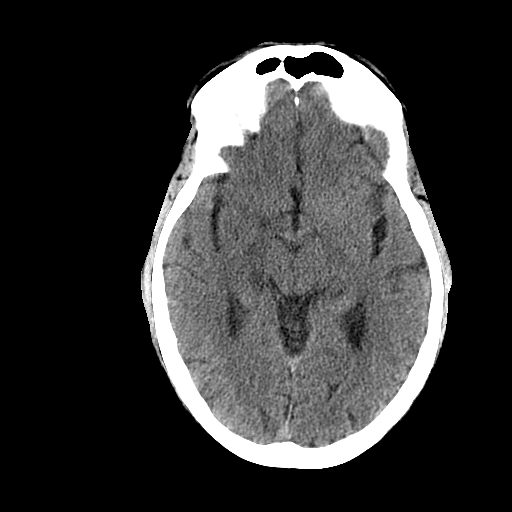
[im 15/35  bone]
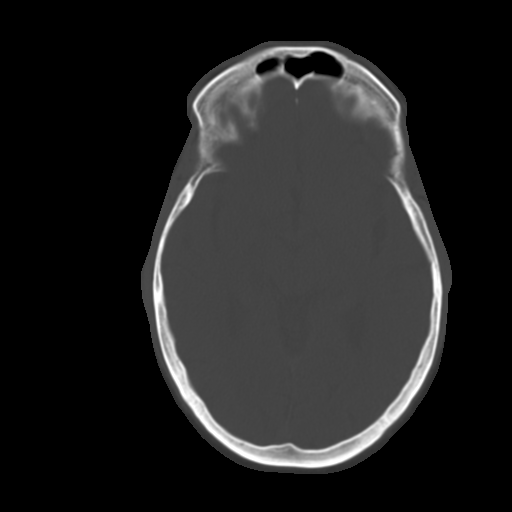
[im 20/35  brain]
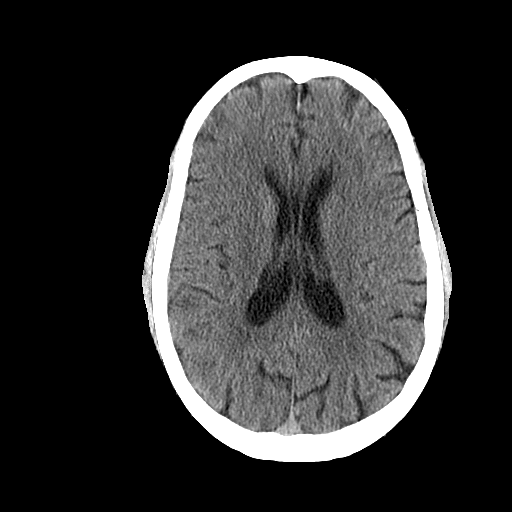
[im 22/35  brain]
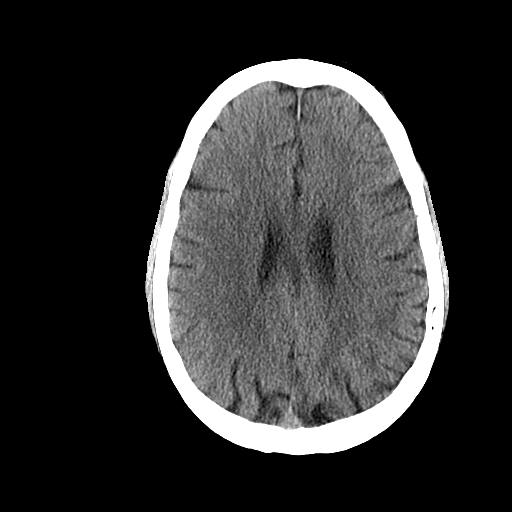
[im 25/35  brain]
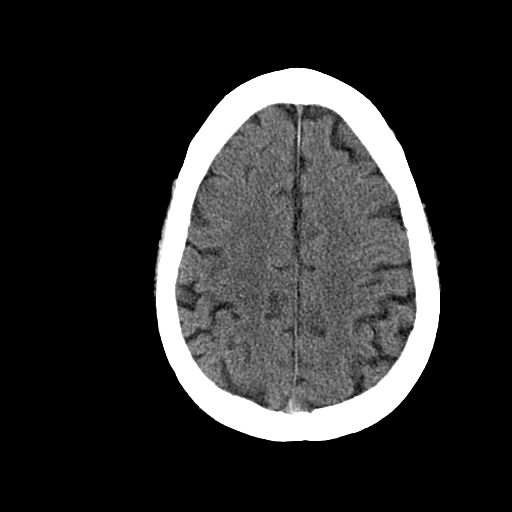
[im 30/35  brain]
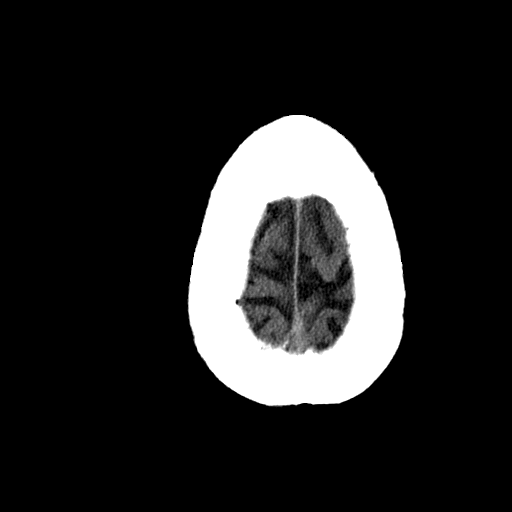
[im 30/35  bone]
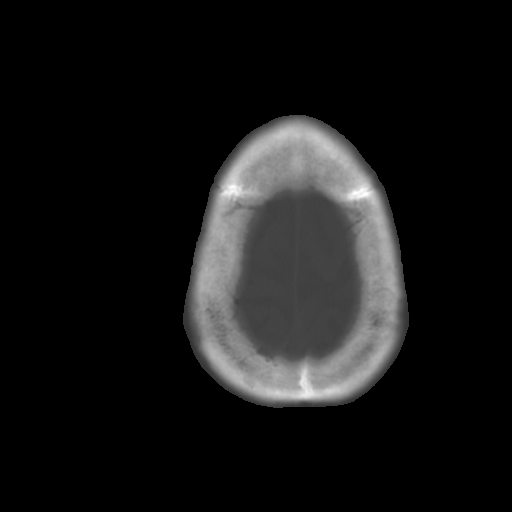
[im 32/35  brain]
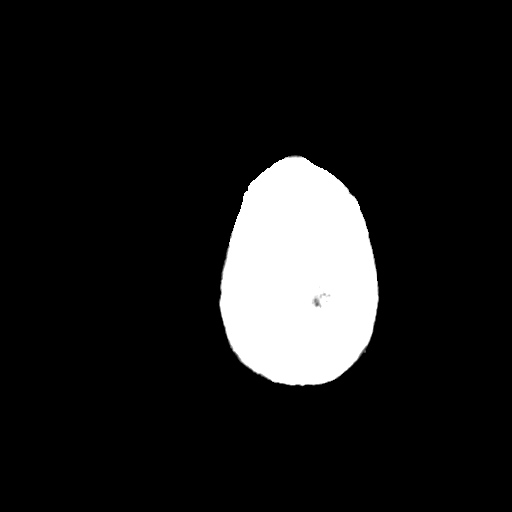

[Series 601: coronal brain · coronal · 0.49mm/px · 3 of 78 slices shown]
[im 26/78  brain]
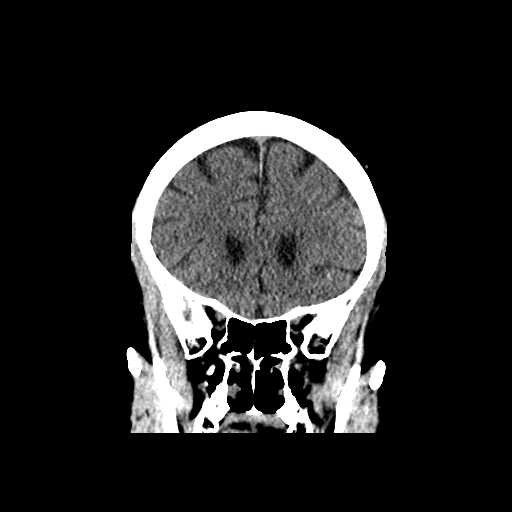
[im 35/78  brain]
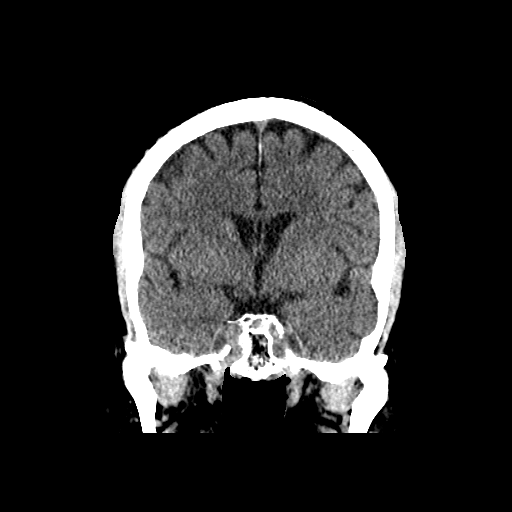
[im 43/78  brain]
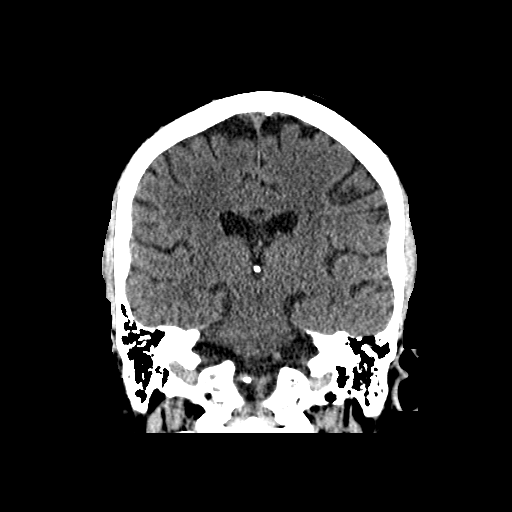

[Series 602: sagittal brain · sagittal · 0.49mm/px · 3 of 61 slices shown]
[im 21/61  brain]
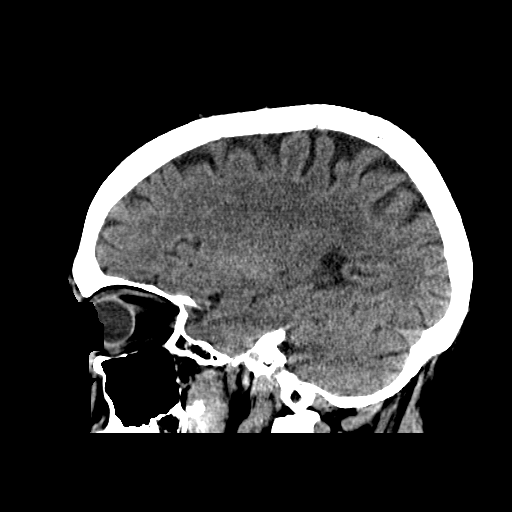
[im 31/61  brain]
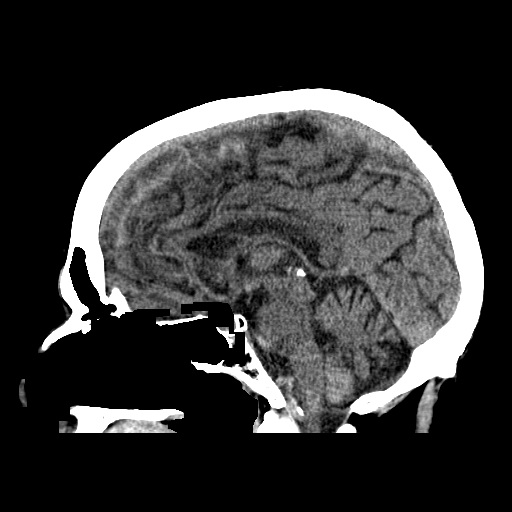
[im 41/61  brain]
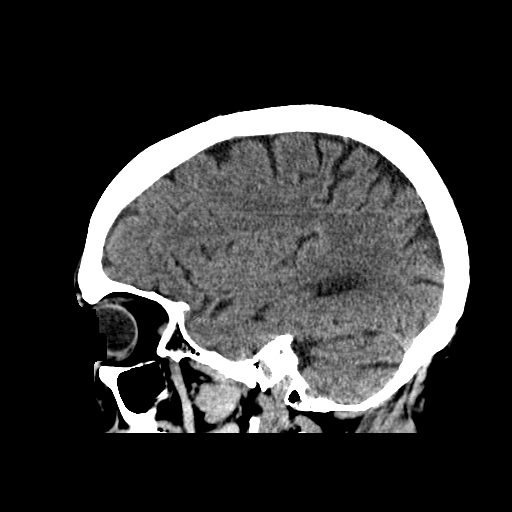

[16 of 47 positions shown; findings below may reference images not displayed]

FINDINGS: Brain: No evidence for acute infarction, hemorrhage, mass lesion,
hydrocephalus, or extra-axial fluid. Normal for age cerebral volume.
No significant white matter disease.

Vascular: Calcification of the cavernous internal carotid arteries
and distal vertebral arteries consistent with cerebrovascular
atherosclerotic disease. No signs of intracranial large vessel
occlusion.

Skull: Normal. Negative for fracture or focal lesion.

Sinuses/Orbits: No acute finding.

Other: None.
IMPRESSION: Negative exam.  No acute or focal intracranial abnormality.

## 2019-10-31 ENCOUNTER — Telehealth: Payer: Self-pay

## 2019-10-31 ENCOUNTER — Encounter: Payer: Self-pay | Admitting: Internal Medicine

## 2019-10-31 ENCOUNTER — Ambulatory Visit (INDEPENDENT_AMBULATORY_CARE_PROVIDER_SITE_OTHER): Payer: BC Managed Care – PPO | Admitting: Internal Medicine

## 2019-10-31 VITALS — Ht 73.0 in | Wt 204.0 lb

## 2019-10-31 DIAGNOSIS — F1021 Alcohol dependence, in remission: Secondary | ICD-10-CM

## 2019-10-31 DIAGNOSIS — J0100 Acute maxillary sinusitis, unspecified: Secondary | ICD-10-CM | POA: Diagnosis not present

## 2019-10-31 DIAGNOSIS — I10 Essential (primary) hypertension: Secondary | ICD-10-CM | POA: Diagnosis not present

## 2019-10-31 MED ORDER — DOXYCYCLINE HYCLATE 100 MG PO TABS: 100.0000 mg | ORAL_TABLET | Freq: Two times a day (BID) | ORAL | 0 refills | Status: DC

## 2019-10-31 MED ORDER — PREDNISONE 10 MG PO TABS: ORAL_TABLET | ORAL | 0 refills | Status: DC

## 2019-10-31 NOTE — Telephone Encounter (Signed)
Appointment scheduled.

## 2019-10-31 NOTE — Telephone Encounter (Signed)
Set up virtual visit. May have had Covid-19

## 2019-10-31 NOTE — Telephone Encounter (Signed)
Patient called has had (sinusitis) symptoms since November and would like to know what to do?

## 2019-11-04 ENCOUNTER — Other Ambulatory Visit: Payer: Self-pay | Admitting: Internal Medicine

## 2019-11-18 IMAGING — CT CT HEAD W/O CM
4 series · 16 of 47 positions shown, 18 images · non-contrast
Comparison: Head CT without contrast 09/25/2017. Brain MRI
08/12/2012

CLINICAL DATA: 51-year-old male with slurred speech and abnormal
gait for 3 days.

EXAM:
CT HEAD WITHOUT CONTRAST
TECHNIQUE: Contiguous axial images were obtained from the base of the skull
through the vertex without intravenous contrast.

[Series 3: head wo · axial · 0.47mm/px · z∈[-133,+2]mm · 7 of 37 slices shown, 9 images]
[im 5/37  brain]
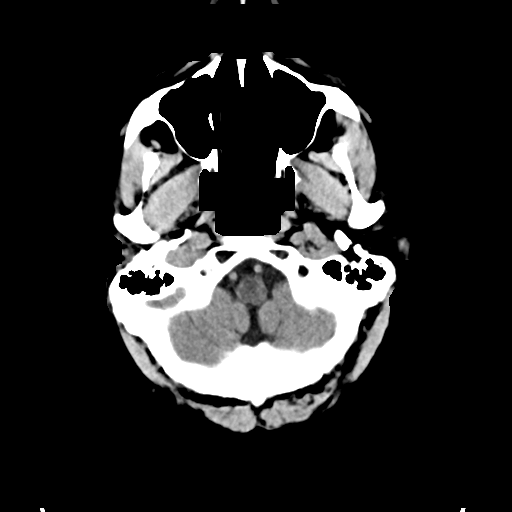
[im 5/37  bone]
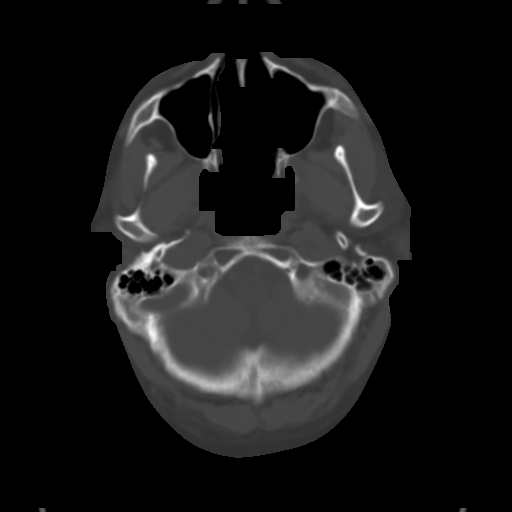
[im 10/37  brain]
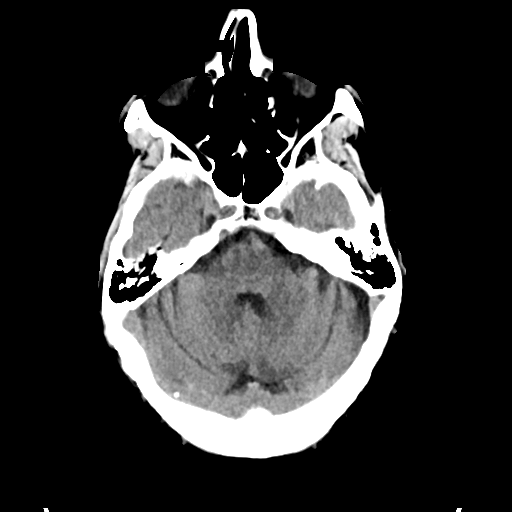
[im 14/37  brain]
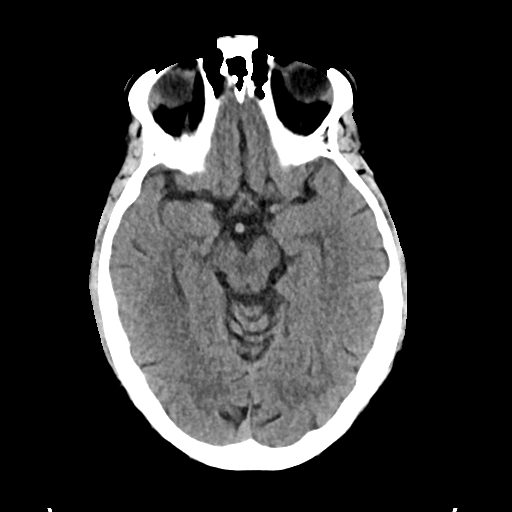
[im 19/37  brain]
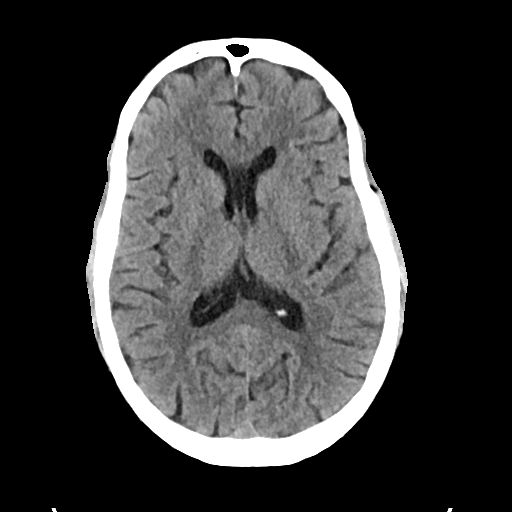
[im 23/37  brain]
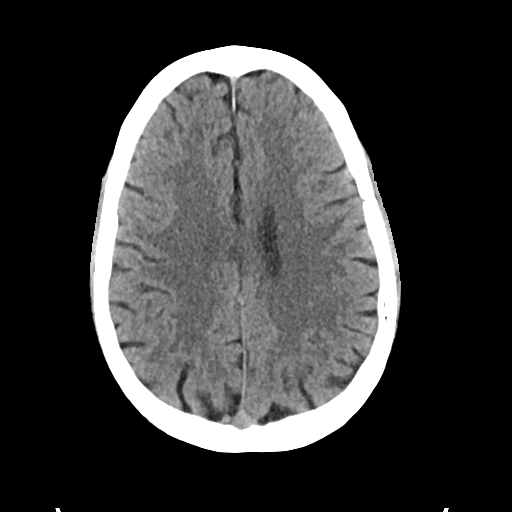
[im 23/37  bone]
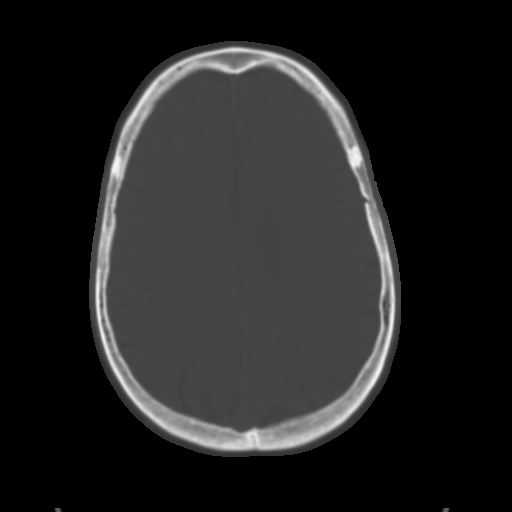
[im 28/37  brain]
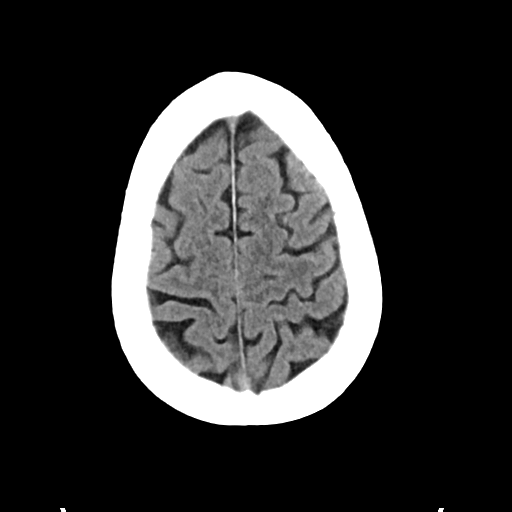
[im 32/37  brain]
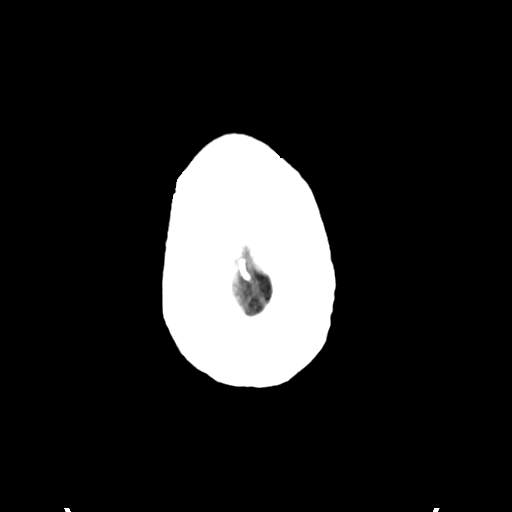

[Series 4: head bone · axial · 0.47mm/px · z∈[-135,-99]mm · 3 of 92 slices shown]
[im 10/92  bone]
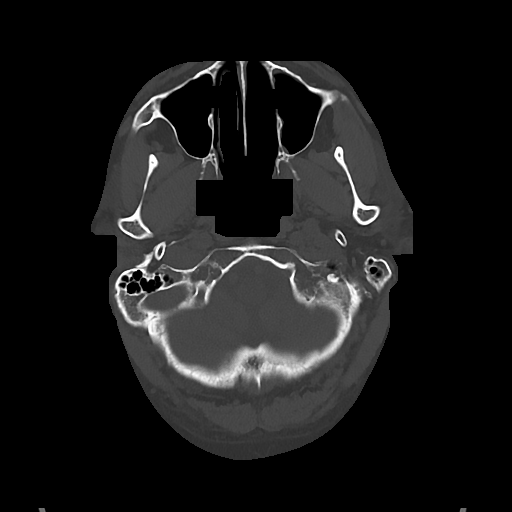
[im 19/92  bone]
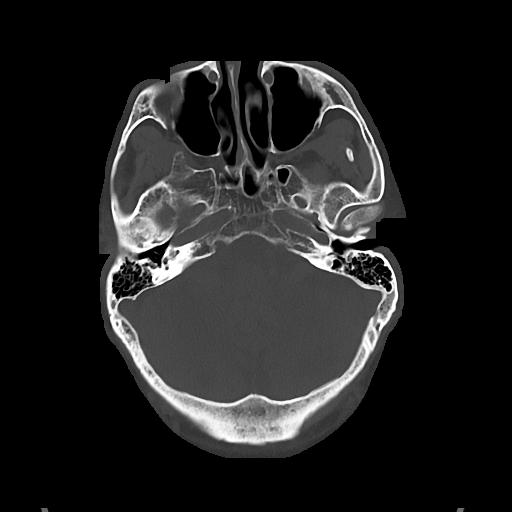
[im 28/92  bone]
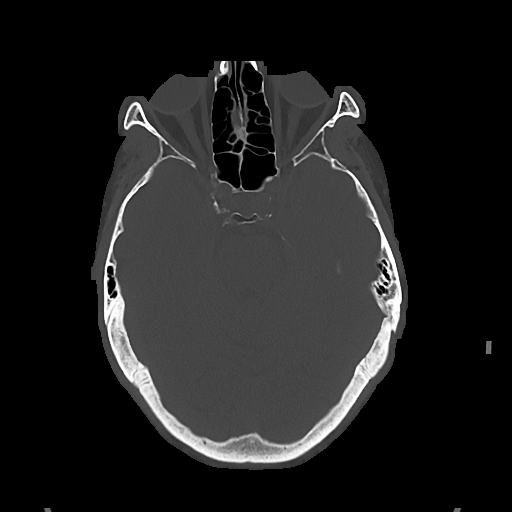

[Series 5: cor soft · coronal · 0.36mm/px · 3 of 80 slices shown]
[im 27/80  brain]
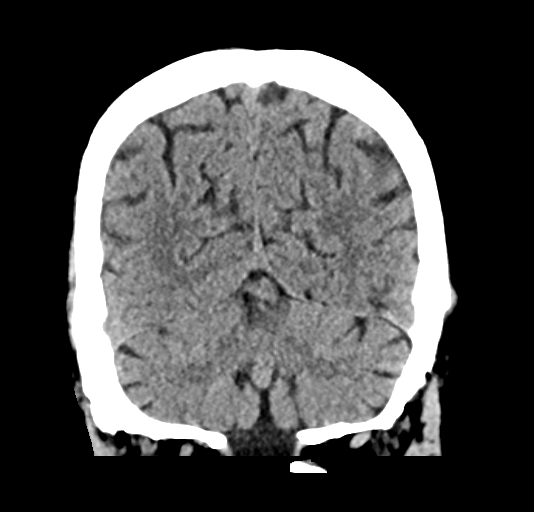
[im 36/80  brain]
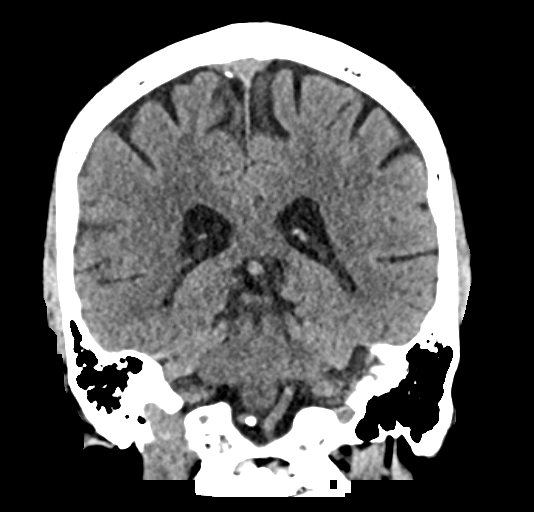
[im 44/80  brain]
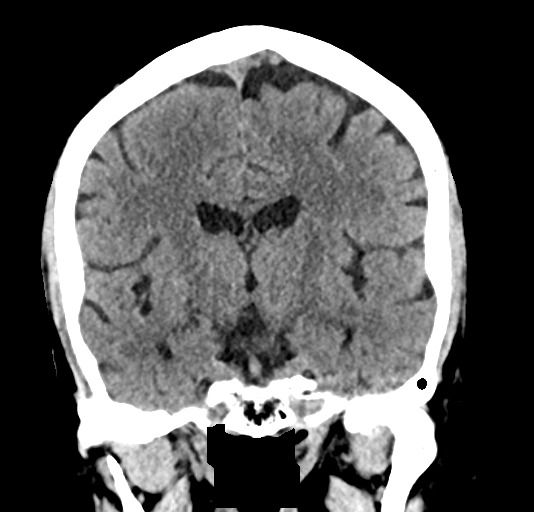

[Series 6: sag soft · sagittal · 0.37mm/px · 3 of 64 slices shown]
[im 22/64  brain]
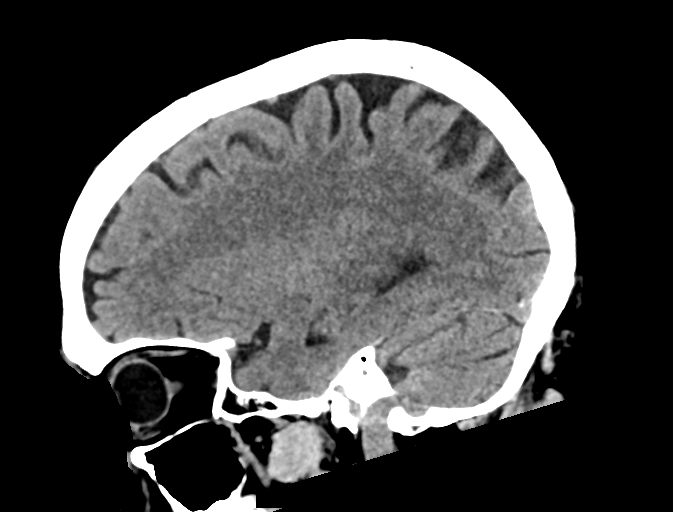
[im 32/64  brain]
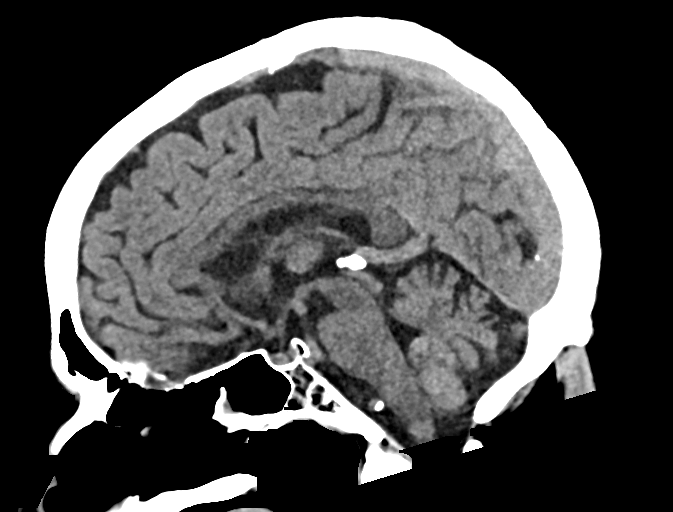
[im 43/64  brain]
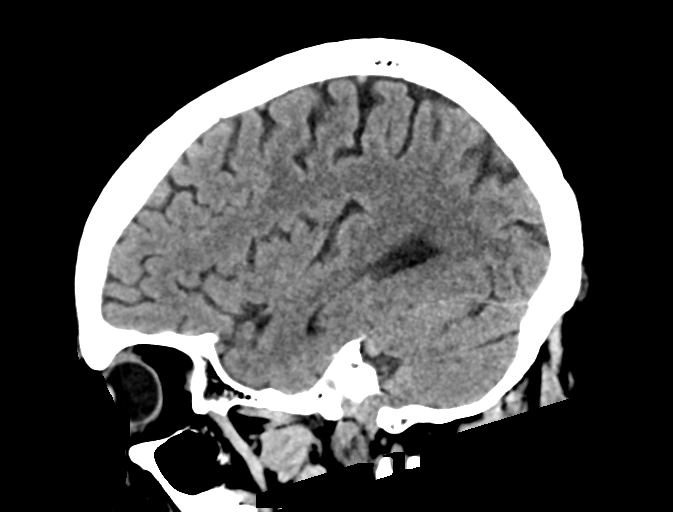

[16 of 47 positions shown; findings below may reference images not displayed]

FINDINGS: Brain: Cavum septum pellucidum, normal variant. No midline shift,
ventriculomegaly, mass effect, evidence of mass lesion, intracranial
hemorrhage or evidence of cortically based acute infarction.
Gray-white matter differentiation is within normal limits throughout
the brain. No encephalomalacia identified.

Vascular: Calcified atherosclerosis at the skull base. Chronically
tortuous intracranial arteries.

Skull: Stable and negative.

Sinuses/Orbits: Paranasal sinuses and mastoids are stable and well
pneumatized.

Other: Visualized orbits and scalp soft tissues are within normal
limits.
IMPRESSION: Stable and negative noncontrast head CT.

## 2019-11-23 ENCOUNTER — Encounter: Payer: Self-pay | Admitting: Internal Medicine
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
## 2019-11-23 NOTE — Patient Instructions (Addendum)
It was a pleasure to see you remotely today.  Take prednisone in tapering course as directed starting with 60 mg and decreasing by 10 mg daily over 6 days.  Take doxycycline 100 mg twice daily for 10 days.  Call if not better in 10 days.

## 2019-12-18 ENCOUNTER — Other Ambulatory Visit: Payer: Self-pay

## 2019-12-18 ENCOUNTER — Ambulatory Visit (INDEPENDENT_AMBULATORY_CARE_PROVIDER_SITE_OTHER): Payer: 59 | Admitting: Internal Medicine

## 2019-12-18 ENCOUNTER — Encounter: Payer: Self-pay | Admitting: Internal Medicine

## 2019-12-18 VITALS — BP 100/80 | HR 68 | Temp 97.9°F | Ht 73.0 in | Wt 211.0 lb

## 2019-12-18 DIAGNOSIS — F1021 Alcohol dependence, in remission: Secondary | ICD-10-CM | POA: Diagnosis not present

## 2019-12-18 DIAGNOSIS — Z8659 Personal history of other mental and behavioral disorders: Secondary | ICD-10-CM | POA: Diagnosis not present

## 2019-12-18 DIAGNOSIS — J0101 Acute recurrent maxillary sinusitis: Secondary | ICD-10-CM | POA: Diagnosis not present

## 2019-12-18 DIAGNOSIS — I10 Essential (primary) hypertension: Secondary | ICD-10-CM | POA: Diagnosis not present

## 2019-12-18 MED ORDER — AZITHROMYCIN 250 MG PO TABS: ORAL_TABLET | ORAL | 0 refills | Status: DC

## 2019-12-18 MED ORDER — PREDNISONE 10 MG PO TABS: ORAL_TABLET | ORAL | 0 refills | Status: DC

## 2019-12-18 NOTE — Progress Notes (Signed)
   Subjective:    Patient ID: Charles Villarreal, male    DOB: 07-04-1966, 54 y.o.   MRN: [id number]  HPI Patient had virtual visit October 31, 2019 for sinusitis and at that time complained of sinus symptoms since November.  Patient has history of essential hypertension and hyperlipidemia.  Had Covid exposure at work but did not become ill and has had 2 COVID-19 vaccines.  On March 5 patient treated with doxycycline 100 mg twice daily for 10 days and prednisone 10 mg tablets (#21) in tapering course starting with 60 mg and decreasing by 10 mg daily over 7 days.  Patient complaining of some discolored nasal drainage brown to yellowish color.  No fever or chills.  No dysgeusia.  No COVID-19 symptoms.    Review of Systems no recent travel     Objective:   Physical Exam  Left TM looks a bit retracted centrally but it is not red.  Neck is supple without JVD thyromegaly or carotid bruits.  Pharynx is clear.  Right TM is clear.  Chest is clear to auscultation without rales or wheezing.      Assessment & Plan:  Acute recurrent maxillary sinusitis  Left serous otitis media  Essential hypertension-stable on current regimen  Plan: Zithromax Z-PAK take 2 p.o. day 1 followed by 1 p.o. days 2 through 5.  Take prednisone in tapering course as directed starting with 60 mg day 1 and decreasing by 10 mg daily over 6 days.  If symptoms persist, will refer to allergist.

## 2019-12-24 NOTE — Patient Instructions (Addendum)
Take Zithromax 2 p.o. day 1 followed by 1 p.o. days 2 through 5.  Take prednisone in tapering course starting with 60 mg day 1 and decreasing by 10 mg daily over 6 days.  If symptoms persist, will refer to allergist.  It was a pleasure to see you today.

## 2020-01-05 ENCOUNTER — Other Ambulatory Visit: Payer: Self-pay

## 2020-01-05 ENCOUNTER — Other Ambulatory Visit: Payer: 59 | Admitting: Internal Medicine

## 2020-01-05 DIAGNOSIS — Z8659 Personal history of other mental and behavioral disorders: Secondary | ICD-10-CM

## 2020-01-05 DIAGNOSIS — G250 Essential tremor: Secondary | ICD-10-CM

## 2020-01-05 DIAGNOSIS — F1021 Alcohol dependence, in remission: Secondary | ICD-10-CM

## 2020-01-05 DIAGNOSIS — E782 Mixed hyperlipidemia: Secondary | ICD-10-CM

## 2020-01-05 DIAGNOSIS — I10 Essential (primary) hypertension: Secondary | ICD-10-CM

## 2020-01-05 LAB — LIPID PANEL
Cholesterol: 218 mg/dL — ABNORMAL HIGH (ref <200)
HDL: 54 mg/dL (ref >=40)
LDL Cholesterol (Calc): 138 mg/dL (calc) — ABNORMAL HIGH
Non-HDL Cholesterol (Calc): 164 mg/dL (calc) — ABNORMAL HIGH (ref <130)
Total CHOL/HDL Ratio: 4 (calc) (ref <5.0)
Triglycerides: 133 mg/dL (ref <150)

## 2020-01-05 LAB — COMPLETE METABOLIC PANEL WITH GFR
AG Ratio: 1.7 (calc) (ref 1.0–2.5)
ALT: 20 U/L (ref 9–46)
AST: 18 U/L (ref 10–35)
Albumin: 4.2 g/dL (ref 3.6–5.1)
Alkaline phosphatase (APISO): 70 U/L (ref 35–144)
BUN: 21 mg/dL (ref 7–25)
CO2: 22 mmol/L (ref 20–32)
Calcium: 9.5 mg/dL (ref 8.6–10.3)
Chloride: 105 mmol/L (ref 98–110)
Creat: 0.98 mg/dL (ref 0.70–1.33)
GFR, Est African American: 100 mL/min/{1.73_m2} (ref >=60)
GFR, Est Non African American: 86 mL/min/{1.73_m2} (ref >=60)
Globulin: 2.5 g/dL (calc) (ref 1.9–3.7)
Glucose, Bld: 96 mg/dL (ref 65–99)
Potassium: 4.3 mmol/L (ref 3.5–5.3)
Sodium: 139 mmol/L (ref 135–146)
Total Bilirubin: 0.4 mg/dL (ref 0.2–1.2)
Total Protein: 6.7 g/dL (ref 6.1–8.1)

## 2020-01-05 LAB — CBC WITH DIFFERENTIAL/PLATELET
Absolute Monocytes: 519 cells/uL (ref 200–950)
Basophils Absolute: 77 cells/uL (ref 0–200)
Basophils Relative: 1.3 %
Eosinophils Absolute: 537 cells/uL — ABNORMAL HIGH (ref 15–500)
Eosinophils Relative: 9.1 %
HCT: 42.6 % (ref 38.5–50.0)
Hemoglobin: 14.5 g/dL (ref 13.2–17.1)
Lymphs Abs: 1422 cells/uL (ref 850–3900)
MCH: 30.9 pg (ref 27.0–33.0)
MCHC: 34 g/dL (ref 32.0–36.0)
MCV: 90.8 fL (ref 80.0–100.0)
MPV: 9.9 fL (ref 7.5–12.5)
Monocytes Relative: 8.8 %
Neutro Abs: 3345 cells/uL (ref 1500–7800)
Neutrophils Relative %: 56.7 %
Platelets: 246 10*3/uL (ref 140–400)
RBC: 4.69 10*6/uL (ref 4.20–5.80)
RDW: 12 % (ref 11.0–15.0)
Total Lymphocyte: 24.1 %
WBC: 5.9 10*3/uL (ref 3.8–10.8)

## 2020-01-05 LAB — PSA: PSA: 1.2 ng/mL (ref <=4.0)

## 2020-01-13 ENCOUNTER — Encounter: Payer: BC Managed Care – PPO | Admitting: Internal Medicine

## 2020-01-16 ENCOUNTER — Encounter: Payer: 59 | Admitting: Internal Medicine

## 2020-02-12 ENCOUNTER — Other Ambulatory Visit: Payer: Self-pay | Admitting: Internal Medicine

## 2020-02-12 MED ORDER — IBUPROFEN 800 MG PO TABS: ORAL_TABLET | ORAL | 0 refills | Status: DC

## 2020-02-12 NOTE — Telephone Encounter (Signed)
Received Fax RX request from  Pharmacy -  [facility] - [place], [place] - [address] Phone:  [phone]  Fax:  [phone]       Medication - ibuprofen (ADVIL) 800 MG tablet   Last Refill - 01/14/2020  Last OV - 12/18/19  Last CPE - 01/07/19  Next Appointment - 02/17/20

## 2020-02-17 ENCOUNTER — Ambulatory Visit (INDEPENDENT_AMBULATORY_CARE_PROVIDER_SITE_OTHER): Payer: 59 | Admitting: Internal Medicine

## 2020-02-17 ENCOUNTER — Other Ambulatory Visit: Payer: Self-pay

## 2020-02-17 ENCOUNTER — Encounter: Payer: Self-pay | Admitting: Internal Medicine

## 2020-02-17 VITALS — BP 100/60 | HR 68 | Ht 73.0 in | Wt 226.0 lb

## 2020-02-17 DIAGNOSIS — Z0000 Encounter for general adult medical examination without abnormal findings: Secondary | ICD-10-CM | POA: Diagnosis not present

## 2020-02-17 DIAGNOSIS — F329 Major depressive disorder, single episode, unspecified: Secondary | ICD-10-CM

## 2020-02-17 DIAGNOSIS — G250 Essential tremor: Secondary | ICD-10-CM

## 2020-02-17 DIAGNOSIS — I10 Essential (primary) hypertension: Secondary | ICD-10-CM

## 2020-02-17 DIAGNOSIS — Z8616 Personal history of COVID-19: Secondary | ICD-10-CM | POA: Diagnosis not present

## 2020-02-17 DIAGNOSIS — F1021 Alcohol dependence, in remission: Secondary | ICD-10-CM | POA: Diagnosis not present

## 2020-02-17 DIAGNOSIS — E782 Mixed hyperlipidemia: Secondary | ICD-10-CM | POA: Diagnosis not present

## 2020-02-17 DIAGNOSIS — N529 Male erectile dysfunction, unspecified: Secondary | ICD-10-CM

## 2020-02-17 DIAGNOSIS — D860 Sarcoidosis of lung: Secondary | ICD-10-CM

## 2020-02-17 DIAGNOSIS — M546 Pain in thoracic spine: Secondary | ICD-10-CM

## 2020-02-17 DIAGNOSIS — G8929 Other chronic pain: Secondary | ICD-10-CM

## 2020-02-17 DIAGNOSIS — F419 Anxiety disorder, unspecified: Secondary | ICD-10-CM

## 2020-02-17 LAB — POCT URINALYSIS DIPSTICK
Appearance: NEGATIVE
Bilirubin, UA: NEGATIVE
Blood, UA: NEGATIVE
Glucose, UA: NEGATIVE
Ketones, UA: NEGATIVE
Leukocytes, UA: NEGATIVE
Nitrite, UA: NEGATIVE
Odor: NEGATIVE
Protein, UA: NEGATIVE
Spec Grav, UA: 1.01 (ref 1.010–1.025)
Urobilinogen, UA: 0.2 E.U./dL
pH, UA: 6 (ref 5.0–8.0)
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
## 2020-02-21 ENCOUNTER — Encounter: Payer: Self-pay | Admitting: Internal Medicine

## 2020-02-21 NOTE — Patient Instructions (Signed)
It was a pleasure to see you today.  Continue same medications and work on diet exercise and weight loss.  Follow-up in 6 months.  We may need to increase Crestor to daily.

## 2020-03-12 ENCOUNTER — Other Ambulatory Visit: Payer: Self-pay | Admitting: Internal Medicine

## 2020-05-04 ENCOUNTER — Other Ambulatory Visit: Payer: Self-pay | Admitting: Internal Medicine

## 2020-05-13 ENCOUNTER — Other Ambulatory Visit: Payer: Self-pay | Admitting: Internal Medicine

## 2020-06-22 ENCOUNTER — Other Ambulatory Visit: Payer: Self-pay | Admitting: Internal Medicine

## 2020-07-28 ENCOUNTER — Other Ambulatory Visit: Payer: Self-pay | Admitting: Internal Medicine

## 2020-08-10 ENCOUNTER — Other Ambulatory Visit: Payer: 59 | Admitting: Internal Medicine

## 2020-08-10 ENCOUNTER — Other Ambulatory Visit: Payer: Self-pay

## 2020-08-10 DIAGNOSIS — E782 Mixed hyperlipidemia: Secondary | ICD-10-CM

## 2020-08-10 DIAGNOSIS — D7589 Other specified diseases of blood and blood-forming organs: Secondary | ICD-10-CM

## 2020-08-10 DIAGNOSIS — E876 Hypokalemia: Secondary | ICD-10-CM

## 2020-08-11 LAB — HEPATIC FUNCTION PANEL
AG Ratio: 1.8 (calc) (ref 1.0–2.5)
ALT: 50 U/L — ABNORMAL HIGH (ref 9–46)
AST: 24 U/L (ref 10–35)
Albumin: 4.4 g/dL (ref 3.6–5.1)
Alkaline phosphatase (APISO): 77 U/L (ref 35–144)
Bilirubin, Direct: 0.1 mg/dL (ref 0.0–0.2)
Globulin: 2.5 g/dL (calc) (ref 1.9–3.7)
Indirect Bilirubin: 0.2 mg/dL (calc) (ref 0.2–1.2)
Total Bilirubin: 0.3 mg/dL (ref 0.2–1.2)
Total Protein: 6.9 g/dL (ref 6.1–8.1)

## 2020-08-11 LAB — LIPID PANEL
Cholesterol: 185 mg/dL (ref <200)
HDL: 47 mg/dL (ref >=40)
LDL Cholesterol (Calc): 113 mg/dL (calc) — ABNORMAL HIGH
Non-HDL Cholesterol (Calc): 138 mg/dL (calc) — ABNORMAL HIGH (ref <130)
Total CHOL/HDL Ratio: 3.9 (calc) (ref <5.0)
Triglycerides: 142 mg/dL (ref <150)

## 2020-08-13 ENCOUNTER — Other Ambulatory Visit: Payer: Self-pay

## 2020-08-13 ENCOUNTER — Encounter: Payer: Self-pay | Admitting: Internal Medicine

## 2020-08-13 ENCOUNTER — Ambulatory Visit (INDEPENDENT_AMBULATORY_CARE_PROVIDER_SITE_OTHER): Payer: 59 | Admitting: Internal Medicine

## 2020-08-13 VITALS — BP 110/68 | HR 96 | Ht 73.0 in | Wt 196.0 lb

## 2020-08-13 DIAGNOSIS — G250 Essential tremor: Secondary | ICD-10-CM

## 2020-08-13 DIAGNOSIS — I10 Essential (primary) hypertension: Secondary | ICD-10-CM

## 2020-08-13 DIAGNOSIS — E782 Mixed hyperlipidemia: Secondary | ICD-10-CM

## 2020-08-13 DIAGNOSIS — R7401 Elevation of levels of liver transaminase levels: Secondary | ICD-10-CM | POA: Diagnosis not present

## 2020-08-13 DIAGNOSIS — F419 Anxiety disorder, unspecified: Secondary | ICD-10-CM

## 2020-08-13 DIAGNOSIS — F1021 Alcohol dependence, in remission: Secondary | ICD-10-CM

## 2020-08-13 DIAGNOSIS — F32A Depression, unspecified: Secondary | ICD-10-CM

## 2020-08-13 MED ORDER — IBUPROFEN 400 MG PO TABS: 400.0000 mg | ORAL_TABLET | Freq: Three times a day (TID) | ORAL | 2 refills | Status: DC | PRN

## 2020-08-13 NOTE — Progress Notes (Signed)
   Subjective:    Patient ID: Charles Villarreal, male    DOB: 1966-04-18, 54 y.o.   MRN: [id number]  HPI 54 year old Male seen for 6 month follow up visit.He was last seen here June 2021. He has essential tremor followed by Dr. Arbutus Leas, as well as essential HTN, and hyperlipidemia.  Recently had liver functions drawn in SGPT was slightly elevated at 50 normal being up to 46.  His lipid panel  total cholesterol to be 185, HDL 47, triglycerides 142 and LDL slightly elevated at 132 but improved from May 2021 when it was 138.  Total cholesterol in May 2021 was 218.  He asked that we repeat his SGPT today and we did.  Level had improved from 50 to 45.    Review of Systems no new complaints- feels well active in AA     Objective:   Physical Exam  BP 110/68, pulse 96, pulse ox 98%, weight 196 pounds. BMI 25.86  Neck is supple without JVD thyromegaly or carotid bruits.  Chest is clear to auscultation.  Cardiac exam regular rate and rhythm normal S1 and S2 without murmurs or gallops.  No hepatosplenomegaly.  No lower extremity pitting edema.      Assessment & Plan:  Essential hypertension-stable on current regimen of amlodipine and prazosin  Mild elevation of SGPT-repeat level had improved from 50-45.  This is a very mild elevation and likely an aberration.  History of alcoholism in recovery and doing well-remains on naltrexone  History of essential tremor seen by Dr. Arbutus Leas  History of chronic back pain and depression treated with Cymbalta  Takes BuSpar at and Seroquel for anxiety.  History of pulmonary sarcoidosis seen by Dr. Elesa Massed  Hyperlipidemia-lipid panel is within normal limits with the exception of very mild elevation of LDL at 113 and improved from May 2021 when it was 138.  Continue Crestor 5 mg 3 times a week with supper.  Plan: Continue current medications and follow-up in 6 months at which time he will have health maintenance exam and fasting labs.  He had colonoscopy in 2021.  We have  on file that he has had 2  Moderna vaccines in January and February 2021.

## 2020-08-13 NOTE — Progress Notes (Deleted)
   Subjective:    Patient ID: Charles Villarreal, male    DOB: [birth year], 54 y.o.   MRN: [id number]  HPI    Review of Systems     Objective:   Physical Exam        Assessment & Plan:

## 2020-08-14 LAB — ALT: ALT: 45 U/L (ref 9–46)

## 2020-09-26 ENCOUNTER — Encounter: Payer: Self-pay | Admitting: Internal Medicine

## 2020-09-26 NOTE — Patient Instructions (Signed)
It was a pleasure to see you today.  SGPT was repeated and it is normal.  Continue current medications and follow-up in June for health maintenance exam.

## 2020-09-30 ENCOUNTER — Other Ambulatory Visit: Payer: Self-pay | Admitting: Internal Medicine

## 2020-10-28 ENCOUNTER — Other Ambulatory Visit: Payer: Self-pay | Admitting: Internal Medicine

## 2020-11-27 ENCOUNTER — Other Ambulatory Visit: Payer: Self-pay | Admitting: Internal Medicine

## 2020-12-31 ENCOUNTER — Other Ambulatory Visit: Payer: Self-pay | Admitting: Internal Medicine

## 2021-01-17 ENCOUNTER — Encounter: Payer: Self-pay | Admitting: Internal Medicine

## 2021-01-17 ENCOUNTER — Other Ambulatory Visit: Payer: Self-pay

## 2021-01-17 ENCOUNTER — Telehealth: Payer: Self-pay | Admitting: Internal Medicine

## 2021-01-17 ENCOUNTER — Ambulatory Visit (INDEPENDENT_AMBULATORY_CARE_PROVIDER_SITE_OTHER): Payer: 59 | Admitting: Internal Medicine

## 2021-01-17 VITALS — BP 120/70 | HR 88 | Temp 98.3°F | Ht 73.0 in | Wt 195.0 lb

## 2021-01-17 DIAGNOSIS — K645 Perianal venous thrombosis: Secondary | ICD-10-CM

## 2021-01-17 MED ORDER — HYDROCORTISONE ACE-PRAMOXINE 1-1 % EX CREA: 1.0000 "application " | TOPICAL_CREAM | Freq: Two times a day (BID) | CUTANEOUS | 0 refills | Status: DC

## 2021-01-17 NOTE — Progress Notes (Signed)
   Subjective:    Patient ID: Charles Villarreal, male    DOB: [birth year], 55 y.o.   MRN: [id number]  HPI  55 year old Male seen for complaint of bleeding in anal area. Had onset rectal discomfort on May 19. Over the weekend had bleeding in anal area. Has had some issues with constipation recently.  Had colonoscopy January 2021 which was normal except for moderate diverticulosis.  Hx anxiety. T12 corectomy with T11-L1 fusion by Dr. Dutch Quint. Essential tremor followed by Neurology. Hx pulmonary sarcoidosis stable and followed by Dr. Sherene Sires. Has HTN and hyperlipidemia.   No history of anemia or thrombocyptopenia. LFTs normal May 2021.  Patient has hx of alcoholism. Is in recovery doing well for several years now and takes [place] through [facility].  CPE scheduled for June here.  Review of Systems moderate discomfort- anxious about bleeding     Objective:   Physical Exam VS reviewed. Anxious but NAD  At 12 o'clock has what appears to be large thrombosed hemorrhoid with clot at 3 o'clock. Tender to touch.      Assessment & Plan:  Thrombosed hemorrhoid- needs incision. We will refer to surgeon. Proctocream HC 4 times daily. Sitz baths. Does not need narcotic pain med.

## 2021-01-17 NOTE — Telephone Encounter (Signed)
4:15 pm today- might have been a thrombosed hemorrhoid

## 2021-01-17 NOTE — Patient Instructions (Signed)
Refer to [facility] for I and D large external hemorrhoid. Proctocream HC and sitz baths.

## 2021-01-17 NOTE — Telephone Encounter (Signed)
Charles Villarreal Ivor Messier [phone]  Charles Villarreal called to say he had a cyst like place near his rectum like busted yesterday it was hard and painfull, it is still bleeding today. He feels like he needs to come in for you to look at it.

## 2021-01-17 NOTE — Telephone Encounter (Signed)
Patient called to confirm appt today at 4:15pm.

## 2021-01-28 ENCOUNTER — Other Ambulatory Visit: Payer: Self-pay | Admitting: Internal Medicine

## 2021-02-14 ENCOUNTER — Other Ambulatory Visit: Payer: Self-pay

## 2021-02-14 ENCOUNTER — Other Ambulatory Visit: Payer: 59 | Admitting: Internal Medicine

## 2021-02-14 DIAGNOSIS — F419 Anxiety disorder, unspecified: Secondary | ICD-10-CM

## 2021-02-14 DIAGNOSIS — Z125 Encounter for screening for malignant neoplasm of prostate: Secondary | ICD-10-CM

## 2021-02-14 DIAGNOSIS — E782 Mixed hyperlipidemia: Secondary | ICD-10-CM

## 2021-02-14 DIAGNOSIS — Z0000 Encounter for general adult medical examination without abnormal findings: Secondary | ICD-10-CM

## 2021-02-14 DIAGNOSIS — I10 Essential (primary) hypertension: Secondary | ICD-10-CM

## 2021-02-15 LAB — COMPLETE METABOLIC PANEL WITH GFR
AG Ratio: 1.5 (calc) (ref 1.0–2.5)
ALT: 53 U/L — ABNORMAL HIGH (ref 9–46)
AST: 31 U/L (ref 10–35)
Albumin: 4.2 g/dL (ref 3.6–5.1)
Alkaline phosphatase (APISO): 282 U/L — ABNORMAL HIGH (ref 35–144)
BUN: 13 mg/dL (ref 7–25)
CO2: 27 mmol/L (ref 20–32)
Calcium: 9.6 mg/dL (ref 8.6–10.3)
Chloride: 104 mmol/L (ref 98–110)
Creat: 0.95 mg/dL (ref 0.70–1.33)
GFR, Est African American: 103 mL/min/{1.73_m2} (ref >=60)
GFR, Est Non African American: 89 mL/min/{1.73_m2} (ref >=60)
Globulin: 2.8 g/dL (calc) (ref 1.9–3.7)
Glucose, Bld: 89 mg/dL (ref 65–99)
Potassium: 4.1 mmol/L (ref 3.5–5.3)
Sodium: 139 mmol/L (ref 135–146)
Total Bilirubin: 0.6 mg/dL (ref 0.2–1.2)
Total Protein: 7 g/dL (ref 6.1–8.1)

## 2021-02-15 LAB — CBC WITH DIFFERENTIAL/PLATELET
Absolute Monocytes: 486 cells/uL (ref 200–950)
Basophils Absolute: 49 cells/uL (ref 0–200)
Basophils Relative: 0.9 %
Eosinophils Absolute: 248 cells/uL (ref 15–500)
Eosinophils Relative: 4.6 %
HCT: 41.6 % (ref 38.5–50.0)
Hemoglobin: 13.5 g/dL (ref 13.2–17.1)
Lymphs Abs: 1258 cells/uL (ref 850–3900)
MCH: 29.5 pg (ref 27.0–33.0)
MCHC: 32.5 g/dL (ref 32.0–36.0)
MCV: 90.8 fL (ref 80.0–100.0)
MPV: 10.5 fL (ref 7.5–12.5)
Monocytes Relative: 9 %
Neutro Abs: 3359 cells/uL (ref 1500–7800)
Neutrophils Relative %: 62.2 %
Platelets: 274 10*3/uL (ref 140–400)
RBC: 4.58 10*6/uL (ref 4.20–5.80)
RDW: 11.4 % (ref 11.0–15.0)
Total Lymphocyte: 23.3 %
WBC: 5.4 10*3/uL (ref 3.8–10.8)

## 2021-02-15 LAB — LIPID PANEL
Cholesterol: 157 mg/dL (ref <200)
HDL: 53 mg/dL (ref >=40)
LDL Cholesterol (Calc): 86 mg/dL (calc)
Non-HDL Cholesterol (Calc): 104 mg/dL (calc) (ref <130)
Total CHOL/HDL Ratio: 3 (calc) (ref <5.0)
Triglycerides: 90 mg/dL (ref <150)

## 2021-02-15 LAB — PSA: PSA: 1.09 ng/mL (ref <=4.00)

## 2021-02-17 ENCOUNTER — Encounter: Payer: Self-pay | Admitting: Internal Medicine

## 2021-02-17 ENCOUNTER — Other Ambulatory Visit: Payer: Self-pay

## 2021-02-17 ENCOUNTER — Ambulatory Visit (INDEPENDENT_AMBULATORY_CARE_PROVIDER_SITE_OTHER): Payer: 59 | Admitting: Internal Medicine

## 2021-02-17 VITALS — BP 130/80 | HR 68 | Ht 73.0 in | Wt 196.0 lb

## 2021-02-17 DIAGNOSIS — E782 Mixed hyperlipidemia: Secondary | ICD-10-CM

## 2021-02-17 DIAGNOSIS — Z0000 Encounter for general adult medical examination without abnormal findings: Secondary | ICD-10-CM | POA: Diagnosis not present

## 2021-02-17 DIAGNOSIS — Z8659 Personal history of other mental and behavioral disorders: Secondary | ICD-10-CM

## 2021-02-17 DIAGNOSIS — R748 Abnormal levels of other serum enzymes: Secondary | ICD-10-CM

## 2021-02-17 DIAGNOSIS — D860 Sarcoidosis of lung: Secondary | ICD-10-CM

## 2021-02-17 DIAGNOSIS — G250 Essential tremor: Secondary | ICD-10-CM

## 2021-02-17 DIAGNOSIS — F32A Depression, unspecified: Secondary | ICD-10-CM

## 2021-02-17 DIAGNOSIS — F1021 Alcohol dependence, in remission: Secondary | ICD-10-CM

## 2021-02-17 DIAGNOSIS — I10 Essential (primary) hypertension: Secondary | ICD-10-CM | POA: Diagnosis not present

## 2021-02-17 DIAGNOSIS — F419 Anxiety disorder, unspecified: Secondary | ICD-10-CM

## 2021-02-17 DIAGNOSIS — N529 Male erectile dysfunction, unspecified: Secondary | ICD-10-CM

## 2021-02-17 LAB — POCT URINALYSIS DIPSTICK
Appearance: NEGATIVE
Bilirubin, UA: NEGATIVE
Blood, UA: NEGATIVE
Glucose, UA: NEGATIVE
Ketones, UA: NEGATIVE
Leukocytes, UA: NEGATIVE
Nitrite, UA: NEGATIVE
Odor: NEGATIVE
Protein, UA: NEGATIVE
Spec Grav, UA: 1.01 (ref 1.010–1.025)
Urobilinogen, UA: 0.2 E.U./dL
pH, UA: 6.5 (ref 5.0–8.0)
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
## 2021-02-17 NOTE — Patient Instructions (Addendum)
It was a pleasure to see you today.  Continue current medications.  Ultrasound has been scheduled to evaluate elevated alkaline phosphatase on recent labs.  No change in medications at present time.

## 2021-03-03 ENCOUNTER — Other Ambulatory Visit: Payer: Self-pay | Admitting: Internal Medicine

## 2021-03-10 ENCOUNTER — Other Ambulatory Visit: Payer: Self-pay

## 2021-03-14 ENCOUNTER — Ambulatory Visit
Admission: RE | Admit: 2021-03-14 | Discharge: 2021-03-14 | Disposition: A | Discharge status: Home / Self Care | Payer: 59 | Source: Ambulatory Visit | Attending: Internal Medicine | Admitting: Internal Medicine

## 2021-03-15 ENCOUNTER — Encounter: Payer: Self-pay | Admitting: Internal Medicine

## 2021-03-15 ENCOUNTER — Other Ambulatory Visit: Payer: Self-pay

## 2021-03-15 ENCOUNTER — Telehealth (INDEPENDENT_AMBULATORY_CARE_PROVIDER_SITE_OTHER): Payer: 59 | Admitting: Internal Medicine

## 2021-03-15 ENCOUNTER — Ambulatory Visit: Payer: 59 | Admitting: Internal Medicine

## 2021-03-15 DIAGNOSIS — K760 Fatty (change of) liver, not elsewhere classified: Secondary | ICD-10-CM

## 2021-03-15 NOTE — Progress Notes (Signed)
   Subjective:    Patient ID: Charles Villarreal, male    DOB: Jun 18, 1966, 55 y.o.   MRN: [id number]  HPI 55 year old Male seen today via interactive audio and video telecommunications due to the coronavirus pandemic.  He had been scheduled for an in person appointment but his wife tested positive for COVID today so he is quarantining at home.  I am at the office and he is at home.  He is agreeable to visit in this format today.  He is identified using 2 identifiers as Charles Villarreal, patient in this practice.  Yesterday he underwent an ultrasound of the abdomen because in late June he had fasting labs showing an alkaline phosphatase of 282.  His SG PT was elevated at 53 but his SGOT was normal at 31.  Bilirubin was normal.  Findings of this study including normal common bile duct diameter.  Liver was diffusely echogenic with no focal hepatic abnormality.  Pancreas was obscured by bowel gas.  Spleen appeared to be borderline to  slightly enlarged.  No abdominal aortic aneurysm noted.  Small cysts noted in the left kidney.  Small cyst noted in the right kidney.  Stone or gallbladder polyp noted which was tiny.  Due to bowel gas it was difficult to be more certain about the pancreas.  I have suggested that patient have a CT of the abdomen and pelvis in the next few weeks.  He will let me know when he would like to do this.  He has remote history of alcohol use and was in treatment at [facility] in 2020.  History of essential hypertension, hyperlipidemia and essential tremor.  History of hyperlipidemia.  History of pulmonary sarcoidosis followed by Dr. Sherene Sires.  Received naltrexone IM every 30 days through [facility].  Takes Seroquel, Cymbalta and BuSpar.    Review of Systems no new complaints     Objective:   Physical Exam  Seen today in no acute distress quarantining at home his wife is tested positive for COVID-19      Assessment & Plan:  Due to elevated alkaline phosphatase  he had ultrasound of the abdomen.  He has hepatic steatosis which is likely related to his history of alcoholism.  He may have a tiny polyp or stone in the gallbladder.  May have borderline to mild splenomegaly.  Also appears to have simple appearing bilateral renal cyst which are benign.  Have suggested that he had a CT of the abdomen and pelvis in the near future to clarify whether or not he truly has splenomegaly, evaluate his gallbladder, and liver.  Pancreas may be better visualized.  He will let me know when he is ready to do this.

## 2021-03-15 NOTE — Patient Instructions (Signed)
Recommending ultrasound of abdomen and pelvis for further evaluation of liver and bile ducts with history of elevated alkaline phosphatase as well as pancreas.

## 2021-03-31 ENCOUNTER — Other Ambulatory Visit: Payer: Self-pay | Admitting: Internal Medicine

## 2021-05-03 ENCOUNTER — Other Ambulatory Visit: Payer: Self-pay | Admitting: Internal Medicine

## 2021-05-30 ENCOUNTER — Other Ambulatory Visit: Payer: Self-pay | Admitting: Internal Medicine

## 2021-06-02 ENCOUNTER — Other Ambulatory Visit: Payer: Self-pay | Admitting: Internal Medicine

## 2021-07-01 ENCOUNTER — Other Ambulatory Visit: Payer: Self-pay | Admitting: Internal Medicine

## 2021-08-01 ENCOUNTER — Other Ambulatory Visit: Payer: Self-pay | Admitting: Internal Medicine

## 2021-09-01 ENCOUNTER — Other Ambulatory Visit: Payer: Self-pay | Admitting: Internal Medicine

## 2021-09-01 NOTE — Telephone Encounter (Signed)
LVM to CB and schedule 6 month with labs prior

## 2021-09-05 ENCOUNTER — Telehealth: Payer: Self-pay | Admitting: Internal Medicine

## 2021-09-05 NOTE — Telephone Encounter (Signed)
LVM on 09/02/2021 to CB to schedule 6 month OV

## 2021-09-05 NOTE — Telephone Encounter (Signed)
LVM to CB ASAP to schedule appointment so we can refill medicatio

## 2021-09-05 NOTE — Telephone Encounter (Signed)
LVM to CB and schedule 6 month OV so we can refill medication

## 2021-09-29 NOTE — Telephone Encounter (Signed)
Schedule appointment for Monday

## 2021-09-30 ENCOUNTER — Other Ambulatory Visit: Payer: Self-pay

## 2021-09-30 ENCOUNTER — Other Ambulatory Visit: Payer: 59 | Admitting: Internal Medicine

## 2021-09-30 DIAGNOSIS — E782 Mixed hyperlipidemia: Secondary | ICD-10-CM

## 2021-09-30 DIAGNOSIS — R748 Abnormal levels of other serum enzymes: Secondary | ICD-10-CM

## 2021-09-30 LAB — COMPLETE METABOLIC PANEL WITH GFR
AG Ratio: 1.8 (calc) (ref 1.0–2.5)
ALT: 29 U/L (ref 9–46)
AST: 17 U/L (ref 10–35)
Albumin: 4.6 g/dL (ref 3.6–5.1)
Alkaline phosphatase (APISO): 80 U/L (ref 35–144)
BUN: 17 mg/dL (ref 7–25)
CO2: 25 mmol/L (ref 20–32)
Calcium: 9.8 mg/dL (ref 8.6–10.3)
Chloride: 105 mmol/L (ref 98–110)
Creat: 1.05 mg/dL (ref 0.70–1.30)
Globulin: 2.5 g/dL (calc) (ref 1.9–3.7)
Glucose, Bld: 94 mg/dL (ref 65–99)
Potassium: 4.1 mmol/L (ref 3.5–5.3)
Sodium: 139 mmol/L (ref 135–146)
Total Bilirubin: 0.5 mg/dL (ref 0.2–1.2)
Total Protein: 7.1 g/dL (ref 6.1–8.1)
eGFR: 83 mL/min/{1.73_m2} (ref >=60)

## 2021-09-30 LAB — LIPID PANEL
Cholesterol: 179 mg/dL (ref <200)
HDL: 46 mg/dL (ref >=40)
LDL Cholesterol (Calc): 115 mg/dL (calc) — ABNORMAL HIGH
Non-HDL Cholesterol (Calc): 133 mg/dL (calc) — ABNORMAL HIGH (ref <130)
Total CHOL/HDL Ratio: 3.9 (calc) (ref <5.0)
Triglycerides: 84 mg/dL (ref <150)

## 2021-09-30 NOTE — Addendum Note (Signed)
Addended by: Jama Flavors on: 09/30/2021 09:59 AM   Modules accepted: Orders

## 2021-09-30 NOTE — Addendum Note (Signed)
Addended by: Jama Flavors on: 09/30/2021 10:01 AM   Modules accepted: Orders

## 2021-10-03 ENCOUNTER — Other Ambulatory Visit: Payer: Self-pay

## 2021-10-03 ENCOUNTER — Other Ambulatory Visit: Payer: Self-pay | Admitting: Internal Medicine

## 2021-10-03 ENCOUNTER — Ambulatory Visit (INDEPENDENT_AMBULATORY_CARE_PROVIDER_SITE_OTHER): Payer: 59 | Admitting: Internal Medicine

## 2021-10-03 ENCOUNTER — Encounter: Payer: Self-pay | Admitting: Internal Medicine

## 2021-10-03 VITALS — BP 120/70 | HR 67 | Temp 97.7°F | Ht 73.0 in | Wt 194.0 lb

## 2021-10-03 DIAGNOSIS — E782 Mixed hyperlipidemia: Secondary | ICD-10-CM

## 2021-10-03 DIAGNOSIS — I10 Essential (primary) hypertension: Secondary | ICD-10-CM

## 2021-10-03 DIAGNOSIS — H6500 Acute serous otitis media, unspecified ear: Secondary | ICD-10-CM
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
## 2021-10-11 ENCOUNTER — Telehealth: Payer: Self-pay | Admitting: Internal Medicine

## 2021-10-11 DIAGNOSIS — H6690 Otitis media, unspecified, unspecified ear: Secondary | ICD-10-CM

## 2021-10-11 MED ORDER — AZITHROMYCIN 250 MG PO TABS: ORAL_TABLET | ORAL | 0 refills | Status: AC

## 2021-10-11 NOTE — Telephone Encounter (Signed)
Charles Villarreal Ivor Messier [phone]  Charles Villarreal stop by the office and said you were going to call in an antibiotic for fluid on his ears, when he was in last Monday.

## 2021-10-11 NOTE — Telephone Encounter (Signed)
Done

## 2021-10-19 ENCOUNTER — Ambulatory Visit (HOSPITAL_COMMUNITY)
Admission: RE | Admit: 2021-10-19 | Discharge: 2021-10-19 | Disposition: A | Discharge status: Home / Self Care | Payer: 59 | Source: Ambulatory Visit | Attending: Internal Medicine | Admitting: Internal Medicine

## 2021-10-19 ENCOUNTER — Other Ambulatory Visit: Payer: Self-pay

## 2021-10-19 ENCOUNTER — Encounter (HOSPITAL_COMMUNITY): Payer: Self-pay

## 2021-10-19 DIAGNOSIS — E782 Mixed hyperlipidemia: Secondary | ICD-10-CM | POA: Insufficient documentation

## 2021-10-20 ENCOUNTER — Telehealth: Payer: Self-pay | Admitting: Internal Medicine

## 2021-10-20 NOTE — Telephone Encounter (Signed)
LVM to CB to schedule an appointment to discuss results

## 2021-10-20 NOTE — Telephone Encounter (Signed)
-----   Message from Margaree Mackintosh, MD sent at 10/19/2021  4:20 PM EST ----- Please book appt to discuss results and next step of increasing statin med and possibly see a cardiologist for non-invasive testing.

## 2021-10-21 NOTE — Telephone Encounter (Signed)
scheduled

## 2021-10-21 NOTE — Telephone Encounter (Signed)
LVM to CB to schedule appointment 

## 2021-10-27 ENCOUNTER — Encounter: Payer: Self-pay | Admitting: Internal Medicine

## 2021-10-27 ENCOUNTER — Other Ambulatory Visit: Payer: Self-pay

## 2021-10-27 ENCOUNTER — Ambulatory Visit (INDEPENDENT_AMBULATORY_CARE_PROVIDER_SITE_OTHER): Payer: 59 | Admitting: Internal Medicine

## 2021-10-27 VITALS — BP 114/78 | HR 66 | Temp 98.2°F | Ht 73.0 in | Wt 196.8 lb

## 2021-10-27 DIAGNOSIS — H6593 Unspecified nonsuppurative otitis media, bilateral: Secondary | ICD-10-CM | POA: Diagnosis not present

## 2021-10-27 DIAGNOSIS — H6690 Otitis media, unspecified, unspecified ear: Secondary | ICD-10-CM | POA: Diagnosis not present

## 2021-10-27 DIAGNOSIS — E782 Mixed hyperlipidemia: Secondary | ICD-10-CM | POA: Diagnosis not present

## 2021-10-27 MED ORDER — DOXYCYCLINE HYCLATE 100 MG PO TABS: 100.0000 mg | ORAL_TABLET | Freq: Two times a day (BID) | ORAL | 0 refills | Status: DC

## 2021-10-27 MED ORDER — METHYLPREDNISOLONE ACETATE 80 MG/ML IJ SUSP
80.0000 mg | Freq: Once | INTRAMUSCULAR | Status: AC
  Administered 2021-10-27: 80 mg via INTRAMUSCULAR

## 2021-10-27 MED ORDER — ROSUVASTATIN CALCIUM 20 MG PO TABS: 20.0000 mg | ORAL_TABLET | Freq: Every day | ORAL | 3 refills | Status: DC

## 2021-10-27 NOTE — Progress Notes (Incomplete)
° °  Subjective:    Patient ID: Charles Villarreal, male    DOB: 1966/05/02, 56 y.o.   MRN: [id number]  HPI 56 year old Male here to discuss recent coronary calcium scoring.    Review of Systems ears still do not feel completely well.  At last visit was treated with Zithromax.  Left ear in particular feels a bit congested/stopped up.  No fever chills or other URI symptoms.     Objective:   Physical Exam TMs are slightly full bilaterally but not red.  He is afebrile.       Assessment & Plan:

## 2021-10-27 NOTE — Patient Instructions (Signed)
Depo-Medrol 80 mg IM given for ear congestion today.  We will make referral to Dr. Winn Jock to discuss recent coronary calcium score and further recommendations.  Continue statin medication.

## 2021-11-05 NOTE — Patient Instructions (Addendum)
Take Zithromax Z-PAK 2 tabs day 1 followed by 1 tab days 2 through 5 for left serous otitis media.  Continue Crestor for hyperlipidemia and amlodipine for hypertension.   CT cardiac scoring ordered.

## 2021-12-02 ENCOUNTER — Other Ambulatory Visit: Payer: Self-pay | Admitting: Internal Medicine

## 2021-12-09 NOTE — Progress Notes (Signed)
CARDIOLOGY CONSULT NOTE  ? ? ? ? ? ?Patient ID: ?Charles Villarreal ?MRN: [id number] ?DOB/AGE: [birth year] 56 y.o. ? ?Admit date: (Not on file) ?Referring Physician: [name] ?Primary Physician: Margaree Mackintosh, MD ?Primary Cardiologist: new ?Reason for Consultation: CAD ? ? ? ?HPI:  56 y.o. referred by Dr Lenord Fellers for CAD. 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 Has a history of ETOH use. Followed by [facility] for sarcoid Has familial tremor on beta blocker  ? ?Lab review with LDL 115 ?Calcium score 10/19/21 was 544 96 th percentile including LM/3VD ? ?No chest pain active. 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 Wife Pieter Partridge also active  ? ?Discussed high calcium score and new target for LDL being 70 or less  ? ?ROS ?All other systems reviewed and negative except as noted above ? ?Past Medical History:  ?Diagnosis Date  ? Allergy   ? occasional  ? Complication of anesthesia   ? aggression  ? Depression   ? GERD (gastroesophageal reflux disease)   ? Gout   ? HSV-1 (herpes simplex virus 1) infection   ? Hyperlipidemia   ? Hypertension   ? Low back pain   ? Sarcoidosis   ? Substance abuse (HCC)   ? in recovery from alcohol  ? Tremor   ?  ?Family History  ?Problem Relation Age of Onset  ? Alcoholism Father   ? Alcoholism Sister   ? Hypertension Mother   ? Alcoholism Other   ? Colon cancer Neg Hx   ? Esophageal cancer Neg Hx   ? Prostate cancer Neg Hx   ? Rectal cancer Neg Hx   ?  ?Social History  ? ?Socioeconomic History  ? Marital status: Married  ?  Spouse name: Not on file  ? Number of children: Not on file  ? Years of education: Not on file  ? Highest education level: Not on file  ?Occupational History  ?  Employer: [employer]   ?Tobacco Use  ? Smoking status: Never  ? Smokeless tobacco: Former  ?  Types: Chew  ?  Quit date: 08/28/1996  ?Vaping Use  ? Vaping Use: Never used  ?Substance and Sexual Activity  ? Alcohol  use: Not Currently  ?  Comment: quit drinking end of march 2020  ? Drug use: No  ? Sexual activity: Not on file  ?Other Topics Concern  ? Not on file  ?Social History Narrative  ? Not on file  ? ?Social Determinants of Health  ? ?Financial Resource Strain: Not on file  ?Food Insecurity: Not on file  ?Transportation Needs: Not on file  ?Physical Activity: Not on file  ?Stress: Not on file  ?Social Connections: Not on file  ?Intimate Partner Violence: Not on file  ?  ?Past Surgical History:  ?Procedure Laterality Date  ? ANTERIOR LAT LUMBAR FUSION N/A 10/09/2017  ? Procedure: Thoracic twelve Corpectomy, Thoracic eleven-Lumbar one  lateral plate,   Anterior Lateral Interbody Fusion with Corpectomy Cage ;  Surgeon: Julio Sicks, MD;  Location: [facility] OR;  Service: Neurosurgery;  Laterality: N/A;  ? APPENDECTOMY    ? BACK SURGERY    ? KNEE ARTHROPLASTY  1995  ? Lt  ? UMBILICAL HERNIA REPAIR    ?  ? ? ?Current Outpatient Medications:  ?  amLODipine (NORVASC) 10 MG tablet, TAKE ONE TABLET EACH DAY, Disp: 90 tablet, Rfl: 0 ?  aspirin EC 81 MG tablet, Take 81 mg by mouth daily., Disp: , Rfl:  ?  busPIRone (BUSPAR) 15 MG tablet, TAKE ONE TABLET THREE TIMES DAILY, Disp: 90 tablet, Rfl: 1 ?  doxycycline (VIBRA-TABS) 100 MG tablet, Take 1 tablet (100 mg total) by mouth 2 (two) times daily., Disp: 20 tablet, Rfl: 0 ?  DULoxetine (CYMBALTA) 60 MG capsule, Take 1 capsule by mouth daily., Disp: , Rfl:  ?  ibuprofen (ADVIL) 800 MG tablet, TAKE ONE TABLET EVERY EIGHT HOURS AS NEEDED, Disp: 90 tablet, Rfl: 1 ?  Melatonin 10 MG TBCR, Take as directed , Disp: 90 tablet, Rfl: 1 ?  methocarbamol (ROBAXIN) 500 MG tablet, TAKE TWO TABLETS TWICE DAILY, Disp: 120 tablet, Rfl: 1 ?  Naltrexone (VIVITROL IM), Inject into the muscle every 30 (thirty) days., Disp: , Rfl:  ?  QUEtiapine (SEROQUEL) 50 MG tablet, Take 25-50 mg by mouth at bedtime as needed., Disp: , Rfl:  ?  rosuvastatin (CRESTOR) 20 MG tablet, Take 1 tablet (20 mg total) by mouth daily.,  Disp: 90 tablet, Rfl: 3 ?  tadalafil (CIALIS) 5 MG tablet, TAKE ONE TABLET EACH DAY AS NEEDED FOR ERECTILE DYSFUNCTION, Disp: 30 tablet, Rfl: 5 ? ? ? ?Physical Exam: ?Blood pressure 128/88, pulse (!) 59, height 6' (1.829 m), weight 198 lb (89.8 kg), SpO2 98 %.   ? ?Affect appropriate ?Healthy:  appears stated age ?HEENT: normal ?Neck supple with no adenopathy ?JVP normal no bruits no thyromegaly ?Lungs clear with no wheezing and good diaphragmatic motion ?Heart:  S1/S2 loud apical MR murmur, no rub, gallop or click ?PMI normal ?Abdomen: benighn, BS positve, no tenderness, no AAA ?no bruit.  No HSM or HJR ?Distal pulses intact with no bruits ?No edema ?Neuro non-focal ?Skin warm and dry ?No muscular weakness ? ? ?Labs: ?  ?Lab Results  ?Component Value Date  ? WBC 5.4 02/14/2021  ? HGB 13.5 02/14/2021  ? HCT 41.6 02/14/2021  ? MCV 90.8 02/14/2021  ? PLT 274 02/14/2021  ? No results for input(s): NA, K, CL, CO2, BUN, CREATININE, CALCIUM, PROT, BILITOT, ALKPHOS, ALT, AST, GLUCOSE in the last 168 hours. ? ?Invalid input(s): LABALBU ?Lab Results  ?Component Value Date  ? CKTOTAL 94 09/25/2017  ?  ?Lab Results  ?Component Value Date  ? CHOL 179 09/30/2021  ? CHOL 157 02/14/2021  ? CHOL 185 08/10/2020  ? ?Lab Results  ?Component Value Date  ? HDL 46 09/30/2021  ? HDL 53 02/14/2021  ? HDL 47 08/10/2020  ? ?Lab Results  ?Component Value Date  ? LDLCALC 115 (H) 09/30/2021  ? LDLCALC 86 02/14/2021  ? LDLCALC 113 (H) 08/10/2020  ? ?Lab Results  ?Component Value Date  ? TRIG 84 09/30/2021  ? TRIG 90 02/14/2021  ? TRIG 142 08/10/2020  ? ?Lab Results  ?Component Value Date  ? CHOLHDL 3.9 09/30/2021  ? CHOLHDL 3.0 02/14/2021  ? CHOLHDL 3.9 08/10/2020  ? ?Lab Results  ?Component Value Date  ? LDLDIRECT 172.2 05/17/2012  ?  ?  ?Radiology: ?No results found. ? ?EKG: SR rate 59 normal  12/16/2021 NSR rate 59 normal  ? ? ?ASSESSMENT AND PLAN:  ? ?CAD: subclinical with high calcium score for age Crestor dose increased to 20 mg on 10/27/21  Given score over 400 will f/u with ETT ?HTN:  Continue Norvasc ?Sarcoid:  pulmonary Note lung over read by radiology on calcium score did not mention any adenopathy he had a positive transbronchial biopsy in 2004 Never required steroid  Rx  ?Tremor:  familial continue beta blocker f/u Dr Tat ?Psych/Depression :  previous ETOH use on Seroqauel and buspar as well as Cymbalta   ?Murmur: not previously described but loud apical MR murmur ? Prolapse or other primary MV abnormality TTE ? ? ?TTE ?ETT ?F/U Lipid / Liver with primary 2 months on higher dose crestor  ? ?F/U cardiology in a year  ? ?Signed: ?Charlton Hawseter Ameyah Bangura ?12/16/2021, 10:10 AM ? ? ?

## 2021-12-16 ENCOUNTER — Other Ambulatory Visit: Payer: Self-pay

## 2021-12-16 ENCOUNTER — Encounter: Payer: Self-pay | Admitting: Cardiovascular Disease

## 2021-12-16 ENCOUNTER — Ambulatory Visit (INDEPENDENT_AMBULATORY_CARE_PROVIDER_SITE_OTHER): Payer: 59 | Admitting: Cardiovascular Disease

## 2021-12-16 VITALS — BP 128/88 | HR 59 | Ht 72.0 in | Wt 198.0 lb

## 2021-12-16 DIAGNOSIS — R011 Cardiac murmur, unspecified: Secondary | ICD-10-CM | POA: Diagnosis not present

## 2021-12-16 DIAGNOSIS — I2510 Atherosclerotic heart disease of native coronary artery without angina pectoris: Secondary | ICD-10-CM | POA: Diagnosis not present

## 2021-12-16 DIAGNOSIS — R931 Abnormal findings on diagnostic imaging of heart and coronary circulation: Secondary | ICD-10-CM

## 2021-12-16 DIAGNOSIS — I2583 Coronary atherosclerosis due to lipid rich plaque: Secondary | ICD-10-CM | POA: Diagnosis not present

## 2021-12-16 NOTE — Patient Instructions (Addendum)
Medication Instructions:  ?Your physician recommends that you continue on your current medications as directed. Please refer to the Current Medication list given to you today. ? ?*If you need a refill on your cardiac medications before your next appointment, please call your pharmacy* ? ?Lab Work: ?If you have labs (blood work) drawn today and your tests are completely normal, you will receive your results only by: ?MyChart Message (if you have MyChart) OR ?A paper copy in the mail ?If you have any lab test that is abnormal or we need to change your treatment, we will call you to review the results. ? ?Testing/Procedures: ?Your physician has requested that you have en exercise stress myoview. For further information please visit [email or web address]. Please follow instruction sheet, as given. ? ?Your physician has requested that you have an echocardiogram. Echocardiography is a painless test that uses sound waves to create images of your heart. It provides your doctor with information about the size and shape of your heart and how well your heart?s chambers and valves are working. This procedure takes approximately one hour. There are no restrictions for this procedure. ? ?Follow-Up: ?At [facility], you and your health needs are our priority.  As part of our continuing mission to provide you with exceptional heart care, we have created designated Provider Care Teams.  These Care Teams include your primary Cardiologist (physician) and Advanced Practice Providers (APPs -  Physician Assistants and Nurse Practitioners) who all work together to provide you with the care you need, when you need it. ? ?We recommend signing up for the patient portal called "MyChart".  Sign up information is provided on this After Visit Summary.  MyChart is used to connect with patients for Virtual Visits (Telemedicine).  Patients are able to view lab/test results, encounter notes, upcoming appointments, etc.  Non-urgent messages can be  sent to your provider as well.   ?To learn more about what you can do with MyChart, go to [email or web address].   ? ?Your next appointment:   ?1 year(s) ? ?The format for your next appointment:   ?In Person ? ?Provider:   ?Jenkins Rouge, MD { ? ? ?Important Information About Sugar ? ? ? ? ?  ?

## 2021-12-16 NOTE — Progress Notes (Signed)
Order placed for attestation for myoview. ?

## 2021-12-20 ENCOUNTER — Telehealth (HOSPITAL_COMMUNITY): Payer: Self-pay | Admitting: *Deleted

## 2021-12-20 NOTE — Telephone Encounter (Signed)
Left message on voicemail per DPR in reference to upcoming appointment scheduled on 12/26/2021 at 10:15 with detailed instructions given per Myocardial Perfusion Study Information Sheet for the test. LM to arrive 15 minutes early, and that it is imperative to arrive on time for appointment to keep from having the test rescheduled. If you need to cancel or reschedule your appointment, please call the office within 24 hours of your appointment. Failure to do so may result in a cancellation of your appointment, and a $50 no show fee. Phone number given for call back for any questions.  ? ?

## 2021-12-26 ENCOUNTER — Ambulatory Visit (HOSPITAL_COMMUNITY): Payer: 59 | Attending: Cardiology

## 2021-12-26 DIAGNOSIS — I2510 Atherosclerotic heart disease of native coronary artery without angina pectoris: Secondary | ICD-10-CM | POA: Insufficient documentation

## 2021-12-26 DIAGNOSIS — I2583 Coronary atherosclerosis due to lipid rich plaque: Secondary | ICD-10-CM | POA: Insufficient documentation

## 2021-12-26 DIAGNOSIS — R931 Abnormal findings on diagnostic imaging of heart and coronary circulation: Secondary | ICD-10-CM | POA: Diagnosis not present

## 2021-12-26 LAB — MYOCARDIAL PERFUSION IMAGING
Angina Index: 0
Duke Treadmill Score: 3
Estimated workload: 10.1
Exercise duration (min): 8 min
LV dias vol: 160 mL (ref 62–150)
LV sys vol: 77 mL
MPHR: 163 {beats}/min
Nuc Stress EF: 52 %
Peak HR: 150 {beats}/min
Percent HR: 92 %
RPE: 18
Rest HR: 80 {beats}/min
Rest Nuclear Isotope Dose: 10.9 mCi
SDS: 2
SRS: 0
SSS: 2
ST Depression (mm): 1 mm
Stress Nuclear Isotope Dose: 31.4 mCi
TID: 0.99

## 2021-12-26 MED ORDER — TECHNETIUM TC 99M TETROFOSMIN IV KIT
31.1000 | PACK | Freq: Once | INTRAVENOUS | Status: AC | PRN
  Administered 2021-12-26: 31.1 via INTRAVENOUS
  Filled 2021-12-26: qty 32

## 2021-12-26 MED ORDER — TECHNETIUM TC 99M TETROFOSMIN IV KIT
10.8000 | PACK | Freq: Once | INTRAVENOUS | Status: AC | PRN
  Administered 2021-12-26: 10.9 via INTRAVENOUS
  Filled 2021-12-26: qty 11

## 2021-12-29 ENCOUNTER — Ambulatory Visit (HOSPITAL_COMMUNITY): Payer: 59 | Attending: Cardiovascular Disease

## 2021-12-29 DIAGNOSIS — I2510 Atherosclerotic heart disease of native coronary artery without angina pectoris: Secondary | ICD-10-CM | POA: Insufficient documentation

## 2021-12-29 DIAGNOSIS — I2583 Coronary atherosclerosis due to lipid rich plaque: Secondary | ICD-10-CM | POA: Diagnosis not present

## 2021-12-29 DIAGNOSIS — R931 Abnormal findings on diagnostic imaging of heart and coronary circulation: Secondary | ICD-10-CM | POA: Diagnosis not present

## 2021-12-29 LAB — ECHOCARDIOGRAM COMPLETE
Area-P 1/2: 3.72 cm2
S' Lateral: 3.3 cm

## 2022-01-05 ENCOUNTER — Other Ambulatory Visit: Payer: Self-pay | Admitting: Internal Medicine

## 2022-02-07 ENCOUNTER — Other Ambulatory Visit: Payer: Self-pay | Admitting: Internal Medicine

## 2022-03-06 ENCOUNTER — Other Ambulatory Visit: Payer: Self-pay | Admitting: Internal Medicine

## 2022-03-13 ENCOUNTER — Other Ambulatory Visit: Payer: 59

## 2022-03-13 ENCOUNTER — Telehealth: Payer: Self-pay

## 2022-03-13 DIAGNOSIS — Z125 Encounter for screening for malignant neoplasm of prostate: Secondary | ICD-10-CM

## 2022-03-13 DIAGNOSIS — E782 Mixed hyperlipidemia: Secondary | ICD-10-CM

## 2022-03-13 DIAGNOSIS — I10 Essential (primary) hypertension: Secondary | ICD-10-CM

## 2022-03-13 NOTE — Telephone Encounter (Signed)
LMOM for patient to call and reschedule labs for physical later this week.

## 2022-03-16 ENCOUNTER — Other Ambulatory Visit: Payer: 59

## 2022-03-16 DIAGNOSIS — I10 Essential (primary) hypertension: Secondary | ICD-10-CM

## 2022-03-16 DIAGNOSIS — E782 Mixed hyperlipidemia: Secondary | ICD-10-CM

## 2022-03-16 DIAGNOSIS — D649 Anemia, unspecified: Secondary | ICD-10-CM

## 2022-03-16 DIAGNOSIS — Z125 Encounter for screening for malignant neoplasm of prostate: Secondary | ICD-10-CM

## 2022-03-17 ENCOUNTER — Ambulatory Visit (INDEPENDENT_AMBULATORY_CARE_PROVIDER_SITE_OTHER): Payer: 59 | Admitting: Internal Medicine

## 2022-03-17 ENCOUNTER — Encounter: Payer: Self-pay | Admitting: Internal Medicine

## 2022-03-17 VITALS — BP 120/76 | HR 61 | Temp 97.4°F | Ht 70.5 in | Wt 199.2 lb

## 2022-03-17 DIAGNOSIS — I10 Essential (primary) hypertension: Secondary | ICD-10-CM

## 2022-03-17 DIAGNOSIS — E538 Deficiency of other specified B group vitamins: Secondary | ICD-10-CM | POA: Diagnosis not present

## 2022-03-17 DIAGNOSIS — G250 Essential tremor: Secondary | ICD-10-CM

## 2022-03-17 DIAGNOSIS — Z0000 Encounter for general adult medical examination without abnormal findings: Secondary | ICD-10-CM | POA: Diagnosis not present

## 2022-03-17 DIAGNOSIS — F419 Anxiety disorder, unspecified: Secondary | ICD-10-CM | POA: Diagnosis not present

## 2022-03-17 DIAGNOSIS — G8929 Other chronic pain: Secondary | ICD-10-CM

## 2022-03-17 DIAGNOSIS — F32A Depression, unspecified: Secondary | ICD-10-CM

## 2022-03-17 DIAGNOSIS — F1021 Alcohol dependence, in remission: Secondary | ICD-10-CM

## 2022-03-17 DIAGNOSIS — D860 Sarcoidosis of lung: Secondary | ICD-10-CM

## 2022-03-17 DIAGNOSIS — N529 Male erectile dysfunction, unspecified: Secondary | ICD-10-CM

## 2022-03-17 DIAGNOSIS — M549 Dorsalgia, unspecified: Secondary | ICD-10-CM

## 2022-03-17 DIAGNOSIS — E782 Mixed hyperlipidemia: Secondary | ICD-10-CM

## 2022-03-17 DIAGNOSIS — I340 Nonrheumatic mitral (valve) insufficiency: Secondary | ICD-10-CM

## 2022-03-17 LAB — POCT URINALYSIS DIPSTICK
Bilirubin, UA: NEGATIVE
Blood, UA: NEGATIVE
Glucose, UA: NEGATIVE
Ketones, UA: NEGATIVE
Leukocytes, UA: NEGATIVE
Nitrite, UA: NEGATIVE
Protein, UA: NEGATIVE
Spec Grav, UA: 1.02 (ref 1.010–1.025)
Urobilinogen, UA: 0.2 E.U./dL
pH, UA: 5 (ref 5.0–8.0)

## 2022-03-17 MED ORDER — LEVOFLOXACIN 500 MG PO TABS: 500.0000 mg | ORAL_TABLET | Freq: Every day | ORAL | 0 refills | Status: AC

## 2022-03-17 NOTE — Addendum Note (Signed)
Addended by: Jama Flavors on: 03/17/2022 10:26 AM   Modules accepted: Orders

## 2022-03-17 NOTE — Addendum Note (Signed)
Addended by: Jama Flavors on: 03/17/2022 10:24 AM   Modules accepted: Orders
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
## 2022-03-18 LAB — CBC WITH DIFFERENTIAL/PLATELET
Absolute Monocytes: 514 cells/uL (ref 200–950)
Basophils Absolute: 38 cells/uL (ref 0–200)
Basophils Relative: 0.8 %
Eosinophils Absolute: 211 cells/uL (ref 15–500)
Eosinophils Relative: 4.4 %
HCT: 38.5 % (ref 38.5–50.0)
Hemoglobin: 13.1 g/dL — ABNORMAL LOW (ref 13.2–17.1)
Lymphs Abs: 1046 cells/uL (ref 850–3900)
MCH: 31.1 pg (ref 27.0–33.0)
MCHC: 34 g/dL (ref 32.0–36.0)
MCV: 91.4 fL (ref 80.0–100.0)
MPV: 10.3 fL (ref 7.5–12.5)
Monocytes Relative: 10.7 %
Neutro Abs: 2990 cells/uL (ref 1500–7800)
Neutrophils Relative %: 62.3 %
Platelets: 233 10*3/uL (ref 140–400)
RBC: 4.21 10*6/uL (ref 4.20–5.80)
RDW: 11.8 % (ref 11.0–15.0)
Total Lymphocyte: 21.8 %
WBC: 4.8 10*3/uL (ref 3.8–10.8)

## 2022-03-18 LAB — IRON, TOTAL/TOTAL IRON BINDING CAP
%SAT: 29 % (calc) (ref 20–48)
Iron: 89 ug/dL (ref 50–180)
TIBC: 312 mcg/dL (calc) (ref 250–425)

## 2022-03-18 LAB — COMPLETE METABOLIC PANEL WITH GFR
AG Ratio: 2 (calc) (ref 1.0–2.5)
ALT: 21 U/L (ref 9–46)
AST: 20 U/L (ref 10–35)
Albumin: 4.4 g/dL (ref 3.6–5.1)
Alkaline phosphatase (APISO): 96 U/L (ref 35–144)
BUN: 21 mg/dL (ref 7–25)
CO2: 26 mmol/L (ref 20–32)
Calcium: 9.6 mg/dL (ref 8.6–10.3)
Chloride: 106 mmol/L (ref 98–110)
Creat: 1.06 mg/dL (ref 0.70–1.30)
Globulin: 2.2 g/dL (calc) (ref 1.9–3.7)
Glucose, Bld: 103 mg/dL — ABNORMAL HIGH (ref 65–99)
Potassium: 4 mmol/L (ref 3.5–5.3)
Sodium: 140 mmol/L (ref 135–146)
Total Bilirubin: 0.3 mg/dL (ref 0.2–1.2)
Total Protein: 6.6 g/dL (ref 6.1–8.1)
eGFR: 82 mL/min/{1.73_m2} (ref >=60)

## 2022-03-18 LAB — LIPID PANEL
Cholesterol: 122 mg/dL (ref <200)
HDL: 44 mg/dL (ref >=40)
LDL Cholesterol (Calc): 56 mg/dL (calc)
Non-HDL Cholesterol (Calc): 78 mg/dL (calc) (ref <130)
Total CHOL/HDL Ratio: 2.8 (calc) (ref <5.0)
Triglycerides: 139 mg/dL (ref <150)

## 2022-03-18 LAB — B12 AND FOLATE PANEL
Folate: 2.9 ng/mL — ABNORMAL LOW
Vitamin B-12: 307 pg/mL (ref 200–1100)

## 2022-03-18 LAB — PSA: PSA: 1.2 ng/mL (ref <=4.00)

## 2022-03-18 LAB — RETICULOCYTES
ABS Retic: 34720 cells/uL (ref 25000–90000)
Retic Ct Pct: 0.8 %

## 2022-04-06 ENCOUNTER — Other Ambulatory Visit: Payer: Self-pay | Admitting: Internal Medicine

## 2022-04-27 NOTE — Patient Instructions (Addendum)
It was a pleasure to see you today.  Continue current medications.  Watch diet.  Try to get a bit more exercise and follow-up here in 1 year or as needed.  Tetanus booster is needed.  Consider COVID booster and RSV vaccines in the Fall along with annual flu vaccine.  Colonoscopy is up-to-date.  Consider Shingrix vaccines as well 2.  Take folate supplement.  Trip out of the country have prescribed Levaquin 500 mg daily for 7 days to have on hand for travel in case you get sick.

## 2022-05-02 ENCOUNTER — Telehealth: Payer: Self-pay | Admitting: *Deleted

## 2022-05-02 DIAGNOSIS — Z006 Encounter for examination for normal comparison and control in clinical research program: Secondary | ICD-10-CM

## 2022-05-02 NOTE — Telephone Encounter (Signed)
I called patient to tell him about Charles Villarreal-Prevention Study.I left message for patient to call me if interested. I will send e-mail to patient with copy of consent.

## 2022-05-09 ENCOUNTER — Other Ambulatory Visit: Payer: Self-pay | Admitting: Internal Medicine

## 2022-08-18 ENCOUNTER — Other Ambulatory Visit: Payer: Self-pay | Admitting: Internal Medicine

## 2022-10-23 ENCOUNTER — Other Ambulatory Visit: Payer: Self-pay | Admitting: Internal Medicine

## 2022-11-27 ENCOUNTER — Other Ambulatory Visit: Payer: Self-pay | Admitting: Internal Medicine

## 2023-01-27 ENCOUNTER — Other Ambulatory Visit: Payer: Self-pay | Admitting: Internal Medicine

## 2023-02-26 ENCOUNTER — Other Ambulatory Visit: Payer: Self-pay | Admitting: Internal Medicine

## 2023-03-31 ENCOUNTER — Other Ambulatory Visit: Payer: Self-pay | Admitting: Family

## 2023-04-05 NOTE — Telephone Encounter (Signed)
LVM to CB and schedule CPE 

## 2023-05-29 ENCOUNTER — Telehealth: Payer: Self-pay | Admitting: Internal Medicine

## 2023-05-29 NOTE — Telephone Encounter (Signed)
Patient left a VM that he needs appointment and refills. I called him back and left him a VM to CB and schedule appointment.

## 2023-05-30 NOTE — Telephone Encounter (Signed)
scheduled

## 2023-05-31 ENCOUNTER — Other Ambulatory Visit: Payer: 59

## 2023-05-31 DIAGNOSIS — I10 Essential (primary) hypertension: Secondary | ICD-10-CM

## 2023-05-31 DIAGNOSIS — Z0000 Encounter for general adult medical examination without abnormal findings: Secondary | ICD-10-CM

## 2023-05-31 DIAGNOSIS — E782 Mixed hyperlipidemia: Secondary | ICD-10-CM

## 2023-05-31 DIAGNOSIS — G250 Essential tremor: Secondary | ICD-10-CM

## 2023-05-31 DIAGNOSIS — F32A Depression, unspecified: Secondary | ICD-10-CM

## 2023-06-01 ENCOUNTER — Ambulatory Visit (INDEPENDENT_AMBULATORY_CARE_PROVIDER_SITE_OTHER): Payer: 59 | Admitting: Internal Medicine

## 2023-06-01 ENCOUNTER — Encounter: Payer: Self-pay | Admitting: Internal Medicine

## 2023-06-01 VITALS — BP 110/68 | HR 63 | Temp 97.9°F | Ht 70.5 in | Wt 193.0 lb

## 2023-06-01 DIAGNOSIS — Z862 Personal history of diseases of the blood and blood-forming organs and certain disorders involving the immune mechanism: Secondary | ICD-10-CM

## 2023-06-01 DIAGNOSIS — Z0000 Encounter for general adult medical examination without abnormal findings: Secondary | ICD-10-CM

## 2023-06-01 DIAGNOSIS — I10 Essential (primary) hypertension: Secondary | ICD-10-CM

## 2023-06-01 DIAGNOSIS — F419 Anxiety disorder, unspecified: Secondary | ICD-10-CM

## 2023-06-01 DIAGNOSIS — E782 Mixed hyperlipidemia: Secondary | ICD-10-CM

## 2023-06-01 DIAGNOSIS — F1021 Alcohol dependence, in remission: Secondary | ICD-10-CM | POA: Diagnosis not present

## 2023-06-01 DIAGNOSIS — H6692 Otitis media, unspecified, left ear: Secondary | ICD-10-CM

## 2023-06-01 DIAGNOSIS — L0592 Pilonidal sinus without abscess: Secondary | ICD-10-CM

## 2023-06-01 DIAGNOSIS — G250 Essential tremor: Secondary | ICD-10-CM | POA: Diagnosis not present

## 2023-06-01 DIAGNOSIS — F32A Depression, unspecified: Secondary | ICD-10-CM

## 2023-06-01 DIAGNOSIS — I340 Nonrheumatic mitral (valve) insufficiency: Secondary | ICD-10-CM

## 2023-06-01 LAB — CBC WITH DIFFERENTIAL/PLATELET
Absolute Monocytes: 439 {cells}/uL (ref 200–950)
Basophils Absolute: 82 {cells}/uL (ref 0–200)
Basophils Relative: 1.6 %
Eosinophils Absolute: 230 {cells}/uL (ref 15–500)
Eosinophils Relative: 4.5 %
HCT: 41.3 % (ref 38.5–50.0)
Hemoglobin: 13.4 g/dL (ref 13.2–17.1)
Lymphs Abs: 1290 {cells}/uL (ref 850–3900)
MCH: 30.1 pg (ref 27.0–33.0)
MCHC: 32.4 g/dL (ref 32.0–36.0)
MCV: 92.8 fL (ref 80.0–100.0)
MPV: 10 fL (ref 7.5–12.5)
Monocytes Relative: 8.6 %
Neutro Abs: 3060 {cells}/uL (ref 1500–7800)
Neutrophils Relative %: 60 %
Platelets: 282 10*3/uL (ref 140–400)
RBC: 4.45 10*6/uL (ref 4.20–5.80)
RDW: 11.8 % (ref 11.0–15.0)
Total Lymphocyte: 25.3 %
WBC: 5.1 10*3/uL (ref 3.8–10.8)

## 2023-06-01 LAB — COMPLETE METABOLIC PANEL WITH GFR
AG Ratio: 1.7 (calc) (ref 1.0–2.5)
ALT: 16 U/L (ref 9–46)
AST: 20 U/L (ref 10–35)
Albumin: 4.2 g/dL (ref 3.6–5.1)
Alkaline phosphatase (APISO): 91 U/L (ref 35–144)
BUN: 15 mg/dL (ref 7–25)
CO2: 27 mmol/L (ref 20–32)
Calcium: 9.4 mg/dL (ref 8.6–10.3)
Chloride: 105 mmol/L (ref 98–110)
Creat: 0.92 mg/dL (ref 0.70–1.30)
Globulin: 2.5 g/dL (ref 1.9–3.7)
Glucose, Bld: 95 mg/dL (ref 65–99)
Potassium: 4.5 mmol/L (ref 3.5–5.3)
Sodium: 142 mmol/L (ref 135–146)
Total Bilirubin: 0.5 mg/dL (ref 0.2–1.2)
Total Protein: 6.7 g/dL (ref 6.1–8.1)
eGFR: 96 mL/min/{1.73_m2} (ref >=60)

## 2023-06-01 LAB — PSA: PSA: 1.26 ng/mL (ref <=4.00)

## 2023-06-01 LAB — LIPID PANEL
Cholesterol: 136 mg/dL (ref <200)
HDL: 49 mg/dL (ref >=40)
LDL Cholesterol (Calc): 71 mg/dL
Non-HDL Cholesterol (Calc): 87 mg/dL (ref <130)
Total CHOL/HDL Ratio: 2.8 (calc) (ref <5.0)
Triglycerides: 78 mg/dL (ref <150)

## 2023-06-01 MED ORDER — AMOXICILLIN 500 MG PO CAPS: 500.0000 mg | ORAL_CAPSULE | Freq: Three times a day (TID) | ORAL | 0 refills | Status: AC
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
## 2023-06-01 NOTE — Patient Instructions (Signed)
Referral to General Surgery for evaluation of pilonidal sinus tract. Has acyte otitis media and sinusitis today to be treated with 10 day course of Amoxicillin 500 mg 3 times daily for 10 days. Continue amlodipine for hypertension. Continue generic Celexa and Buspar.Vaccines discussed. Colonoscopy up to date.Lipid panel is normal. Continue generic Crestor.

## 2023-09-01 ENCOUNTER — Other Ambulatory Visit: Payer: Self-pay | Admitting: Internal Medicine

## 2023-10-02 ENCOUNTER — Other Ambulatory Visit: Payer: Self-pay | Admitting: Internal Medicine

## 2023-12-04 DIAGNOSIS — F1020 Alcohol dependence, uncomplicated: Secondary | ICD-10-CM | POA: Diagnosis not present

## 2023-12-28 ENCOUNTER — Telehealth: Payer: Self-pay

## 2023-12-28 ENCOUNTER — Ambulatory Visit: Payer: Self-pay

## 2023-12-28 ENCOUNTER — Ambulatory Visit (HOSPITAL_BASED_OUTPATIENT_CLINIC_OR_DEPARTMENT_OTHER)
Admission: RE | Admit: 2023-12-28 | Discharge: 2023-12-28 | Disposition: A | Discharge status: Home / Self Care | Source: Ambulatory Visit | Attending: Internal Medicine | Admitting: Internal Medicine

## 2023-12-28 ENCOUNTER — Ambulatory Visit: Admitting: Internal Medicine

## 2023-12-28 VITALS — BP 128/80 | HR 67 | Ht 72.0 in | Wt 180.0 lb

## 2023-12-28 DIAGNOSIS — B349 Viral infection, unspecified: Secondary | ICD-10-CM

## 2023-12-28 DIAGNOSIS — J22 Unspecified acute lower respiratory infection: Secondary | ICD-10-CM | POA: Diagnosis not present

## 2023-12-28 MED ORDER — DOXYCYCLINE HYCLATE 100 MG PO TABS: 100.0000 mg | ORAL_TABLET | Freq: Two times a day (BID) | ORAL | 0 refills | Status: DC

## 2023-12-28 NOTE — Progress Notes (Signed)
 Patient Care Team: Charles Evener, MD as PCP - General (Internal Medicine) Charles Rule, MD as PCP - Cardiology (Cardiology) Tat, Von Grumbling, DO as Consulting Physician (Neurology)  Visit Date: 12/28/23  Subjective:   Chief Complaint  Patient presents with   Nausea    States couple days ago had nausea, chills, sweats, a lot of gas and some diarrhea.   Patient [name], Villarreal DOB:1966/05/21,58 y.o. [name]   58 y.o. Male presents today for acute sick visit with Nausea/Diarrhea; Chills/Sweating. Patient has a past medical history of GERD; Sarcoidosis. Says that a couple of days ago he was in the woods and when he came back he had started having nausea with dry heaving with subsequent onset of chills (no shaking), profuse sweating, gas/belching, and general malaise. Denies cough or burning/stinging w/ urination, but does say his throat is sore and his right ear is achy. Also mentions he fasts intermittently.  Past Medical History:  Diagnosis Date   Allergy    occasional   Complication of anesthesia    aggression   Depression    GERD (gastroesophageal reflux disease)    Gout    HSV-1 (herpes simplex virus 1) infection    Hyperlipidemia    Hypertension    Low back pain    Sarcoidosis    Substance abuse (HCC)    in recovery from alcohol   Tremor   No Known Allergies  Family History  Problem Relation Age of Onset   Alcoholism Father    Alcoholism Sister    Hypertension Mother    Alcoholism Other    Colon cancer Neg Hx    Esophageal cancer Neg Hx    Prostate cancer Neg Hx    Rectal cancer Neg Hx    Social History   Social History Narrative   Not on file   Review of Systems  Constitutional:  Positive for chills, diaphoresis and malaise/fatigue.  HENT:  Positive for congestion, ear pain (right) and sore throat.   Respiratory:  Negative for cough.   Gastrointestinal:  Positive for diarrhea and nausea. Negative for vomiting.     Objective:  Vitals: BP  128/80   Pulse 67   Ht 6' (1.829 m)   Wt 180 lb (81.6 kg)   SpO2 97%   BMI 24.41 kg/m   Physical Exam Vitals and nursing note reviewed.  Constitutional:      General: He is not in acute distress.    Appearance: Normal appearance. He is not ill-appearing.  HENT:     Head: Normocephalic and atraumatic.     Right Ear: Ear canal and external ear normal.     Left Ear: Tympanic membrane, ear canal and external ear normal.     Ears:     Comments: Right Ear seems congested    Mouth/Throat:     Mouth: Mucous membranes are moist.     Pharynx: Oropharynx is clear. No oropharyngeal exudate or posterior oropharyngeal erythema.  Pulmonary:     Effort: Pulmonary effort is normal.     Breath sounds: Normal breath sounds. No wheezing, rhonchi or rales.     Comments: Congestion noted on auscultation Lymphadenopathy:     Cervical: No cervical adenopathy.  Skin:    General: Skin is warm and dry.  Neurological:     Mental Status: He is alert and oriented to person, place, and time. Mental status is at baseline.  Psychiatric:        Mood and Affect: Mood normal.  Behavior: Behavior normal.        Thought Content: Thought content normal.        Judgment: Judgment normal.     Results:  Studies Obtained And Personally Reviewed By Me: Labs:     Component Value Date/Time   NA 142 05/31/2023 1022   K 4.5 05/31/2023 1022   CL 105 05/31/2023 1022   CO2 27 05/31/2023 1022   GLUCOSE 95 05/31/2023 1022   BUN 15 05/31/2023 1022   CREATININE 0.92 05/31/2023 1022   CALCIUM  9.4 05/31/2023 1022   PROT 6.7 05/31/2023 1022   ALBUMIN  4.1 02/03/2018 0046   AST 20 05/31/2023 1022   ALT 16 05/31/2023 1022   ALKPHOS 94 02/03/2018 0046   BILITOT 0.5 05/31/2023 1022   GFRNONAA 89 02/14/2021 1142   GFRAA 103 02/14/2021 1142    Lab Results  Component Value Date   WBC 5.1 05/31/2023   HGB 13.4 05/31/2023   HCT 41.3 05/31/2023   MCV 92.8 05/31/2023   PLT 282 05/31/2023   Lab Results   Component Value Date   CHOL 136 05/31/2023   HDL 49 05/31/2023   LDLCALC 71 05/31/2023   LDLDIRECT 172.2 05/17/2012   TRIG 78 05/31/2023   CHOLHDL 2.8 05/31/2023   Lab Results  Component Value Date   HGBA1C 5.3 08/12/2019    Lab Results  Component Value Date   TSH 2.21 09/25/2017    Assessment & Plan:   Orders Placed This Encounter  Procedures   DG Chest 2 View   CBC   Comprehensive metabolic panel with GFR  No orders of the defined types were placed in this encounter. Acute Lower Respiratory Infection: a couple of days ago he was in the woods and when he came back he had started dry heaving that developed further into chills (no shaking), profuse sweating, gas/belching, and general malaise. Denies cough or burning/stinging w/ urination, but does say his throat is sore and his right ear is achy. Also mentions he fasts intermittently. Given Rocephin 1 g IM today. CXR ordered. Stay well rested, well hydrated, and well nourished. Walk around some to prevent atelectasis. He will return on Monday, 5/05. Concern for LLL pneumonia.   Addendum: CXR is negative for pneumonia. Patient contacted with results. Rest, stay well hydrated. Follow up on Monday. Call sooner if sxs worsen    I,Charles Villarreal,acting as a scribe for Charles Evener, MD.,have documented all relevant documentation on the behalf of Charles Evener, MD,as directed by  Charles Evener, MD while in the presence of Charles Evener, MD.   I, Charles Evener, MD, have reviewed all documentation for this visit. The documentation on 12/30/23 for the exam, diagnosis, procedures, and orders are all accurate and complete.

## 2023-12-28 NOTE — Telephone Encounter (Signed)
 Copied from CRM [id number]. Topic: Clinical - Red Word Triage >> Dec 28, 2023  8:47 AM Elle L wrote: Red Word that prompted transfer to Nurse Triage: The patient is having chills, hot flashes, and sweating profusely that started 36 hours ago. He has intense stomach pain, gas and diarrhea.   Chief Complaint: Diarrhea, chills, gas Symptoms: Above Frequency: 3 days Pertinent Negatives: Patient denies  Disposition: [] ED /[] Urgent Care (no appt availability in office) / [x] Appointment(In office/virtual)/ []  [facility]/ [] Home Care/ [] Refused Recommended Disposition /[] [facility]/ []  Follow-up with PCP Additional Notes: agrees with appointment.  Reason for Disposition  [1] MODERATE diarrhea (e.g., 4-6 times / day more than normal) AND [2] present > 48 hours (2 days)  Answer Assessment - Initial Assessment Questions 1. DIARRHEA SEVERITY: "How bad is the diarrhea?" "How many more stools have you had in the past 24 hours than normal?"    - NO DIARRHEA (SCALE 0)   - MILD (SCALE 1-3): Few loose or mushy BMs; increase of 1-3 stools over normal daily number of stools; mild increase in ostomy output.   -  MODERATE (SCALE 4-7): Increase of 4-6 stools daily over normal; moderate increase in ostomy output.   -  SEVERE (SCALE 8-10; OR "WORST POSSIBLE"): Increase of 7 or more stools daily over normal; moderate increase in ostomy output; incontinence.     3-5 2. ONSET: "When did the diarrhea begin?"      36 hours 3. BM CONSISTENCY: "How loose or watery is the diarrhea?"      Watery 4. VOMITING: "Are you also vomiting?" If Yes, ask: "How many times in the past 24 hours?"      no 5. ABDOMEN PAIN: "Are you having any abdomen pain?" If Yes, ask: "What does it feel like?" (e.g., crampy, dull, intermittent, constant)      cramps 6. ABDOMEN PAIN SEVERITY: If present, ask: "How bad is the pain?"  (e.g., Scale 1-10; mild, moderate, or severe)   - MILD (1-3): doesn't interfere with normal  activities, abdomen soft and not tender to touch    - MODERATE (4-7): interferes with normal activities or awakens from sleep, abdomen tender to touch    - SEVERE (8-10): excruciating pain, doubled over, unable to do any normal activities       Moderate 7. ORAL INTAKE: If vomiting, "Have you been able to drink liquids?" "How much liquids have you had in the past 24 hours?"     Yes 8. HYDRATION: "Any signs of dehydration?" (e.g., dry mouth [not just dry lips], too weak to stand, dizziness, new weight loss) "When did you last urinate?"     no 9. EXPOSURE: "Have you traveled to a foreign country recently?" "Have you been exposed to anyone with diarrhea?" "Could you have eaten any food that was spoiled?"     no 10. ANTIBIOTIC USE: "Are you taking antibiotics now or have you taken antibiotics in the past 2 months?"       no 11. OTHER SYMPTOMS: "Do you have any other symptoms?" (e.g., fever, blood in stool)       Chills, gas 12. PREGNANCY: "Is there any chance you are pregnant?" "When was your last menstrual period?"       N/a  Protocols used: [facility]

## 2023-12-28 NOTE — Telephone Encounter (Signed)
 Called patient and informed him that Dr. Liane Redman will send in an oral antibiotic.

## 2023-12-28 NOTE — Telephone Encounter (Signed)
 Appointment 12/28/2023 @ 3:30pm

## 2023-12-29 LAB — COMPREHENSIVE METABOLIC PANEL WITH GFR
AG Ratio: 1.9 (calc) (ref 1.0–2.5)
ALT: 15 U/L (ref 9–46)
AST: 16 U/L (ref 10–35)
Albumin: 5 g/dL (ref 3.6–5.1)
Alkaline phosphatase (APISO): 92 U/L (ref 35–144)
BUN: 14 mg/dL (ref 7–25)
CO2: 24 mmol/L (ref 20–32)
Calcium: 10.1 mg/dL (ref 8.6–10.3)
Chloride: 101 mmol/L (ref 98–110)
Creat: 1.02 mg/dL (ref 0.70–1.30)
Globulin: 2.7 g/dL (ref 1.9–3.7)
Glucose, Bld: 86 mg/dL (ref 65–99)
Potassium: 4 mmol/L (ref 3.5–5.3)
Sodium: 137 mmol/L (ref 135–146)
Total Bilirubin: 0.7 mg/dL (ref 0.2–1.2)
Total Protein: 7.7 g/dL (ref 6.1–8.1)
eGFR: 85 mL/min/{1.73_m2} (ref >=60)

## 2023-12-29 LAB — CBC
HCT: 44.1 % (ref 38.5–50.0)
Hemoglobin: 14.9 g/dL (ref 13.2–17.1)
MCH: 30.2 pg (ref 27.0–33.0)
MCHC: 33.8 g/dL (ref 32.0–36.0)
MCV: 89.5 fL (ref 80.0–100.0)
MPV: 10 fL (ref 7.5–12.5)
Platelets: 405 10*3/uL — ABNORMAL HIGH (ref 140–400)
RBC: 4.93 10*6/uL (ref 4.20–5.80)
RDW: 11.5 % (ref 11.0–15.0)
WBC: 9.1 10*3/uL (ref 3.8–10.8)

## 2023-12-30 ENCOUNTER — Encounter: Payer: Self-pay | Admitting: Internal Medicine

## 2023-12-30 NOTE — Patient Instructions (Signed)
 Rest and stay well hydrated. Call if symptoms worsen. Take Doxycycline  100 mg twice daily. Follow up on Monday.

## 2023-12-31 ENCOUNTER — Ambulatory Visit: Admitting: Internal Medicine

## 2023-12-31 ENCOUNTER — Encounter: Payer: Self-pay | Admitting: Internal Medicine

## 2023-12-31 VITALS — Ht 72.0 in

## 2023-12-31 DIAGNOSIS — I10 Essential (primary) hypertension: Secondary | ICD-10-CM

## 2023-12-31 DIAGNOSIS — Z1329 Encounter for screening for other suspected endocrine disorder: Secondary | ICD-10-CM

## 2023-12-31 DIAGNOSIS — E782 Mixed hyperlipidemia: Secondary | ICD-10-CM

## 2023-12-31 DIAGNOSIS — J22 Unspecified acute lower respiratory infection: Secondary | ICD-10-CM

## 2023-12-31 DIAGNOSIS — R7309 Other abnormal glucose: Secondary | ICD-10-CM | POA: Diagnosis not present

## 2023-12-31 NOTE — Progress Notes (Shared)
 Patient Care Team: Sylvan Evener, MD as PCP - General (Internal Medicine) Loyde Rule, MD as PCP - Cardiology (Cardiology) Tat, Von Grumbling, DO as Consulting Physician (Neurology)  Visit Date: 12/31/23  Subjective:   Chief Complaint  Patient presents with  . xray follow up  Patient TF:TDDU Charles Villarreal, Charles Villarreal DOB:02/17/1966,58 y.o. KGU:[id number]   58 y.o. Male presents today for 3 day follow-up for Acute LRI. Seen on Friday 5/02 for nausea, chills, diaphoresis, gas/belching, general malaise, sore throat, and right otalgia. Some congestion was heard within the left lower lung, so CXR was ordered to r/o PNA. Was started on Doxycycline  100 mg twice daily x10 and given 1 g Rocephin IM in-office. CXR on 12/28/2023 showed no acute cardiopulmonary abnormality.  History of Hypertension treated with Amlodipine  10 mg daily. History of Hyperlipidemia treated with  Rosuvastatin  20 mg daily.  Past Medical History:  Diagnosis Date  . Allergy    occasional  . Complication of anesthesia    aggression  . Depression   . GERD (gastroesophageal reflux disease)   . Gout   . HSV-1 (herpes simplex virus 1) infection   . Hyperlipidemia   . Hypertension   . Low back pain   . Sarcoidosis   . Substance abuse (HCC)    in recovery from alcohol  . Tremor   No Known Allergies  Family History  Problem Relation Age of Onset  . Alcoholism Father   . Alcoholism Sister   . Hypertension Mother   . Alcoholism Other   . Colon cancer Neg Hx   . Esophageal cancer Neg Hx   . Prostate cancer Neg Hx   . Rectal cancer Neg Hx    Social Hx: Married .Has 3 adult daughters. 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  Review of Systems  Constitutional:  Positive for chills, diaphoresis and malaise/fatigue.  HENT:  Positive for congestion, ear pain (right) and sore throat.   Respiratory:  Negative for cough.   Gastrointestinal:  Positive for diarrhea and nausea. Negative for vomiting.   ROS pulled from 5/02 note as this is a direct f/u from that visit.    Objective:  Vitals: Ht 6' (1.829 m)   BMI 24.41 kg/m   Physical Exam Vitals and nursing note reviewed.  Constitutional:      General: He is not in acute distress.    Appearance: Normal appearance. He is not ill-appearing.  HENT:     Head: Normocephalic and atraumatic.  Pulmonary:     Effort: Pulmonary effort is normal.  Skin:    General: Skin is warm and dry.  Neurological:     Mental Status: He is alert and oriented to person, place, and time. Mental status is at baseline.  Psychiatric:        Mood and Affect: Mood normal.        Behavior: Behavior normal.        Thought Content: Thought content normal.        Judgment: Judgment normal.    Results:  Studies Obtained And Personally Reviewed By Me:  CHEST - 2 VIEW 12/28/2023  CLINICAL DATA:  fever chills, rales, left lower lobe   COMPARISON:  October 09, 2017   FINDINGS: No focal airspace consolidation, pleural effusion, or pneumothorax. No cardiomegaly. Tortuous aorta with aortic atherosclerosis. No acute fracture or destructive lesion. Multilevel degenerative disc disease of the spine. Fusion hardware along the lower thoracic spine.   IMPRESSION: No acute cardiopulmonary abnormality.  Labs:     Component Value Date/Time   NA 137 12/28/2023 1608   K 4.0 12/28/2023 1608   CL 101 12/28/2023 1608   CO2 24 12/28/2023 1608   GLUCOSE 86 12/28/2023 1608   BUN 14 12/28/2023 1608   CREATININE 1.02 12/28/2023 1608   CALCIUM  10.1 12/28/2023 1608   PROT 7.7 12/28/2023 1608   ALBUMIN  4.1 02/03/2018 0046   AST 16 12/28/2023 1608   ALT 15 12/28/2023 1608   ALKPHOS 94 02/03/2018 0046   BILITOT 0.7 12/28/2023 1608   GFRNONAA 89 02/14/2021 1142   GFRAA 103 02/14/2021 1142    Lab Results  Component Value Date   WBC 9.1 12/28/2023   HGB 14.9 12/28/2023   HCT 44.1 12/28/2023   MCV 89.5 12/28/2023   PLT 405 (H) 12/28/2023   Lab Results   Component Value Date   CHOL 136 05/31/2023   HDL 49 05/31/2023   LDLCALC 71 05/31/2023   LDLDIRECT 172.2 05/17/2012   TRIG 78 05/31/2023   CHOLHDL 2.8 05/31/2023   Lab Results  Component Value Date   HGBA1C 5.3 08/12/2019    Lab Results  Component Value Date   TSH 2.21 09/25/2017    Lab Results  Component Value Date   PSA 1.26 05/31/2023   PSA 1.20 03/16/2022   PSA 1.09 02/14/2021     Assessment & Plan:   Orders Placed This Encounter  Procedures  . TSH  . Glucose, Random  . Hemoglobin A1c  Acute Lower Respiratory Infection: seen on Friday 5/02 for nausea, chills, diaphoresis, general malaise; some congestion was heard within the left lower lung, so CXR was ordered to r/o PNA. Was started on Doxycycline  100 mg twice daily x10 and given 1 g Rocephin IM in-office. CXR on 12/28/2023 showed no acute cardiopulmonary abnormality.  Hypertension treated with Amlodipine  10 mg daily.   Hyperlipidemia treated with  Rosuvastatin  20 mg daily.   Return in 10 days (on 01/10/2024).    I,Emily Lagle,acting as a Neurosurgeon for Sylvan Evener, MD.,have documented all relevant documentation on the behalf of Sylvan Evener, MD,as directed by  Sylvan Evener, MD while in the presence of Sylvan Evener, MD.   ***

## 2024-01-01 ENCOUNTER — Encounter: Payer: Self-pay | Admitting: Internal Medicine

## 2024-01-01 LAB — GLUCOSE, RANDOM: Glucose, Plasma: 91 mg/dL (ref 65–139)

## 2024-01-01 LAB — HEMOGLOBIN A1C
Hgb A1c MFr Bld: 5.7 % — ABNORMAL HIGH (ref <5.7)
Mean Plasma Glucose: 117 mg/dL
eAG (mmol/L): 6.5 mmol/L

## 2024-01-01 LAB — TSH: TSH: 0.78 m[IU]/L (ref 0.40–4.50)

## 2024-01-01 NOTE — Patient Instructions (Addendum)
 Complete course of Doxycycline  as prescribed. Has mild glucose intolerance which can be treated with diet. Continue Rosuvastatin  for hyperlipidemia Continue amlodipine .

## 2024-01-08 NOTE — Progress Notes (Signed)
 Charles Care Team: Sylvan Evener, MD as PCP - General (Internal Medicine) Loyde Rule, MD as PCP - Cardiology (Cardiology) Tat, Von Grumbling, DO as Consulting Physician (Neurology)  Visit Date: 01/10/24  Subjective:   Chief Complaint  Charles presents with   PHQ-9 12 Week Follow-up  Charles Villarreal,Male DOB:[birth year],58 y.o. [name]  58 y.o.Male presents today for 1 week follow-up for Fatigue; Acute Lower Respiratory Infection. Seen on 12/31/2023 for a respiratory infection he had also been seen for on the 2nd. In that visit he mentioned that he's been feeling fatigued lately. TSH done 5/05 normal. Today he says that he is feeling much better, though he did continue sweating until 2 nights ago.  Past Medical History:  Diagnosis Date   Allergy    occasional   Complication of anesthesia    aggression   Depression    GERD (gastroesophageal reflux disease)    Gout    HSV-1 (herpes simplex virus 1) infection    Hyperlipidemia    Hypertension    Low back pain    Sarcoidosis    Substance abuse (HCC)    in recovery from alcohol   Tremor     No Known Allergies  Family History  Problem Relation Age of Onset   Alcoholism Father    Alcoholism Sister    Hypertension Mother    Alcoholism Other    Colon cancer Neg Hx    Esophageal cancer Neg Hx    Prostate cancer Neg Hx    Rectal cancer Neg Hx    Social History   Social History Narrative   Not on file   Review of Systems  Constitutional:  Negative for chills, diaphoresis (sweating stopped 2 nights ago), fever and malaise/fatigue.  HENT:  Negative for congestion, ear pain and sore throat.   Respiratory:  Negative for cough and sputum production.   Gastrointestinal:  Negative for diarrhea, nausea and vomiting.  Neurological:  Negative for headaches.     Objective:  Vitals: BP 120/70   Pulse 78   Ht 6' (1.829 m)   Wt 180 lb (81.6 kg)   SpO2 98%   BMI 24.41 kg/m   Physical Exam Constitutional:       General: He is not in acute distress.    Appearance: Normal appearance. He is not ill-appearing.  HENT:     Head: Normocephalic and atraumatic.  Pulmonary:     Effort: Pulmonary effort is normal. No respiratory distress.     Breath sounds: Normal breath sounds. No decreased breath sounds, wheezing, rhonchi or rales.  Skin:    General: Skin is warm and dry.  Neurological:     Mental Status: He is alert and oriented to person, place, and time. Mental status is at baseline.  Psychiatric:        Mood and Affect: Mood normal.        Behavior: Behavior normal.        Thought Content: Thought content normal.        Judgment: Judgment normal.     Results:  Studies Obtained And Personally Reviewed By Me: CHEST - 2 VIEW  12/28/2023  COMPARISON:  October 09, 2017   FINDINGS: No focal airspace consolidation, pleural effusion, or pneumothorax. No cardiomegaly. Tortuous aorta with aortic atherosclerosis. No acute fracture or destructive lesion. Multilevel degenerative disc disease of the spine. Fusion hardware along the lower thoracic spine.   IMPRESSION: No acute cardiopulmonary abnormality.  Labs:     Component Value  Date/Time   NA 137 12/28/2023 1608   K 4.0 12/28/2023 1608   CL 101 12/28/2023 1608   CO2 24 12/28/2023 1608   GLUCOSE 86 12/28/2023 1608   BUN 14 12/28/2023 1608   CREATININE 1.02 12/28/2023 1608   CALCIUM  10.1 12/28/2023 1608   PROT 7.7 12/28/2023 1608   ALBUMIN  4.1 02/03/2018 0046   AST 16 12/28/2023 1608   ALT 15 12/28/2023 1608   ALKPHOS 94 02/03/2018 0046   BILITOT 0.7 12/28/2023 1608   GFRNONAA 89 02/14/2021 1142   GFRAA 103 02/14/2021 1142    Lab Results  Component Value Date   WBC 9.1 12/28/2023   HGB 14.9 12/28/2023   HCT 44.1 12/28/2023   MCV 89.5 12/28/2023   PLT 405 (H) 12/28/2023   Lab Results  Component Value Date   CHOL 136 05/31/2023   HDL 49 05/31/2023   LDLCALC 71 05/31/2023   LDLDIRECT 172.2 05/17/2012   TRIG 78 05/31/2023    CHOLHDL 2.8 05/31/2023   Lab Results  Component Value Date   HGBA1C 5.7 (H) 12/31/2023    Lab Results  Component Value Date   TSH 0.78 12/31/2023    Lab Results  Component Value Date   PSA 1.26 05/31/2023   PSA 1.20 03/16/2022   PSA 1.09 02/14/2021     Assessment & Plan:  Acute Lower Respiratory Infection: seen on 12/31/2023 for a respiratory infection he had also been seen for on the 2nd. Today he says that he is feeling much better, though he did continue sweating until 2 nights ago.   Fatigue: mentioned on 5/05 that he's been feeling fatigued lately. 12/31/2023 TSH normal.    Plan: Return October 2025 for health maintenance exam.     I,Emily Lagle,acting as a scribe for Sylvan Evener, MD.,have documented all relevant documentation on the behalf of Sylvan Evener, MD,as directed by  Sylvan Evener, MD while in the presence of Sylvan Evener, MD.   I, Sylvan Evener, MD, have reviewed all documentation for this visit. The documentation on 01/21/24 for the exam, diagnosis, procedures, and orders are all accurate and complete.

## 2024-01-10 ENCOUNTER — Encounter: Payer: Self-pay | Admitting: Internal Medicine

## 2024-01-10 ENCOUNTER — Ambulatory Visit: Admitting: Internal Medicine

## 2024-01-10 VITALS — BP 120/70 | HR 78 | Ht 72.0 in | Wt 180.0 lb

## 2024-01-10 DIAGNOSIS — R5383 Other fatigue: Secondary | ICD-10-CM

## 2024-01-10 DIAGNOSIS — J22 Unspecified acute lower respiratory infection: Secondary | ICD-10-CM

## 2024-01-10 DIAGNOSIS — I10 Essential (primary) hypertension: Secondary | ICD-10-CM

## 2024-01-21 NOTE — Patient Instructions (Signed)
 Patient is feeling better. Finish course of antibiotics. TSH checked in early May is normal. Vaccines mentioned to pt.

## 2024-01-22 DIAGNOSIS — F1020 Alcohol dependence, uncomplicated: Secondary | ICD-10-CM | POA: Diagnosis not present

## 2024-03-11 ENCOUNTER — Other Ambulatory Visit: Payer: Self-pay | Admitting: Internal Medicine

## 2024-03-24 ENCOUNTER — Encounter: Payer: Self-pay | Admitting: Internal Medicine

## 2024-03-24 ENCOUNTER — Ambulatory Visit: Admitting: Internal Medicine

## 2024-03-24 VITALS — BP 120/80 | HR 71 | Temp 98.2°F | Ht 72.0 in | Wt 182.0 lb

## 2024-03-24 DIAGNOSIS — R61 Generalized hyperhidrosis: Secondary | ICD-10-CM | POA: Diagnosis not present

## 2024-03-24 DIAGNOSIS — Z862 Personal history of diseases of the blood and blood-forming organs and certain disorders involving the immune mechanism: Secondary | ICD-10-CM

## 2024-03-24 DIAGNOSIS — E782 Mixed hyperlipidemia: Secondary | ICD-10-CM

## 2024-03-24 DIAGNOSIS — R000 Tachycardia, unspecified: Secondary | ICD-10-CM | POA: Diagnosis not present

## 2024-03-24 DIAGNOSIS — I10 Essential (primary) hypertension: Secondary | ICD-10-CM

## 2024-03-24 NOTE — Progress Notes (Signed)
 Patient Care Team: Charles Ronal PARAS, MD as PCP - General (Internal Medicine) Delford Maude BROCKS, MD as PCP - Cardiology (Cardiology) Tat, Asberry RAMAN, DO as Consulting Physician (Neurology)  Visit Date: 03/24/24  Subjective:   Chief Complaint  Patient presents with   Night Sweats    Headache, night sweats going on since he had pneumonia. Waking up 4-5 times a night soaked in sweat and has to change clothes.    Patient PI:Gnyw Breken, Nazari DOB:[birth year],58 y.o. [id number]   58 y.o.Male presents today for concerns of Night Sweats; Headache. Patient has a past medical history of Impaired Glucose Tolerance w/ 12/2023 HgbA1c 5.7 and Glucose WNL at 86; seen for annual visit in 05/2023 w/ Thyroid, Kidney Functions, C-Met, and CBC all normal except for Platelets 405K; and Sarcoidosis - had flare-up 2011 w/ symptoms of: cough, sweats, fatigue - and this was being followed by Pulmonology, Dr. Darlean until 2020. As mentioned above, his symptoms have been ongoing since May when he was seen for an acute lower respiratory infection. His CXR at that time did not see any acute cardiopulmonary abnormalities indicating PNA. Denies out-of-country travel (did go to [place] for a wedding), recent vaccinations, or symptoms of decreased appetite, weight loss, joint pain, or palpitations. He is not vaccinated against PNA, Hepatitis B, or Shingles, and is due to update his Tdap.  Past Medical History:  Diagnosis Date   Allergy    occasional   Complication of anesthesia    aggression   Depression    GERD (gastroesophageal reflux disease)    Gout    HSV-1 (herpes simplex virus 1) infection    Hyperlipidemia    Hypertension    Low back pain    Sarcoidosis    Substance abuse (HCC)    in recovery from alcohol   Tremor   No Known Allergies  Family History  Problem Relation Age of Onset   Alcoholism Father    Alcoholism Sister    Hypertension Mother    Alcoholism Other    Colon cancer Neg Hx    Esophageal cancer  Neg Hx    Prostate cancer Neg Hx    Rectal cancer Neg Hx    Social hx: Married with 3 wonderful daughters.  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 Had alcohol use disorder years ago and has been sober now for many years.  Review of Systems  Constitutional:  Positive for diaphoresis. Negative for weight loss.       (-) Decreased appetite  Cardiovascular:  Negative for palpitations.  Musculoskeletal:  Negative for joint pain.  Neurological:  Positive for headaches.     Objective:  Vitals: BP 120/80   Pulse 71   Temp 98.2 F (36.8 C) (Tympanic)   Ht 6' (1.829 m)   Wt 182 lb (82.6 kg)   SpO2 97%   BMI 24.68 kg/m   Physical Exam Vitals and nursing note reviewed.  Constitutional:      General: He is not in acute distress.    Appearance: Normal appearance. He is not ill-appearing.  HENT:     Head: Normocephalic and atraumatic.     Right Ear: Tympanic membrane, ear canal and external ear normal.     Left Ear: Tympanic membrane, ear canal and external ear normal.     Mouth/Throat:     Mouth: Mucous membranes are moist.     Pharynx: Oropharynx is clear. No oropharyngeal exudate or posterior oropharyngeal erythema.  Pulmonary:  Effort: Pulmonary effort is normal.     Breath sounds: Normal breath sounds. No wheezing, rhonchi or rales.  Lymphadenopathy:     Cervical: No cervical adenopathy.  Skin:    General: Skin is warm and dry.  Neurological:     Mental Status: He is alert and oriented to person, place, and time. Mental status is at baseline.  Psychiatric:        Mood and Affect: Mood normal.        Behavior: Behavior normal.        Thought Content: Thought content normal.        Judgment: Judgment normal.     Results:  Studies Obtained And Personally Reviewed By Me:  CHEST - 2 VIEW 12/28/2023  CLINICAL DATA:  fever chills, rales, left lower lobe    COMPARISON:  October 09, 2017   FINDINGS: No focal airspace consolidation, pleural effusion,  or pneumothorax. No cardiomegaly. Tortuous aorta with aortic atherosclerosis. No acute fracture or destructive lesion. Multilevel degenerative disc disease of the spine. Fusion hardware along the lower thoracic spine.   IMPRESSION: No acute cardiopulmonary abnormality.   Labs:     Component Value Date/Time   NA 137 12/28/2023 1608   K 4.0 12/28/2023 1608   CL 101 12/28/2023 1608   CO2 24 12/28/2023 1608   GLUCOSE 86 12/28/2023 1608   BUN 14 12/28/2023 1608   CREATININE 1.02 12/28/2023 1608   CALCIUM  10.1 12/28/2023 1608   PROT 7.7 12/28/2023 1608   ALBUMIN  4.1 02/03/2018 0046   AST 16 12/28/2023 1608   ALT 15 12/28/2023 1608   ALKPHOS 94 02/03/2018 0046   BILITOT 0.7 12/28/2023 1608   GFRNONAA 89 02/14/2021 1142   GFRAA 103 02/14/2021 1142    Lab Results  Component Value Date   WBC 9.1 12/28/2023   HGB 14.9 12/28/2023   HCT 44.1 12/28/2023   MCV 89.5 12/28/2023   PLT 405 (H) 12/28/2023   Lab Results  Component Value Date   CHOL 136 05/31/2023   HDL 49 05/31/2023   LDLCALC 71 05/31/2023   LDLDIRECT 172.2 05/17/2012   TRIG 78 05/31/2023   CHOLHDL 2.8 05/31/2023   Lab Results  Component Value Date   HGBA1C 5.7 (H) 12/31/2023    Lab Results  Component Value Date   TSH 0.78 12/31/2023    Assessment & Plan:   Orders Placed This Encounter  Procedures   CT Chest W Contrast   CBC with Differential/Platelet   COMPLETE METABOLIC PANEL WITHOUT GFR   PSA   TSH   Sedimentation rate   ANA   Testosterone    T4, free   Night Sweats: past medical history of Sarcoidosis - had flare-up 2011 w/ symptoms of: cough, sweats, fatigue - and this was being followed by Pulmonology, Dr. Darlean until 2020.   His recent symptoms have been ongoing since May when he was seen here for an acute lower respiratory infection. His CXR at that time did not see any acute cardiopulmonary abnormalities indicating PNA.   Reportedly no out-of-country travel (did go to [place] for a wedding),  recent vaccinations, or symptoms of decreased appetite, weight loss, joint pain, or palpitations.   He is not vaccinated against PNA, Hepatitis B, or Shingles, and is due to update his Tdap.  With  hypercalcemia and hx of respiratory symptoms developing  in May wondering if sarcoid is re-activating?   Reviewed annual visit in 05/2023 w/ Thyroid, Kidney Functions, C-Met, and CBC all normal except for Platelets  74K.  Plan: Ordering CT Chest W Contrast, CBC with Differential/Platelet, COMPLETE METABOLIC PANEL WITHOUT GFR, PSA, TSH, Sed Rate, ANA, Testosterone , and Free T4.  Impaired Glucose Tolerance w/ 12/2023 HgbA1c 5.7% and Glucose normal at 86.  Mixed hyperlipidemia treated with Crestor  20 mg daily  HTN treated with amlodipine   Anxiety treated with Buspar and Cymbalta  Hx of mitral regurgitation seen by Dr, Delford in 2023. No chest pain or SOB  Labs today reviewed: TSH normal, no anemia, sed rate is 9, glucose and kidney functions are normal. CBC is normal. PSA is normal. Calcium  is 10.4 and was 10.1 in May 2025. Elevated calcium  could go along with Sarcoidosis. Chest CT 2023 for coronary calcium  scoring made no mention of Sarcoidosis.     I,Emily Lagle,acting as a Neurosurgeon for Ronal JINNY Hailstone, MD.,have documented all relevant documentation on the behalf of Ronal JINNY Hailstone, MD,as directed by  Ronal JINNY Hailstone, MD while in the presence of Ronal JINNY Hailstone, MD.   I, Ronal JINNY Hailstone, MD, have reviewed all documentation for this visit. The documentation on 03/26/24 for the exam, diagnosis, procedures, and orders are all accurate and complete.

## 2024-03-25 ENCOUNTER — Ambulatory Visit: Payer: Self-pay | Admitting: Internal Medicine

## 2024-03-26 ENCOUNTER — Encounter: Payer: Self-pay | Admitting: Internal Medicine

## 2024-03-26 LAB — CBC WITH DIFFERENTIAL/PLATELET
Absolute Lymphocytes: 1985 {cells}/uL (ref 850–3900)
Absolute Monocytes: 605 {cells}/uL (ref 200–950)
Basophils Absolute: 80 {cells}/uL (ref 0–200)
Basophils Relative: 0.9 %
Eosinophils Absolute: 178 {cells}/uL (ref 15–500)
Eosinophils Relative: 2 %
HCT: 44.7 % (ref 38.5–50.0)
Hemoglobin: 14.5 g/dL (ref 13.2–17.1)
MCH: 30.1 pg (ref 27.0–33.0)
MCHC: 32.4 g/dL (ref 32.0–36.0)
MCV: 92.9 fL (ref 80.0–100.0)
MPV: 10.2 fL (ref 7.5–12.5)
Monocytes Relative: 6.8 %
Neutro Abs: 6052 {cells}/uL (ref 1500–7800)
Neutrophils Relative %: 68 %
Platelets: 294 Thousand/uL (ref 140–400)
RBC: 4.81 Million/uL (ref 4.20–5.80)
RDW: 11.8 % (ref 11.0–15.0)
Total Lymphocyte: 22.3 %
WBC: 8.9 Thousand/uL (ref 3.8–10.8)

## 2024-03-26 LAB — T4, FREE: Free T4: 1.2 ng/dL (ref 0.8–1.8)

## 2024-03-26 LAB — ANTI-NUCLEAR AB-TITER (ANA TITER): ANA Titer 1: 1:40 {titer} — ABNORMAL HIGH

## 2024-03-26 LAB — PSA: PSA: 3.34 ng/mL (ref <=4.00)

## 2024-03-26 LAB — COMPLETE METABOLIC PANEL WITHOUT GFR
AG Ratio: 1.8 (calc) (ref 1.0–2.5)
ALT: 13 U/L (ref 9–46)
AST: 17 U/L (ref 10–35)
Albumin: 5 g/dL (ref 3.6–5.1)
Alkaline phosphatase (APISO): 85 U/L (ref 35–144)
BUN: 15 mg/dL (ref 7–25)
CO2: 24 mmol/L (ref 20–32)
Calcium: 10.4 mg/dL — ABNORMAL HIGH (ref 8.6–10.3)
Chloride: 101 mmol/L (ref 98–110)
Creat: 1.12 mg/dL (ref 0.70–1.30)
Globulin: 2.8 g/dL (ref 1.9–3.7)
Glucose, Bld: 82 mg/dL (ref 65–99)
Potassium: 4.1 mmol/L (ref 3.5–5.3)
Sodium: 139 mmol/L (ref 135–146)
Total Bilirubin: 0.7 mg/dL (ref 0.2–1.2)
Total Protein: 7.8 g/dL (ref 6.1–8.1)

## 2024-03-26 LAB — TESTOSTERONE: Testosterone: 598 ng/dL (ref 250–827)

## 2024-03-26 LAB — TSH: TSH: 0.9 m[IU]/L (ref 0.40–4.50)

## 2024-03-26 LAB — SEDIMENTATION RATE: Sed Rate: 9 mm/h (ref 0–20)

## 2024-03-26 LAB — ANA: Anti Nuclear Antibody (ANA): POSITIVE — AB

## 2024-03-26 NOTE — Patient Instructions (Addendum)
 Night sweats may be due to Sarcoidosis. Have ordered CT with contrast. Would like for you to see Pulmonary Specialist (Dr. Milas will make referral. Only lab abnormality is slight elevated calcium .

## 2024-03-28 ENCOUNTER — Ambulatory Visit
Admission: RE | Admit: 2024-03-28 | Discharge: 2024-03-28 | Disposition: A | Discharge status: Home / Self Care | Source: Ambulatory Visit | Attending: Internal Medicine | Admitting: Internal Medicine

## 2024-03-28 ENCOUNTER — Other Ambulatory Visit: Payer: Self-pay

## 2024-03-28 DIAGNOSIS — R911 Solitary pulmonary nodule: Secondary | ICD-10-CM | POA: Diagnosis not present

## 2024-03-28 DIAGNOSIS — R768 Other specified abnormal immunological findings in serum: Secondary | ICD-10-CM

## 2024-03-28 DIAGNOSIS — R61 Generalized hyperhidrosis: Secondary | ICD-10-CM

## 2024-03-28 MED ORDER — IOPAMIDOL (ISOVUE-300) INJECTION 61%
75.0000 mL | Freq: Once | INTRAVENOUS | Status: AC | PRN
Start: 2024-03-28 — End: 2024-03-28
  Administered 2024-03-28: 75 mL via INTRAVENOUS

## 2024-04-02 ENCOUNTER — Ambulatory Visit: Payer: Self-pay

## 2024-04-02 NOTE — Progress Notes (Signed)
 Patient Care Team: [name] PARAS, MD as PCP - General (Internal Medicine) Delford Maude BROCKS, MD as PCP - Cardiology (Cardiology) Tat, Asberry RAMAN, DO as Consulting Physician (Neurology)  Visit Date: 04/03/24  Subjective:   Chief Complaint  Patient presents with   Headache  Patient PI:Charles Villarreal, Charles Villarreal DOB:02-01-1966,58 y.o. FMW:[id number]   58 y.o.Male presents today for acute visit with Night Sweats; Headache; Fatigue. Patient has a past medical history of Impaired Glucose Tolerance w/ 12/2023 HgbA1c 5.7 and Glucose WNL at 86; HLD treated with Rosuvastatin  20 mg daily  Hx of  Anxiety managed with Buspar 15 mg three times daily and Cymbalta 60 mg daily;   Hx of Mitral Regurgitation seen by Dr. Delford in 2023;  Was seen for annual visit in 05/2023 w/ Thyroid , Kidney Functions, C-Met, and CBC all normal except for Platelets 405K;    Sarcoidosis - had flare-up 2011 w/ symptoms of: cough, sweats, fatigue - and this was being followed by Pulmonology, Dr. Darlean until 2020; Not vaccinated against PNA, Hepatitis B, or Shingles, and is due to update his Tdap.   Initially seen for night sweats and fatigue on 03/24/2024 with labs ordered showing Calcium   of 10.4 .ANA was Positive,  low titer 1:40, Nuclear, Speckled pattern.   03/28/2024  Chest CT showed  NO acute chest findings, acute airspace disease, mass, lymphadenopathy, metastatic disease in the chest, or imaging evidence of sarcoidosis in the chest. He was prescribed Doxycycline  100 mg x10 days, which he has completed.   On 8/06, he called to report that his symptoms were still persisting.  He says that his headache lasted for 4 days, and he does not have one today. He is also still having severe night sweats where he's having to get up 3-4x nightly to change clothes, which he believes is contributing to his fatigue.    He mentions a recent event that occurred while exercising this past Sunday. He was finishing an 8 mile bike uphill, and when he  stopped to catch his breath he checked his pulse and it was 223. Pulse did drop to 155, and eventually normalized.This frightened him. Denied chest pain. Pulse was pounding.   Describes his activity regimen: walks dog 4 miles daily currently (doing a challenge, normally walks 2 miles) and once or twice daily rides his stationary or road bike.   He has hx  of HTN, which is treated with Amlodipine  10 mg daily - blood pressure today normal at 100/80. I am concerned that maybe BP is going too low on the amlodipine  causing some of these symptoms. I am also referring him back to his Cardiologist, Dr. Nishan for evaluation. I am going to suggest he stop Amlodipine , avoid heavy exercise and follow up with Pulmonologist and Dr. Nishan.  Past Medical History:  Diagnosis Date   Allergy    occasional   Complication of anesthesia    aggression   Depression    GERD (gastroesophageal reflux disease)    Gout    HSV-1 (herpes simplex virus 1) infection    Hyperlipidemia    Hypertension    Low back pain    Sarcoidosis    Substance abuse (HCC)    in recovery from alcohol   Tremor   No Known Allergies Immunization History  Administered Date(s) Administered   Influenza Split 06/07/2012, 06/11/2013   Influenza Whole 08/28/2009   Influenza,inj,Quad PF,6+ Mos 04/14/2019   Influenza-Unspecified 06/28/2021   Moderna Sars-Covid-2 Vaccination 09/01/2019, 10/02/2019, 08/16/2020   Pneumococcal Polysaccharide-23  04/05/2010   Tdap 06/07/2012   Past Surgical History:  Procedure Laterality Date   ANTERIOR LAT LUMBAR FUSION N/A 10/09/2017   Procedure: Thoracic twelve Corpectomy, Thoracic eleven-Lumbar one  lateral plate,   Anterior Lateral Interbody Fusion with Corpectomy Cage ;  Surgeon: Louis Shove, MD;  Location: [facility] OR;  Service: Neurosurgery;  Laterality: N/A;   APPENDECTOMY     BACK SURGERY     KNEE ARTHROPLASTY  1995   Lt   UMBILICAL HERNIA REPAIR      Family History  Problem Relation Age of Onset    Alcoholism Father    Alcoholism Sister    Hypertension Mother    Alcoholism Other    Colon cancer Neg Hx    Esophageal cancer Neg Hx    Prostate cancer Neg Hx    Rectal cancer Neg Hx    Social hx: Married with 3 adult daughters. Nonsmoker. No ETOH consumption  Review of Systems  Constitutional:  Positive for diaphoresis and malaise/fatigue. Negative for fever and weight loss.  HENT:  Negative for congestion.   Eyes:  Negative for blurred vision.  Respiratory:  Positive for shortness of breath (On Exertion). Negative for cough.   Cardiovascular:  Negative for chest pain, palpitations and leg swelling.       (+) Tachycardia  Gastrointestinal:  Negative for vomiting.  Musculoskeletal:  Negative for back pain and joint pain.  Skin:  Negative for rash.  Neurological:  Positive for headaches. Negative for loss of consciousness.     Objective:  Vitals: BP 100/80   Pulse 74   Temp 97.9 F (36.6 C)   Ht 6' (1.829 m)   SpO2 99%   BMI 24.68 kg/m   Physical Exam Vitals and nursing note reviewed.  Constitutional:      General: He is not in acute distress.    Appearance: Normal appearance. He is not ill-appearing.  HENT:     Head: Normocephalic and atraumatic.  Cardiovascular:     Rate and Villarreal: Normal rate and regular Villarreal.     Pulses: Normal pulses.     Heart sounds: Normal heart sounds. No murmur heard.    No friction rub. No gallop.  Pulmonary:     Effort: Pulmonary effort is normal. No respiratory distress.     Breath sounds: Normal breath sounds. No wheezing or rales.  Skin:    General: Skin is warm and dry.  Neurological:     Mental Status: He is alert and oriented to person, place, and time. Mental status is at baseline.  Psychiatric:        Mood and Affect: Mood normal.        Behavior: Behavior normal.        Thought Content: Thought content normal.        Judgment: Judgment normal.     Results:  Studies Obtained And Personally Reviewed By Me:  CT Chest W  Contrast 03/29/2024 FINDINGS:  Cardiovascular: Aortic atherosclerosis. Cardiomegaly. Subendocardial fibrofatty scarring of the left ventricular apex in keeping with prior infarction. Three-vessel coronary artery calcifications. Enlargement of the main pulmonary artery measuring up to 3.7 cm in caliber. No pericardial effusion.   Mediastinum/Nodes: No enlarged mediastinal, hilar, or axillary lymph nodes. Thyroid  gland, trachea, and esophagus demonstrate no significant findings.   Lungs/Pleura: Mild bibasilar scarring or atelectasis. 0.3 cm nodule of the posterior right upper lobe (series 3, image 41). No pleural effusion or pneumothorax.   Upper Abdomen: No acute abnormality.   Musculoskeletal: No chest  wall abnormality. No acute osseous findings.   IMPRESSION:  No acute CT findings of the chest. No acute airspace disease.   No mass, lymphadenopathy, or metastatic disease in the chest.   No imaging evidence of sarcoidosis in the chest.   Enlargement of the main pulmonary artery, as can be seen in pulmonary hypertension.   Cardiomegaly and coronary artery disease.   There is a 0.3 cm nodule of the posterior right upper lobe.    Echocardiogram 12/29/2021: IMPRESSIONS  1. Left ventricular ejection fraction, by estimation, is 65 to 70% . Left ventricular ejection fraction by 3D volume is 66 % . The left ventricle has normal function. The left ventricle has no regional wall motion abnormalities. Left ventricular diastolic parameters are consistent with Grade II diastolic dysfunction ( pseudonormalization) . The average left ventricular global longitudinal strain is - 24. 5 % . The global longitudinal strain is normal.   2. Right ventricular systolic function is normal. The right ventricular size is mildly enlarged. Tricuspid regurgitation signal is inadequate for assessing PA pressure.   3. Left atrial size was severely dilated.   4. The mitral valve is myxomatous. Mild to moderate mitral  valve regurgitation. No evidence of mitral stenosis. There is mild late systolic prolapse of the middle scallop of the posterior leaflet of the mitral valve.   5. The aortic valve is tricuspid. Aortic valve regurgitation is trivial. Aortic valve sclerosis is present, with no evidence of aortic valve stenosis.   6. Aortic dilatation noted. There is mild dilatation of the aortic root, measuring 41 mm. There is mild dilatation of the ascending aorta, measuring 40 mm.   7. The inferior vena cava is normal in size with greater than 50% respiratory variability, suggesting right atrial pressure of 3 mmHg.   Exercise Stress Test 12/26/2021 The patient exercised according to the BRUCE for 08: 00 min: s, achieving a work level of Max. METS: 10. 1. The resting heart rate of 69 bpm rose to a maximal heart rate of 151 bpm. This value represents 92 % of the maximal, age- predicted heart rate. The resting blood pressure of 123/ 72 mmHg , rose to a maximum blood pressure of 181/ 73 mmHg. The exercise test was stopped due to Fatigue.  Interpretation Summary: BP Response to Exercise: ADEQUATE. Chest Pain: none.  Labs:  CBC w/ Differential Lab Results  Component Value Date   WBC 8.9 03/24/2024   RBC 4.81 03/24/2024   HGB 14.5 03/24/2024   HCT 44.7 03/24/2024   PLT 294 03/24/2024   MCV 92.9 03/24/2024   MCH 30.1 03/24/2024   MCHC 32.4 03/24/2024   RDW 11.8 03/24/2024   MPV 10.2 03/24/2024   LYMPHSABS 1,290 05/31/2023   MONOABS 0.8 12/05/2018   BASOSABS 80 03/24/2024    Comprehensive Metabolic Panel Lab Results  Component Value Date   NA 139 03/24/2024   K 4.1 03/24/2024   CL 101 03/24/2024   CO2 24 03/24/2024   GLUCOSE 82 03/24/2024   BUN 15 03/24/2024   CREATININE 1.12 03/24/2024   CALCIUM  10.4 (H) 03/24/2024   PROT 7.8 03/24/2024   ALBUMIN  4.1 02/03/2018   AST 17 03/24/2024   ALT 13 03/24/2024   ALKPHOS 94 02/03/2018   BILITOT 0.7 03/24/2024   GFR 98.46 05/17/2012   EGFR 85 12/28/2023    GFRNONAA 89 02/14/2021   Lipid Panel  Lab Results  Component Value Date   CHOL 136 05/31/2023   HDL 49 05/31/2023   LDLCALC 71 05/31/2023  TRIG 78 05/31/2023   A1c Lab Results  Component Value Date   HGBA1C 5.7 (H) 12/31/2023    TSH & Free T4 Lab Results  Component Value Date   TSH 0.90 03/24/2024   FREET4 1.2 03/24/2024   PSA Lab Results  Component Value Date   PSA 3.34 03/24/2024   PSA 1.26 05/31/2023   PSA 1.20 03/16/2022    03/24/2024  ANA Titer 1     titer 1:40 (H)   ANA Pattern 1 Nuclear, Speckled !   Sed Rate     0 - 20 mm/h 9   Anti Nuclear Antibody (ANA)     NEGATIVE  POSITIVE !     Testosterone  Lab Results  Component Value Date   TESTOSTERONE  598 03/24/2024   Assessment & Plan:   Orders Placed This Encounter  Procedures   Anti-Smith antibody   Ambulatory referral to Cardiology    Referral Priority:   Routine    Referral Type:   Consultation    Referral Reason:   Specialty Services Required    Number of Visits Requested:   1  Positive ANA (antinuclear antibody): Initially seen for Night Sweats Headache, and Fatigue on 03/24/2024 with labs ordered resulting with Calcium  10.4 and ANA Positive, titer 1:40, Nuclear, Speckled pattern.  This is a low titer.  A 03/29/2024 CXR showed no acute chest findings, acute airspace disease, mass, lymphadenopathy, metastatic disease in the chest, or imaging evidence of sarcoidosis in the chest. He was prescribed Doxycycline  100 mg x10 days, which he has completed. Still having fatigue.  Chest CT March 28, 2024 showed no mediastinal or hilar lymphadenopathy. No evidence of metastatic disease.   On 8/06, he called to report that his symptoms were still persisting: headache lasting for 4 days, still having severe night sweats where he's having to get up 3-4x nightly to change clothes, which he believes is contributing to his fatigue.   He is scheduled to see Pulmonologist on August 25th. He  has hx  of Sarcoidosis with   symptoms of cough, sweats, fatigue, which was being followed by Dr. Darlean, Pulmonologist until 2020.   However, with his Positive ANA will order a Anti-Smith Antibody to r/o Lupus.   SOB (Shortness Of Breath) On Exertion; Tachycardia: Mentioned a recent event that occurred while exercising, where he was finishing an 8 mile bike uphill, and when he stopped to catch his breath he checked his pulse and it was 223. Pulse did drop to 155, and eventually normalized. He is fairly active throughout the week, walking his dog 4 miles daily currently (doing a challenge, normally walks 2 miles) and once or twice daily rides his stationary or road bike.    He has a Hx of Mitral Regurgitation seen by Dr. Delford in 2023. Echo 5/23 determined that the mitral valve is myxomatous w/ mild to moderate regurgitation, but no evidence of mitral stenosis; mild late systolic prolapse of the middle scallop of the posterior leaflet of the mitral valve. Stress test 11/2021 was normal. Due to his recent SOBOE and tachycardia, will refer him back to to Dr. Delford.   HTN treated with Amlodipine  10 mg daily - blood pressure today low systolic reading at 100/80.   With his recent symptoms, am considering that Amlodipine  may be causing hypotension and thus some of these symptoms he is having.SABRA  He did not have any evidence of Sarcoidosis on recent chest CT. Suggest holding amlodipine  and do not engage in heavy exercise until he can  see Dr. Delford. Urgent Cardiology referral placed.   With his low BP reading on amlodipine  and  recent symptoms while biking,I have suggested stopping amlodipine  and see if sxs improve. Avoid strenuous exercise for the present time. Referring back to Dr. Delford, Cardiologist to evaluate Cardiac status.  Impaired Glucose Tolerance w/ 12/2023 HgbA1c 5.7% and Glucose WNL at 86. Glucose intolerance controlled with diet and exercise.  Hyperlipidemia  treated with Rosuvastatin  20 mg daily.  Anxiety managed with  Buspar 15 mg three times daily and Cymbalta 60 mg daily.  Vaccine Counseling: not vaccinated against PNA, Hepatitis B, or Shingles, and is due to update his Tdap.   Discussed, and with his current symptoms I do not recommend he receives any vaccinations until symptoms resolve.   I,Emily Lagle,acting as a Neurosurgeon for Ronal JINNY Hailstone, MD.,have documented all relevant documentation on the behalf of Ronal JINNY Hailstone, MD,as directed by  Ronal JINNY Hailstone, MD while in the presence of Ronal JINNY Hailstone, MD.  I, Ronal JINNY Hailstone, MD, have reviewed all documentation for this visit. The documentation on 04/03/2024 for the exam, diagnosis, procedures, and orders are all accurate and complete.

## 2024-04-02 NOTE — Telephone Encounter (Signed)
   FYI Only or Action Required?: Action required by provider: clinical question for provider and update on patient condition.  Patient was last seen in primary care on 03/24/2024 by Charles Ronal PARAS, MD.  Called Nurse Triage reporting Night Sweats, Headache, and Fatigue.  Symptoms began several days ago.  Interventions attempted: OTC medications: tylenol /ibuprifen.  Symptoms are: unchanged.  Triage Disposition: See PCP When Office is Open (Within 3 Days)  Patient/caregiver understands and will follow disposition?: Yes Copied from CRM #[id number]. Topic: Clinical - Red Word Triage >> Apr 02, 2024 10:07 AM Charles Villarreal wrote: Red Word that prompted transfer to Nurse Triage:   At first he was calling to see if any tests need to be ran for him.. Patient said he's having night sweats 3-5 times a night so bad he has to wake up and change his clothes. Said he's having a headache that wont go away and body seems real worn out like he has fatigue and he's tired. Not getting any better he said . Reason for Disposition  [1] MILD-MODERATE headache AND [2] present > 3 days (72 hours) AND [3] no improvement after using Care Advice  Answer Assessment - Initial Assessment Questions Patient continues to experience night sweats and fatigue.  Would like to know if he should come in, or if there is another test that needs to be done with this new symptom [phone]   1. LOCATION: Where does it hurt?      States that it is more intense in the back, but the location is where the band of a hat would sit on his heak 2. ONSET: When did the headache start? (e.g., minutes, hours, days)      Five days ago 3. PATTERN: Does the pain come and go, or has it been constant since it started?     Constant, but severity comes and goes 4. SEVERITY: How bad is the pain? and What does it keep you from doing?  (e.g., Scale 1-10; mild, moderate, or severe)    At worse, 5/10 Constant dull headache, minimal relief from  medication 5. RECURRENT SYMPTOM: Have you ever had headaches before? If Yes, ask: When was the last time? and What happened that time?      denies 6. CAUSE: What do you think is causing the headache?     unknown 7. MIGRAINE: Have you been diagnosed with migraine headaches? If Yes, ask: Is this headache similar?      denies 8. HEAD INJURY: Has there been any recent injury to your head?      denies 9. OTHER SYMPTOMS: Do you have any other symptoms? (e.g., fever, stiff neck, eye pain, sore throat, cold symptoms)     denies 10. PREGNANCY: Is there any chance you are pregnant? When was your last menstrual period?       N/A  Protocols used: Headache-A-AH

## 2024-04-03 ENCOUNTER — Ambulatory Visit: Admitting: Internal Medicine

## 2024-04-03 ENCOUNTER — Encounter: Payer: Self-pay | Admitting: Internal Medicine

## 2024-04-03 VITALS — BP 100/80 | HR 74 | Temp 97.9°F | Ht 72.0 in

## 2024-04-03 DIAGNOSIS — R000 Tachycardia, unspecified: Secondary | ICD-10-CM | POA: Diagnosis not present

## 2024-04-03 DIAGNOSIS — R0602 Shortness of breath: Secondary | ICD-10-CM | POA: Diagnosis not present

## 2024-04-03 DIAGNOSIS — R768 Other specified abnormal immunological findings in serum: Secondary | ICD-10-CM

## 2024-04-03 NOTE — Patient Instructions (Addendum)
 Pulmonologist  will see August 25. New issue arose  recently with heavy exercise (bike trip)  up hill in the heat included HR in the 200s with strenuous exercise and extreme fatigue. Patient is to avoid heavy exercise and stay well hydrated. Discontinue amlodipine  and monitor BP.  Referral back to Dr. Delford whom he has not seen since 2023. Checking anti-Smith antibody which is more specific for lupus.

## 2024-04-04 LAB — ANTI-SMITH ANTIBODY: ENA SM Ab Ser-aCnc: <1 AI

## 2024-04-05 ENCOUNTER — Ambulatory Visit: Payer: Self-pay | Admitting: Internal Medicine

## 2024-04-15 DIAGNOSIS — F1020 Alcohol dependence, uncomplicated: Secondary | ICD-10-CM | POA: Diagnosis not present

## 2024-04-20 NOTE — Progress Notes (Signed)
 CARDIOLOGY CONSULT NOTE       Patient ID: Charles Villarreal MRN: [id number] DOB/AGE: May 28, 1966 58 y.o.  Admit date: (Not on file) Referring Physician: [name] Primary Physician: [name] PARAS, MD Primary Cardiologist: Delford Reason for Consultation: CAD    HPI:  58 y.o. referred by Dr Perri for CAD. 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 Has a history of ETOH use. Followed by [facility] for sarcoid Has familial tremor on beta blocker   Most recent LDL 05/31/23 in Epic 71  Calcium  score 10/19/21 was 544 96 th percentile including LM/3VD  No chest pain active. 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 Wife [name] also active   Discussed high calcium  score and new target for LDL being 70 or less   Ex myovue done 12/29/21 normal EF normal some PvC;s  Echo 12/29/21 EF 65-70% severe LAE myxomatous MV with mild/mod MR Aortic root 41 mm   Doing extremely well Lost about 35 lbs and stopped drinking 5 years ago. Benton is in [employer] school. 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 [place] still  ROS All other systems reviewed and negative except as noted above  Past Medical History:  Diagnosis Date   Allergy    occasional   Complication of anesthesia    aggression   Depression    GERD (gastroesophageal reflux disease)    Gout    HSV-1 (herpes simplex virus 1) infection    Hyperlipidemia    Hypertension    Low back pain    Sarcoidosis    Substance abuse (HCC)    in recovery from alcohol   Tremor     Family History  Problem Relation Age of Onset   Alcoholism Father    Alcoholism Sister    Hypertension Mother    Alcoholism Other    Colon cancer Neg Hx    Esophageal cancer Neg Hx    Prostate cancer Neg Hx    Rectal cancer Neg Hx     Social History   Socioeconomic History   Marital status:  Married    Spouse name: Not on file   Number of children: Not on file   Years of education: Not on file   Highest education level: Not on file  Occupational History    Employer: [employer]   Tobacco Use   Smoking status: Never   Smokeless tobacco: Former    Types: Chew    Quit date: 08/28/1996  Vaping Use   Vaping status: Never Used  Substance and Sexual Activity   Alcohol use: Not Currently    Comment: quit drinking end of march 2020   Drug use: No   Sexual activity: Not on file  Other Topics Concern   Not on file  Social History Narrative   Not on file   Social Drivers of Health   Financial Resource Strain: Low Risk  (12/28/2023)   Overall Financial Resource Strain (CARDIA)    Difficulty of Paying Living Expenses: Not hard at all  Food Insecurity: Not on file  Transportation Needs: Not on file  Physical Activity: Insufficiently Active (12/28/2023)   Exercise Vital Sign    Days of Exercise per Week: 2 days    Minutes of Exercise per Session: 40 min  Stress: No Stress Concern Present (12/28/2023)   [employer] of  Occupational Health - Occupational Stress Questionnaire    Feeling of Stress : Not at all  Social Connections: Not on file  Intimate Partner Violence: Not on file    Past Surgical History:  Procedure Laterality Date   ANTERIOR LAT LUMBAR FUSION N/A 10/09/2017   Procedure: Thoracic twelve Corpectomy, Thoracic eleven-Lumbar one  lateral plate,   Anterior Lateral Interbody Fusion with Corpectomy Cage ;  Surgeon: Louis Shove, MD;  Location: [facility] OR;  Service: Neurosurgery;  Laterality: N/A;   APPENDECTOMY     BACK SURGERY     KNEE ARTHROPLASTY  1995   Lt   UMBILICAL HERNIA REPAIR        Current Outpatient Medications:    amLODipine  (NORVASC ) 10 MG tablet, TAKE ONE TABLET EACH DAY, Disp: 90 tablet, Rfl: 1   aspirin EC 81 MG tablet, Take 81 mg by mouth daily., Disp: , Rfl:    busPIRone (BUSPAR) 15 MG tablet, TAKE ONE TABLET THREE TIMES DAILY, Disp: 90 tablet,  Rfl: 1   DULoxetine (CYMBALTA) 60 MG capsule, Take 1 capsule by mouth daily., Disp: , Rfl:    ibuprofen  (ADVIL ) 400 MG tablet, TAKE ONE TABLET EVERY EIGHT HOURS AS NEEDED, Disp: 90 tablet, Rfl: 2   Melatonin 10 MG TBCR, Take as directed , Disp: 90 tablet, Rfl: 1   predniSONE  (DELTASONE ) 10 MG tablet, Take 4 tablets (40 mg total) by mouth daily with breakfast for 2 days, THEN 2 tablets (20 mg total) daily with breakfast for 7 days, THEN 1 tablet (10 mg total) daily with breakfast for 3 days., Disp: 25 tablet, Rfl: 0   QUEtiapine (SEROQUEL) 50 MG tablet, Take 25-50 mg by mouth at bedtime as needed., Disp: , Rfl:    rosuvastatin  (CRESTOR ) 20 MG tablet, TAKE ONE TABLET EACH DAY, Disp: 90 tablet, Rfl: 3   tadalafil  (CIALIS ) 5 MG tablet, TAKE ONE TABLET EACH DAY AS NEEDED FOR ERECTILE DYSFUNCTION, Disp: 30 tablet, Rfl: 5    Physical Exam: Blood pressure 98/62, pulse 70, height 6' (1.829 m), weight 186 lb (84.4 kg), SpO2 97%.    Affect appropriate Healthy:  appears stated age HEENT: normal Neck supple with no adenopathy JVP normal no bruits no thyromegaly Lungs clear with no wheezing and good diaphragmatic motion Heart:  S1/S2 loud apical MR murmur, no rub, gallop or click PMI normal Abdomen: benighn, BS positve, no tenderness, no AAA no bruit.  No HSM or HJR Distal pulses intact with no bruits No edema Neuro non-focal Skin warm and dry No muscular weakness   Labs:   Lab Results  Component Value Date   WBC 8.9 03/24/2024   HGB 14.5 03/24/2024   HCT 44.7 03/24/2024   MCV 92.9 03/24/2024   PLT 294 03/24/2024   No results for input(s): NA, K, CL, CO2, BUN, CREATININE, CALCIUM , PROT, BILITOT, ALKPHOS, ALT, AST, GLUCOSE in the last 168 hours.  Invalid input(s): LABALBU Lab Results  Component Value Date   CKTOTAL 94 09/25/2017    Lab Results  Component Value Date   CHOL 136 05/31/2023   CHOL 122 03/16/2022   CHOL 179 09/30/2021   Lab Results   Component Value Date   HDL 49 05/31/2023   HDL 44 03/16/2022   HDL 46 09/30/2021   Lab Results  Component Value Date   LDLCALC 71 05/31/2023   LDLCALC 56 03/16/2022   LDLCALC 115 (H) 09/30/2021   Lab Results  Component Value Date   TRIG 78 05/31/2023   TRIG 139 03/16/2022  TRIG 84 09/30/2021   Lab Results  Component Value Date   CHOLHDL 2.8 05/31/2023   CHOLHDL 2.8 03/16/2022   CHOLHDL 3.9 09/30/2021   Lab Results  Component Value Date   LDLDIRECT 172.2 05/17/2012      Radiology: No results found.   EKG: SR rate 63 normal other than PAC 05/01/2024    ASSESSMENT AND PLAN:   CAD: subclinical with high calcium  score for age Crestor  dose increased to 20 mg on 10/27/21 LDL at goal Ex myovue normal  12/26/21  HTN:  Continue Norvasc  Sarcoid:  pulmonary CT 03/28/24 no lymphadenopathy he had a positive transbronchial biopsy in 2004 Never required steroid Rx ANA and positive but anti smith Ab negative  Tremor:  familial continue  f/u Dr Tat Psych/Depression :  previous ETOH use on Seroqauel and buspar as well as Cymbalta   Murmur: MVP with mild/mod MR on TTE 2023 will update Has severe LAE and recent CT with ? PA enlargement so concern for elevated PA pressures from regurgitant MR Aortic Root enlargement  on TTE 2023 recent CT I measure 3.8 cm in axial plane 03/28/24   TTE    F/U cardiology in a year   Signed: Maude Emmer 05/01/2024, 8:58 AM

## 2024-04-21 ENCOUNTER — Ambulatory Visit (INDEPENDENT_AMBULATORY_CARE_PROVIDER_SITE_OTHER)

## 2024-04-21 VITALS — BP 109/67 | HR 65 | Ht 72.0 in | Wt 192.2 lb

## 2024-04-21 DIAGNOSIS — D869 Sarcoidosis, unspecified: Secondary | ICD-10-CM | POA: Diagnosis not present

## 2024-04-21 MED ORDER — PREDNISONE 10 MG PO TABS: ORAL_TABLET | ORAL | 0 refills | Status: AC

## 2024-04-21 NOTE — Assessment & Plan Note (Signed)
 Patient with history of transbronchial biopsies positive for pulmonary sarcoidosis.  Enlarged lymph nodes at that time.  On review of his chart, he has had multiple flareups of sarcoid that have responded to a short course of prednisone .  Discussed with patient that it is possible that he has a pulmonary sarcoid that we are not able to identify on the CAT scan and a trial of prednisone  for a very short course might be reasonable.  He is in agreement with a trial of prednisone  which I have prescribed.   In regards to further evaluation of his sarcoid.  He has a follow-up with ophthalmology.  He is also seeing cardiology for an elevated heart rate in the 200s that was noted on his smart watch.  His liver enzymes were checked in July and appear normal.    I will put in an order to check his vitamin D level as part of the assessment

## 2024-04-21 NOTE — Progress Notes (Signed)
 Subjective:   PATIENT ID: Charles Villarreal GENDER: male DOB: Apr 15, 1966, MRN: [id number]   HPI 58 year old male with a past medical history of pulmonary sarcoidosis diagnosed with transbronchial biopsies in 2004.  Previously seen by Dr. Darlean.   Patient did have hilar and mediastinal adenopathy on review of prior CAT scans.  More recently he has been complaining of chills and night sweats which she suspects are related to his sarcoidosis.  He denies any pulmonary symptoms other than mild dyspnea on exertion.  No coughing.  No sputum production.  No loss of appetite.  No weight loss.  He underwent a CT of the chest on August 2 which does not show any evidence of intraparenchymal pulmonary disease.  Lymph nodes appear normal in size.   He states that he does not smoke cigarettes.  He denies any other infectious symptoms.  He states that if it is not sarcoid, he is going to an infectious disease doctor because he had a tick on his body in June for about 20 hours.  He states that it was not engorged and was removed.  He was not prescribed any prophylactic antibiotics back then.  In regards to sarcoidosis, he states that he has a follow-up appointment with ophthalmology to evaluate for ophthalmic involvement.  He had liver enzymes done in July and appear normal as part of the evaluation of sarcoid involvement in the liver.  He is also being seen by cardiology and an upcoming follow-up.  On his labs he is noted to have mild hypercalcemia with a calcium  of 10.4 which could be related to sarcoidosis.    Past Medical History:  Diagnosis Date   Allergy    occasional   Complication of anesthesia    aggression   Depression    GERD (gastroesophageal reflux disease)    Gout    HSV-1 (herpes simplex virus 1) infection    Hyperlipidemia    Hypertension    Low back pain    Sarcoidosis    Substance abuse (HCC)    in recovery from alcohol   Tremor      Family History  Problem Relation Age of  Onset   Alcoholism Father    Alcoholism Sister    Hypertension Mother    Alcoholism Other    Colon cancer Neg Hx    Esophageal cancer Neg Hx    Prostate cancer Neg Hx    Rectal cancer Neg Hx      Social History   Socioeconomic History   Marital status: Married    Spouse name: Not on file   Number of children: Not on file   Years of education: Not on file   Highest education level: Not on file  Occupational History    Employer: [employer]   Tobacco Use   Smoking status: Never   Smokeless tobacco: Former    Types: Chew    Quit date: 08/28/1996  Vaping Use   Vaping status: Never Used  Substance and Sexual Activity   Alcohol use: Not Currently    Comment: quit drinking end of march 2020   Drug use: No   Sexual activity: Not on file  Other Topics Concern   Not on file  Social History Narrative   Not on file   Social Drivers of Health   Financial Resource Strain: Low Risk  (12/28/2023)   Overall Financial Resource Strain (CARDIA)    Difficulty of Paying Living Expenses: Not hard at all  Food Insecurity:  Not on file  Transportation Needs: Not on file  Physical Activity: Insufficiently Active (12/28/2023)   Exercise Vital Sign    Days of Exercise per Week: 2 days    Minutes of Exercise per Session: 40 min  Stress: No Stress Concern Present (12/28/2023)   [employer] of Occupational Health - Occupational Stress Questionnaire    Feeling of Stress : Not at all  Social Connections: Not on file  Intimate Partner Violence: Not on file     No Known Allergies   Outpatient Medications Prior to Visit  Medication Sig Dispense Refill   aspirin EC 81 MG tablet Take 81 mg by mouth daily.     busPIRone (BUSPAR) 15 MG tablet TAKE ONE TABLET THREE TIMES DAILY 90 tablet 1   DULoxetine (CYMBALTA) 60 MG capsule Take 1 capsule by mouth daily.     ibuprofen  (ADVIL ) 400 MG tablet TAKE ONE TABLET EVERY EIGHT HOURS AS NEEDED 90 tablet 2   Melatonin 10 MG TBCR Take as directed  90  tablet 1   QUEtiapine (SEROQUEL) 50 MG tablet Take 25-50 mg by mouth at bedtime as needed.     rosuvastatin  (CRESTOR ) 20 MG tablet TAKE ONE TABLET EACH DAY 90 tablet 3   tadalafil  (CIALIS ) 5 MG tablet TAKE ONE TABLET EACH DAY AS NEEDED FOR ERECTILE DYSFUNCTION 30 tablet 5   amLODipine  (NORVASC ) 10 MG tablet TAKE ONE TABLET EACH DAY (Patient not taking: Reported on 04/21/2024) 90 tablet 1   No facility-administered medications prior to visit.    ROS Reviewed all systems and reported negative except as above     Objective:   Vitals:   04/21/24 1133  BP: 109/67  Pulse: 65  SpO2: 96%  Weight: 192 lb 3.2 oz (87.2 kg)  Height: 6' (1.829 m)    Physical Exam Constitutional:      General: He is not in acute distress.    Appearance: Normal appearance. He is not ill-appearing.  HENT:     Nose: Nose normal.     Mouth/Throat:     Mouth: Mucous membranes are moist.  Eyes:     Extraocular Movements: Extraocular movements intact.     Pupils: Pupils are equal, round, and reactive to light.  Cardiovascular:     Rate and Rhythm: Normal rate and regular rhythm.  Pulmonary:     Effort: Pulmonary effort is normal. No respiratory distress.     Breath sounds: No stridor. No wheezing, rhonchi or rales.  Abdominal:     General: Abdomen is flat.     Palpations: Abdomen is soft.  Musculoskeletal:        General: Normal range of motion.     Cervical back: Normal range of motion.  Skin:    General: Skin is warm.     Capillary Refill: Capillary refill takes less than 2 seconds.  Neurological:     General: No focal deficit present.     Mental Status: He is oriented to person, place, and time.     Cranial Nerves: No cranial nerve deficit.     Sensory: No sensory deficit.     Motor: No weakness.     Coordination: Coordination normal.        CBC    Component Value Date/Time   WBC 8.9 03/24/2024 1508   RBC 4.81 03/24/2024 1508   HGB 14.5 03/24/2024 1508   HCT 44.7 03/24/2024 1508    PLT 294 03/24/2024 1508   MCV 92.9 03/24/2024 1508   MCH 30.1 03/24/2024  1508   MCHC 32.4 03/24/2024 1508   RDW 11.8 03/24/2024 1508   LYMPHSABS 1,290 05/31/2023 1022   MONOABS 0.8 12/05/2018 0855   EOSABS 178 03/24/2024 1508   BASOSABS 80 03/24/2024 1508     Chest imaging: His chest CT performed on August 2025.  No evidence of lymphadenopathy.  No intraparenchymal lung disease related to sarcoidosis present  PFT: No PFTs on file   Labs: Laboratory findings as mentioned above.  Pertinent for an elevated calcium  level.   Echo:  Echocardiogram in 2023 with grade 2 diastolic dysfunction.  Normal ejection fraction.  Normal RV function.     Assessment & Plan:   Assessment & Plan PULMONARY SARCOIDOSIS Patient with history of transbronchial biopsies positive for pulmonary sarcoidosis.  Enlarged lymph nodes at that time.  On review of his chart, he has had multiple flareups of sarcoid that have responded to a short course of prednisone .  Discussed with patient that it is possible that he has a pulmonary sarcoid that we are not able to identify on the CAT scan and a trial of prednisone  for a very short course might be reasonable.  He is in agreement with a trial of prednisone  which I have prescribed.   In regards to further evaluation of his sarcoid.  He has a follow-up with ophthalmology.  He is also seeing cardiology for an elevated heart rate in the 200s that was noted on his smart watch.  His liver enzymes were checked in July and appear normal.    I will put in an order to check his vitamin D level as part of the assessment  Taper prescribed.  Plan for 40 days for 2 days and then 20 mg for 7 days.  Followed by 10 mg for 3 more days.  Patient advised to reach out if his symptoms recur while he is tapering.SABRA  He was advised that this is a trial as he has no evidence of pulmonary sarcoid at this time.  Should his symptoms not resolve with prednisone  then he should stop prednisone  and  reach out to the clinic.            Zola Herter, MD [facility] Office: [phone]

## 2024-04-21 NOTE — Patient Instructions (Addendum)
 Given your history of sarcoidosis. We will trial some prednisone  to see if if your chills and night sweats are a part of a sarcoid flare up.   Take prednisone  40mg  daily for 3 days, 20mg  for 7 days, 10 mg for 3 days and then stop. Please report if your symptoms come back while tapering down prednisone    We will see you in the clinic in 6 months. Reach out if you need anything sooner

## 2024-04-29 ENCOUNTER — Encounter (HOSPITAL_BASED_OUTPATIENT_CLINIC_OR_DEPARTMENT_OTHER): Payer: Self-pay

## 2024-05-01 ENCOUNTER — Ambulatory Visit: Attending: Cardiovascular Disease | Admitting: Cardiovascular Disease

## 2024-05-01 VITALS — BP 98/62 | HR 70 | Ht 72.0 in | Wt 186.0 lb

## 2024-05-01 DIAGNOSIS — R011 Cardiac murmur, unspecified: Secondary | ICD-10-CM | POA: Diagnosis not present

## 2024-05-01 DIAGNOSIS — I2510 Atherosclerotic heart disease of native coronary artery without angina pectoris: Secondary | ICD-10-CM | POA: Diagnosis not present

## 2024-05-01 NOTE — Patient Instructions (Signed)
 Medication Instructions:  Your physician recommends that you continue on your current medications as directed. Please refer to the Current Medication list given to you today.  *If you need a refill on your cardiac medications before your next appointment, please call your pharmacy*  Lab Work: If you have labs (blood work) drawn today and your tests are completely normal, you will receive your results only by: MyChart Message (if you have MyChart) OR A paper copy in the mail If you have any lab test that is abnormal or we need to change your treatment, we will call you to review the results.  Testing/Procedures: Your physician has requested that you have an echocardiogram. Echocardiography is a painless test that uses sound waves to create images of your heart. It provides your doctor with information about the size and shape of your heart and how well your heart's chambers and valves are working. This procedure takes approximately one hour. There are no restrictions for this procedure. Please do NOT wear cologne, perfume, aftershave, or lotions (deodorant is allowed). Please arrive 15 minutes prior to your appointment time.  Please note: We ask at that you not bring children with you during ultrasound (echo/ vascular) testing. Due to room size and safety concerns, children are not allowed in the ultrasound rooms during exams. Our front office staff cannot provide observation of children in our lobby area while testing is being conducted. An adult accompanying a patient to their appointment will only be allowed in the ultrasound room at the discretion of the ultrasound technician under special circumstances. We apologize for any inconvenience. Follow-Up: At [facility], you and your health needs are our priority.  As part of our continuing mission to provide you with exceptional heart care, our providers are all part of one team.  This team includes your primary Cardiologist (physician)  and Advanced Practice Providers or APPs (Physician Assistants and Nurse Practitioners) who all work together to provide you with the care you need, when you need it.  Your next appointment:   1 year(s)  Provider:   Janelle Mediate, MD    We recommend signing up for the patient portal called "MyChart".  Sign up information is provided on this After Visit Summary.  MyChart is used to connect with patients for Virtual Visits (Telemedicine).  Patients are able to view lab/test results, encounter notes, upcoming appointments, etc.  Non-urgent messages can be sent to your provider as well.   To learn more about what you can do with MyChart, go to [email or web address].

## 2024-05-02 ENCOUNTER — Ambulatory Visit: Admitting: Student in an Organized Health Care Education/Training Program

## 2024-05-06 DIAGNOSIS — F329 Major depressive disorder, single episode, unspecified: Secondary | ICD-10-CM | POA: Diagnosis not present

## 2024-05-12 DIAGNOSIS — F1020 Alcohol dependence, uncomplicated: Secondary | ICD-10-CM | POA: Diagnosis not present

## 2024-05-19 ENCOUNTER — Other Ambulatory Visit: Payer: Self-pay

## 2024-05-19 DIAGNOSIS — R61 Generalized hyperhidrosis: Secondary | ICD-10-CM

## 2024-05-26 ENCOUNTER — Ambulatory Visit (HOSPITAL_COMMUNITY)
Admission: RE | Admit: 2024-05-26 | Discharge: 2024-05-26 | Disposition: A | Discharge status: Home / Self Care | Source: Ambulatory Visit | Attending: Surgery | Admitting: Surgery

## 2024-05-26 ENCOUNTER — Ambulatory Visit: Payer: Self-pay | Admitting: Cardiovascular Disease

## 2024-05-26 DIAGNOSIS — Z79899 Other long term (current) drug therapy: Secondary | ICD-10-CM

## 2024-05-26 DIAGNOSIS — I2510 Atherosclerotic heart disease of native coronary artery without angina pectoris: Secondary | ICD-10-CM | POA: Insufficient documentation

## 2024-05-26 DIAGNOSIS — R011 Cardiac murmur, unspecified: Secondary | ICD-10-CM | POA: Insufficient documentation

## 2024-05-26 DIAGNOSIS — I340 Nonrheumatic mitral (valve) insufficiency: Secondary | ICD-10-CM

## 2024-05-26 DIAGNOSIS — I341 Nonrheumatic mitral (valve) prolapse: Secondary | ICD-10-CM

## 2024-05-26 LAB — ECHOCARDIOGRAM COMPLETE
AR max vel: 3.82 cm2
AV Area VTI: 3.79 cm2
AV Area mean vel: 3.55 cm2
AV Mean grad: 2 mmHg
AV Peak grad: 4.6 mmHg
Ao pk vel: 1.07 m/s
Area-P 1/2: 4.06 cm2
MV M vel: 3.91 m/s
MV Peak grad: 61.2 mmHg
MV VTI: 0.69 cm2
P 1/2 time: 841 ms
Radius: 0.68 cm
S' Lateral: 3.92 cm

## 2024-05-28 MED ORDER — HYDROCHLOROTHIAZIDE 12.5 MG PO CAPS: 12.5000 mg | ORAL_CAPSULE | Freq: Every day | ORAL | 3 refills | Status: AC

## 2024-05-28 NOTE — Addendum Note (Signed)
 Addended by: LORING ANDRIETTE HERO on: 05/28/2024 05:06 PM   Modules accepted: Orders

## 2024-06-02 ENCOUNTER — Encounter: Payer: Self-pay | Admitting: Internal Medicine

## 2024-06-02 ENCOUNTER — Ambulatory Visit: Admitting: Internal Medicine

## 2024-06-02 VITALS — BP 130/80 | HR 62 | Temp 97.9°F | Ht 72.0 in | Wt 192.0 lb

## 2024-06-02 DIAGNOSIS — R61 Generalized hyperhidrosis: Secondary | ICD-10-CM

## 2024-06-02 DIAGNOSIS — I10 Essential (primary) hypertension: Secondary | ICD-10-CM

## 2024-06-02 DIAGNOSIS — E782 Mixed hyperlipidemia: Secondary | ICD-10-CM

## 2024-06-02 DIAGNOSIS — G250 Essential tremor: Secondary | ICD-10-CM

## 2024-06-02 DIAGNOSIS — F1021 Alcohol dependence, in remission: Secondary | ICD-10-CM

## 2024-06-02 DIAGNOSIS — Z862 Personal history of diseases of the blood and blood-forming organs and certain disorders involving the immune mechanism: Secondary | ICD-10-CM | POA: Diagnosis not present

## 2024-06-02 DIAGNOSIS — I340 Nonrheumatic mitral (valve) insufficiency: Secondary | ICD-10-CM

## 2024-06-02 MED ORDER — GLYCOPYRROLATE 1 MG PO TABS: 1.0000 mg | ORAL_TABLET | Freq: Two times a day (BID) | ORAL | 1 refills | Status: DC

## 2024-06-02 NOTE — Progress Notes (Addendum)
 Patient Care Team: [name] PARAS, MD as PCP - General (Internal Medicine) Delford Maude BROCKS, MD as PCP - Cardiology (Cardiology) Tat, Asberry RAMAN, DO as Consulting Physician (Neurology)  Visit Date: 06/02/24  Subjective:    Patient ID: Charles Villarreal , Male   DOB: Mar 19, 1966, 58 y.o.    MRN: [id number]   58 y.o. Male presents today for Night sweats. Patient has a past medical history of impaired glucose tolerance, Hyperlipidemia, Anxiety. He was dx with Sarcoidosis in 2004 with transbronchial biopsies. Has been basically asymptomatic. Was able to exercise and work full time. He was seen  May 2nd with  complaint of chills, sore throat, ear pain, diaphoresis,diarrhea and nausea. Congestion was heard in left lower lung field but CXR did not show pneumonia. Was treated with Doxycycline   for 10 days and given one dose IM Rocephin Complained of fatigue at that time. TSH was normal. Was not seen again until July 28.Was complaining of night sweats and was referred to Pulmonary with hx of sarcoidosis.Only out of state travel was to a [place] for his [place] early summer. Has not had pneumococcal vaccine, Hepatitis B vaccine, Shingrix vaccines.  Patent has hx of HTN treated with amlodipine   and hyperlipidemia treated with Rosuvastatin . Remote hx of alcohol abuse and has done well in recovery for a number of years.   Continues to experience significant night sweats. He was referred to Endocrinology but the referral was declined by that office. He has a history of Sarcoidosis with his last flare being in 2011. Has been seen by Pulmonary, Dr. Zaida in August. Chest CT revealed no acte findings. No mass, no lymphadenopathy or metastatic disease.Main pulmonary artery was enlarged suggestive of pulmonary HTN.CAD was present and pt had 0.3cm nodule  in posterior right upper lobe.Pt felt to be at low risk for cancer. No sarcoidosis was identified.    On 03/24/2024  ANA was Positive but Anti Smith  antibody was negative so I did not think he has SLE.. Sed rate was 9.   History of impaired glucose intolerance and 12/31/2023 HgbA1c was 5.7%.   On 03/24/2024 Testosterone , TSH , CBC, BUN and Creatinine were all normal. Calcium  was 10.4.. He says that he hasn't traveled anywhere recently.  No recent out of country travel. He is very frustrated with persistent night sweats..   He saw Dr Delford in early September who felt he was doing well from a cardiac standpoint. Hx of lumbar surgery by Dr. Louis after suffering wedge compression T12 burst fracture  in 2018.  Hx of essential tremor seen by Dr. Evonnie, Neurologist. Remote Hx of gout.  Past Medical History:  Diagnosis Date   Allergy    occasional   Complication of anesthesia    aggression   Depression    GERD (gastroesophageal reflux disease)    Gout    HSV-1 (herpes simplex virus 1) infection    Hyperlipidemia    Hypertension    Low back pain    Sarcoidosis    Substance abuse (HCC)    in recovery from alcohol   Tremor      Family History  Problem Relation Age of Onset   Alcoholism Father    Alcoholism Sister    Hypertension Mother    Alcoholism Other    Colon cancer Neg Hx    Esophageal cancer Neg Hx    Prostate cancer Neg Hx    Rectal cancer Neg Hx  Review of Systems  All other systems reviewed and are negative.       Objective:   Vitals: BP 130/80   Pulse 62   Temp 97.9 F (36.6 C)   Ht 6' (1.829 m)   Wt 192 lb (87.1 kg)   SpO2 97%   BMI 26.04 kg/m    Physical Exam Vitals and nursing note reviewed.       Results:      Labs:       Component Value Date/Time   Villarreal 139 03/24/2024 1508   K 4.1 03/24/2024 1508   CL 101 03/24/2024 1508   CO2 24 03/24/2024 1508   GLUCOSE 82 03/24/2024 1508   BUN 15 03/24/2024 1508   CREATININE 1.12 03/24/2024 1508   CALCIUM  10.4 (H) 03/24/2024 1508   PROT 7.8 03/24/2024 1508   ALBUMIN  4.1 02/03/2018 0046   AST 17 03/24/2024 1508   ALT 13  03/24/2024 1508   ALKPHOS 94 02/03/2018 0046   BILITOT 0.7 03/24/2024 1508   GFRNONAA 89 02/14/2021 1142   GFRAA 103 02/14/2021 1142     Lab Results  Component Value Date   WBC 8.9 03/24/2024   HGB 14.5 03/24/2024   HCT 44.7 03/24/2024   MCV 92.9 03/24/2024   PLT 294 03/24/2024    Lab Results  Component Value Date   CHOL 136 05/31/2023   HDL 49 05/31/2023   LDLCALC 71 05/31/2023   LDLDIRECT 172.2 05/17/2012   TRIG 78 05/31/2023   CHOLHDL 2.8 05/31/2023    Lab Results  Component Value Date   HGBA1C 5.7 (H) 12/31/2023     Lab Results  Component Value Date   TSH 0.90 03/24/2024     Lab Results  Component Value Date   PSA 3.34 03/24/2024   PSA 1.26 05/31/2023   PSA 1.20 03/16/2022         Assessment & Plan:   Night Sweats: He presents here today for night sweats. He has been experiencing this now since July and complains of sweating through  his sheets. He was referred to Endocrinology but they declined to see him. He has a history of sarcoidoses with his last flare up being in 2011 but his night sweats are not believed to related. Has seen Pulmonary. On 03/24/2024 , ANA was Positive but Anti Claudene was negative so he is not believed to have lupus. Sed rate was 9. History of impaired glucose intolerance; 12/31/2023 HgbA1c was 5.7%. 03/24/2024 Testosterone , TSH , CBC, BUN and creatinine were all normal besides his Calcium  10.4. He says that he hasn't traveled anywhere recently. He is very frustrated with the situation.   C-reactive protein, calcium , blood culture, HIV, Quantiferon and TSH tests ordered.   Abdominal CT ordered.    Referred to Infectious Disease consultant.     Robinul   1 mg twice daily prescribed.   Hx of essential tremor seen by Dr. Evonnie  Sarcoidosis thought to be stable by Pulmonary  Hx of gout  Hx of neurosurgery for vertebral fx  Hx of alcoholism in recovery and has done well with this  Hyperlipidemia treated with Rosuvastatin   GERD  treated with PPI  HTN treated with amlodipine  and hydrochlorothiazide  Hx of anxiety treated with Buspar. Also takes Cymbalta  Hx of mitral regurgitation- recent ECHO by Dr. Delford. ECHO showed normal LV and LAE. Myxomatous mitral valve with prolapse.  Plan: I have researched night sweats in Up to Date. I have discussed this with him today. Have ordered abdominal  CT looking for occult infection. Check Quantiferon, HIV and c-reactive protein. Blood culture drawn. TSH and calcium  checked. Referral to Infectious Disease for evaluation for  ? occult infection. Try Robinul  1 mg twice daily.    I,Makayla C Reid,acting as a scribe for Ronal JINNY Hailstone, MD.,have documented all relevant documentation on the behalf of Ronal JINNY Hailstone, MD,as directed by  Ronal JINNY Hailstone, MD while in the presence of Ronal JINNY Hailstone, MD.   I, Ronal JINNY Hailstone, MD, have reviewed all documentation for this visit. The documentation on 06/02/2024 for the exam, diagnosis, procedures, and orders are all accurate and complete.

## 2024-06-02 NOTE — Patient Instructions (Addendum)
 Try Robinul  twice daily. CT of abdomen to rule out occult abscess or tumor. Blood culture done x 1. Recheck calcium . Check TSH. Check C-reactive protein and Quantiferon. Referral to Infectious Disease consultant.

## 2024-06-03 ENCOUNTER — Ambulatory Visit: Payer: Self-pay | Admitting: Internal Medicine

## 2024-06-03 DIAGNOSIS — F329 Major depressive disorder, single episode, unspecified: Secondary | ICD-10-CM | POA: Diagnosis not present

## 2024-06-04 LAB — CALCIUM: Calcium: 9.5 mg/dL (ref 8.6–10.3)

## 2024-06-04 LAB — TSH: TSH: 0.85 m[IU]/L (ref 0.40–4.50)

## 2024-06-04 LAB — QUANTIFERON-TB GOLD PLUS
Mitogen-NIL: >10 [IU]/mL
NIL: 0.03 [IU]/mL
QuantiFERON-TB Gold Plus: NEGATIVE
TB1-NIL: <0 [IU]/mL
TB2-NIL: 0 [IU]/mL

## 2024-06-04 LAB — HIV ANTIBODY (ROUTINE TESTING W REFLEX)
HIV 1&2 Ab, 4th Generation: NONREACTIVE
HIV FINAL INTERPRETATION: NEGATIVE

## 2024-06-04 LAB — C-REACTIVE PROTEIN: CRP: <3 mg/L (ref <8.0)

## 2024-06-06 ENCOUNTER — Other Ambulatory Visit: Payer: Self-pay | Admitting: Internal Medicine

## 2024-06-06 DIAGNOSIS — F1021 Alcohol dependence, in remission: Secondary | ICD-10-CM

## 2024-06-06 DIAGNOSIS — Z862 Personal history of diseases of the blood and blood-forming organs and certain disorders involving the immune mechanism: Secondary | ICD-10-CM

## 2024-06-06 DIAGNOSIS — G250 Essential tremor: Secondary | ICD-10-CM

## 2024-06-06 DIAGNOSIS — R61 Generalized hyperhidrosis: Secondary | ICD-10-CM

## 2024-06-06 DIAGNOSIS — E782 Mixed hyperlipidemia: Secondary | ICD-10-CM

## 2024-06-06 DIAGNOSIS — I340 Nonrheumatic mitral (valve) insufficiency: Secondary | ICD-10-CM

## 2024-06-06 DIAGNOSIS — I10 Essential (primary) hypertension: Secondary | ICD-10-CM

## 2024-06-07 LAB — CULTURE, BLOOD (SINGLE): MICRO NUMBER:: 17061458

## 2024-06-09 ENCOUNTER — Ambulatory Visit
Admission: RE | Admit: 2024-06-09 | Discharge: 2024-06-09 | Disposition: A | Source: Ambulatory Visit | Attending: Internal Medicine | Admitting: Internal Medicine

## 2024-06-09 DIAGNOSIS — K5730 Diverticulosis of large intestine without perforation or abscess without bleeding: Secondary | ICD-10-CM | POA: Diagnosis not present

## 2024-06-09 DIAGNOSIS — R61 Generalized hyperhidrosis: Secondary | ICD-10-CM

## 2024-06-09 MED ORDER — IOPAMIDOL (ISOVUE-300) INJECTION 61%
100.0000 mL | Freq: Once | INTRAVENOUS | Status: AC | PRN
  Administered 2024-06-09: 100 mL via INTRAVENOUS

## 2024-06-09 NOTE — Progress Notes (Unsigned)
 [facility]ious Disease Consult: Night sweats  Requesting Physician: Ronal PARAS. Perri, MD   Subjective:    Patient ID: Charles Villarreal, male    DOB: 25-May-1966, 58 y.o.   MRN: [id number]  HPI  Past Medical History:  Diagnosis Date   Allergy    occasional   Complication of anesthesia    aggression   Depression    GERD (gastroesophageal reflux disease)    Gout    HSV-1 (herpes simplex virus 1) infection    Hyperlipidemia    Hypertension    Low back pain    Sarcoidosis    Substance abuse (HCC)    in recovery from alcohol   Tremor     Past Surgical History:  Procedure Laterality Date   ANTERIOR LAT LUMBAR FUSION N/A 10/09/2017   Procedure: Thoracic twelve Corpectomy, Thoracic eleven-Lumbar one  lateral plate,   Anterior Lateral Interbody Fusion with Corpectomy Cage ;  Surgeon: Louis Shove, MD;  Location: [facility] OR;  Service: Neurosurgery;  Laterality: N/A;   APPENDECTOMY     BACK SURGERY     KNEE ARTHROPLASTY  1995   Lt   UMBILICAL HERNIA REPAIR      Family History  Problem Relation Age of Onset   Alcoholism Father    Alcoholism Sister    Hypertension Mother    Alcoholism Other    Colon cancer Neg Hx    Esophageal cancer Neg Hx    Prostate cancer Neg Hx    Rectal cancer Neg Hx       Social History   Socioeconomic History   Marital status: Married    Spouse name: Not on file   Number of children: Not on file   Years of education: Not on file   Highest education level: Not on file  Occupational History    Employer: [employer]   Tobacco Use   Smoking status: Never   Smokeless tobacco: Former    Types: Chew    Quit date: 08/28/1996  Vaping Use   Vaping status: Never Used  Substance and Sexual Activity   Alcohol use: Not Currently    Comment: quit drinking end of march 2020   Drug use: No   Sexual activity: Not on file  Other Topics Concern   Not on file  Social History Narrative   Not on file   Social Drivers of Health   Financial Resource  Strain: Low Risk  (12/28/2023)   Overall Financial Resource Strain (CARDIA)    Difficulty of Paying Living Expenses: Not hard at all  Food Insecurity: Not on file  Transportation Needs: Not on file  Physical Activity: Insufficiently Active (12/28/2023)   Exercise Vital Sign    Days of Exercise per Week: 2 days    Minutes of Exercise per Session: 40 min  Stress: No Stress Concern Present (12/28/2023)   [employer] of Occupational Health - Occupational Stress Questionnaire    Feeling of Stress : Not at all  Social Connections: Not on file    No Known Allergies   Current Outpatient Medications:    amLODipine  (NORVASC ) 10 MG tablet, TAKE ONE TABLET EACH DAY, Disp: 90 tablet, Rfl: 1   aspirin EC 81 MG tablet, Take 81 mg by mouth daily., Disp: , Rfl:    busPIRone (BUSPAR) 15 MG tablet, TAKE ONE TABLET THREE TIMES DAILY, Disp: 90 tablet, Rfl: 1   DULoxetine (CYMBALTA) 60 MG capsule, Take 1 capsule by mouth daily., Disp: , Rfl:  glycopyrrolate  (ROBINUL ) 1 MG tablet, Take 1 tablet (1 mg total) by mouth 2 (two) times daily., Disp: 60 tablet, Rfl: 1   hydrochlorothiazide (MICROZIDE) 12.5 MG capsule, Take 1 capsule (12.5 mg total) by mouth daily., Disp: 90 capsule, Rfl: 3   ibuprofen  (ADVIL ) 400 MG tablet, TAKE ONE TABLET EVERY EIGHT HOURS AS NEEDED, Disp: 90 tablet, Rfl: 2   Melatonin 10 MG TBCR, Take as directed , Disp: 90 tablet, Rfl: 1   QUEtiapine (SEROQUEL) 50 MG tablet, Take 25-50 mg by mouth at bedtime as needed., Disp: , Rfl:    rosuvastatin  (CRESTOR ) 20 MG tablet, TAKE ONE TABLET EACH DAY, Disp: 90 tablet, Rfl: 3   tadalafil  (CIALIS ) 5 MG tablet, TAKE ONE TABLET EACH DAY AS NEEDED FOR ERECTILE DYSFUNCTION, Disp: 30 tablet, Rfl: 5   Review of Systems     Objective:   Physical Exam        Assessment & Plan:

## 2024-06-10 ENCOUNTER — Other Ambulatory Visit: Payer: Self-pay

## 2024-06-10 ENCOUNTER — Ambulatory Visit: Admitting: Infectious Disease

## 2024-06-10 ENCOUNTER — Encounter: Payer: Self-pay | Admitting: Infectious Disease

## 2024-06-10 VITALS — BP 101/67 | HR 71 | Temp 97.7°F | Ht 72.0 in | Wt 181.0 lb

## 2024-06-10 DIAGNOSIS — R61 Generalized hyperhidrosis: Secondary | ICD-10-CM

## 2024-06-10 DIAGNOSIS — Z113 Encounter for screening for infections with a predominantly sexual mode of transmission: Secondary | ICD-10-CM | POA: Diagnosis not present

## 2024-06-10 DIAGNOSIS — D869 Sarcoidosis, unspecified: Secondary | ICD-10-CM | POA: Diagnosis not present

## 2024-06-10 DIAGNOSIS — I340 Nonrheumatic mitral (valve) insufficiency: Secondary | ICD-10-CM | POA: Diagnosis not present

## 2024-06-13 NOTE — Progress Notes (Signed)
 Left detailed VM for  patient and instructed them to come at 0900  and to be NPO after 0000.  Medications reviewed and patient instructed.   Advised that patient have a ride home and someone to stay with them for 24 hours after the procedure.   Advised  no breaks in taking blood thinner for 3+ weeks prior to procedure.    Left number to call back if they have additional questions.

## 2024-06-13 NOTE — Progress Notes (Signed)
 Pt returned phone call. Verified with this RN preprocedure instructions

## 2024-06-15 LAB — EPSTEIN-BARR VIRUS VCA ANTIBODY PANEL
EBV NA IgG: >600 U/mL — ABNORMAL HIGH
EBV VCA IgG: 689 U/mL — ABNORMAL HIGH
EBV VCA IgM: <36 U/mL

## 2024-06-15 LAB — CBC WITH DIFFERENTIAL/PLATELET
Absolute Lymphocytes: 1472 {cells}/uL (ref 850–3900)
Absolute Monocytes: 512 {cells}/uL (ref 200–950)
Basophils Absolute: 90 {cells}/uL (ref 0–200)
Basophils Relative: 1.4 %
Eosinophils Absolute: 192 {cells}/uL (ref 15–500)
Eosinophils Relative: 3 %
HCT: 45.7 % (ref 38.5–50.0)
Hemoglobin: 14.8 g/dL (ref 13.2–17.1)
MCH: 30.3 pg (ref 27.0–33.0)
MCHC: 32.4 g/dL (ref 32.0–36.0)
MCV: 93.5 fL (ref 80.0–100.0)
MPV: 10.2 fL (ref 7.5–12.5)
Monocytes Relative: 8 %
Neutro Abs: 4134 {cells}/uL (ref 1500–7800)
Neutrophils Relative %: 64.6 %
Platelets: 375 Thousand/uL (ref 140–400)
RBC: 4.89 Million/uL (ref 4.20–5.80)
RDW: 11.9 % (ref 11.0–15.0)
Total Lymphocyte: 23 %
WBC: 6.4 Thousand/uL (ref 3.8–10.8)

## 2024-06-15 LAB — PROTEIN ELECTROPHORESIS, SERUM
Alpha 1: 4.6 g/dL (ref 3.8–4.8)
Alpha 2: 0.9 g/dL (ref 0.5–0.9)
Beta Globulin: 0.5 g/dL (ref 0.4–0.6)
Beta Globulin: 0.9 g/dL (ref 0.5–0.9)
Gamma Globulin: 1 g/dL (ref 0.8–1.7)
SPE Interp.: 1 g/dL (ref 0.8–1.7)
Total Protein: 7.5 g/dL (ref 6.1–8.1)

## 2024-06-15 LAB — FERRITIN: Ferritin: 115 ng/mL (ref 38–380)

## 2024-06-15 LAB — CK: Total CK: 153 U/L (ref 23–325)

## 2024-06-15 LAB — COMPLETE METABOLIC PANEL WITHOUT GFR
AG Ratio: 1.8 (calc) (ref 1.0–2.5)
ALT: 15 U/L (ref 9–46)
AST: 19 U/L (ref 10–35)
Albumin: 4.8 g/dL (ref 3.6–5.1)
Alkaline phosphatase (APISO): 103 U/L (ref 35–144)
BUN: 13 mg/dL (ref 7–25)
CO2: 23 mmol/L (ref 20–32)
Calcium: 10 mg/dL (ref 8.6–10.3)
Chloride: 103 mmol/L (ref 98–110)
Creat: 1.1 mg/dL (ref 0.70–1.30)
Globulin: 2.6 g/dL (ref 1.9–3.7)
Glucose, Bld: 100 mg/dL — ABNORMAL HIGH (ref 65–99)
Potassium: 4.2 mmol/L (ref 3.5–5.3)
Sodium: 138 mmol/L (ref 135–146)
Total Bilirubin: 0.6 mg/dL (ref 0.2–1.2)
Total Protein: 7.4 g/dL (ref 6.1–8.1)

## 2024-06-15 LAB — CMV IGM: CMV IgM: <30 [AU]/ml

## 2024-06-15 LAB — HEPATITIS C AB W/RFL RNA, PCR + GENO: Hepatitis C Ab: NONREACTIVE

## 2024-06-15 LAB — HEPATITIS A ANTIBODY, TOTAL: Hepatitis A AB,Total: NONREACTIVE

## 2024-06-15 LAB — RHEUMATOID FACTOR: Rheumatoid fact SerPl-aCnc: <10 [IU]/mL (ref <14)

## 2024-06-15 LAB — CULTURE, BLOOD (SINGLE)
MICRO NUMBER:: 17097136
MICRO NUMBER:: 17097123
Result:: NO GROWTH
Result:: NO GROWTH
SPECIMEN QUALITY:: ADEQUATE
SPECIMEN QUALITY:: ADEQUATE

## 2024-06-15 LAB — SEDIMENTATION RATE: Sed Rate: 11 mm/h (ref 0–20)

## 2024-06-15 LAB — C-REACTIVE PROTEIN: CRP: <3 mg/L (ref <8.0)

## 2024-06-15 LAB — RPR: RPR Ser Ql: NONREACTIVE

## 2024-06-15 LAB — LACTATE DEHYDROGENASE: LDH: 151 U/L (ref 120–250)

## 2024-06-15 LAB — ANGIOTENSIN CONVERTING ENZYME: Angiotensin-Converting Enzyme: 22 U/L (ref 9–67)

## 2024-06-15 LAB — CYTOMEGALOVIRUS ANTIBODY, IGG: Cytomegalovirus Ab-IgG: <0.6 U/mL

## 2024-06-16 ENCOUNTER — Encounter (HOSPITAL_COMMUNITY)
Admission: RE | Disposition: A | Discharge status: Home / Self Care | Payer: Self-pay | Source: Home / Self Care | Attending: Cardiovascular Disease

## 2024-06-16 ENCOUNTER — Ambulatory Visit (HOSPITAL_COMMUNITY)
Admission: RE | Admit: 2024-06-16 | Discharge: 2024-06-16 | Disposition: A | Source: Ambulatory Visit | Attending: Cardiovascular Disease

## 2024-06-16 ENCOUNTER — Encounter (HOSPITAL_COMMUNITY): Payer: Self-pay | Admitting: Cardiovascular Disease

## 2024-06-16 ENCOUNTER — Other Ambulatory Visit: Payer: Self-pay

## 2024-06-16 ENCOUNTER — Ambulatory Visit (HOSPITAL_COMMUNITY)
Admission: RE | Admit: 2024-06-16 | Discharge: 2024-06-16 | Disposition: A | Discharge status: Home / Self Care | Attending: Cardiovascular Disease | Admitting: Cardiovascular Disease

## 2024-06-16 ENCOUNTER — Ambulatory Visit (HOSPITAL_COMMUNITY): Admitting: Anesthesiology

## 2024-06-16 DIAGNOSIS — I341 Nonrheumatic mitral (valve) prolapse: Secondary | ICD-10-CM | POA: Insufficient documentation

## 2024-06-16 DIAGNOSIS — R61 Generalized hyperhidrosis: Secondary | ICD-10-CM | POA: Diagnosis not present

## 2024-06-16 DIAGNOSIS — I340 Nonrheumatic mitral (valve) insufficiency: Secondary | ICD-10-CM | POA: Insufficient documentation

## 2024-06-16 DIAGNOSIS — R011 Cardiac murmur, unspecified: Secondary | ICD-10-CM

## 2024-06-16 DIAGNOSIS — I10 Essential (primary) hypertension: Secondary | ICD-10-CM | POA: Diagnosis not present

## 2024-06-16 HISTORY — PX: TRANSESOPHAGEAL ECHOCARDIOGRAM (CATH LAB): EP1270

## 2024-06-16 LAB — ECHO TEE

## 2024-06-16 SURGERY — TRANSESOPHAGEAL ECHOCARDIOGRAM (TEE) (CATHLAB)
Anesthesia: Monitor Anesthesia Care

## 2024-06-16 MED ORDER — SODIUM CHLORIDE 0.9 % IV SOLN
INTRAVENOUS | Status: DC
Start: 2024-06-16 — End: 2024-06-16

## 2024-06-16 MED ORDER — PHENYLEPHRINE 80 MCG/ML (10ML) SYRINGE FOR IV PUSH (FOR BLOOD PRESSURE SUPPORT)
PREFILLED_SYRINGE | INTRAVENOUS | Status: DC | PRN
  Administered 2024-06-16 (×3): 80 ug via INTRAVENOUS

## 2024-06-16 MED ORDER — PROPOFOL 10 MG/ML IV BOLUS
INTRAVENOUS | Status: DC | PRN
  Administered 2024-06-16: 50 mg via INTRAVENOUS
  Administered 2024-06-16: 100 mg via INTRAVENOUS

## 2024-06-16 MED ORDER — LIDOCAINE 2% (20 MG/ML) 5 ML SYRINGE
INTRAMUSCULAR | Status: DC | PRN
  Administered 2024-06-16: 100 mg via INTRAVENOUS

## 2024-06-16 MED ORDER — PROPOFOL 500 MG/50ML IV EMUL
INTRAVENOUS | Status: DC | PRN
  Administered 2024-06-16: 200 ug/kg/min via INTRAVENOUS

## 2024-06-16 NOTE — H&P (Signed)
 Cardiology Admission History and Physical:  Patient ID: Charles Villarreal MRN: [id number] DOB: Jul 09, 1966  Admit date: 06/16/2024  Primary Care Provider: [name] PARAS, MD Primary Cardiologist: Maude Emmer, MD  Primary Electrophysiologist:  None   Chief Complaint:  Chills/MV prolapse  Patient Profile:  Charles Villarreal is a 58 y.o. male with MV prolapse who presents for TEE.   History of Present Illness:  [name] presents for transesophageal echocardiogram.  Has mitral valve prolapse with at least moderate mitral valve regurgitation.  Has had night sweats over the past few weeks.  No fevers.  There are concerns for possible endocarditis.  He presents for transesophageal echo to evaluate this.  Denies any chest pains.  Has noted shortness of breath.  Ejection fraction 45%.  Labs reviewed.  No significant allergies noted.  No contraindication to TEE.  Past Medical History: Past Medical History:  Diagnosis Date   Allergy    occasional   Complication of anesthesia    aggression   Depression    GERD (gastroesophageal reflux disease)    Gout    HSV-1 (herpes simplex virus 1) infection    Hyperlipidemia    Hypertension    Low back pain    Sarcoidosis    Substance abuse (HCC)    in recovery from alcohol   Tremor     Past Surgical History: Past Surgical History:  Procedure Laterality Date   ANTERIOR LAT LUMBAR FUSION N/A 10/09/2017   Procedure: Thoracic twelve Corpectomy, Thoracic eleven-Lumbar one  lateral plate,   Anterior Lateral Interbody Fusion with Corpectomy Cage ;  Surgeon: Louis Shove, MD;  Location: [facility] OR;  Service: Neurosurgery;  Laterality: N/A;   APPENDECTOMY     BACK SURGERY     KNEE ARTHROPLASTY  1995   Lt   UMBILICAL HERNIA REPAIR       Allergies:    No Known Allergies  Social History:   Social History   Socioeconomic History   Marital status: Married    Spouse name: Not on file   Number of children: Not on file   Years of education: Not on file    Highest education level: Not on file  Occupational History    Employer: [employer]   Tobacco Use   Smoking status: Never   Smokeless tobacco: Former    Types: Chew    Quit date: 08/28/1996  Vaping Use   Vaping status: Never Used  Substance and Sexual Activity   Alcohol use: Not Currently    Comment: quit drinking end of march 2020   Drug use: No   Sexual activity: Not on file  Other Topics Concern   Not on file  Social History Narrative   Not on file   Social Drivers of Health   Financial Resource Strain: Low Risk  (12/28/2023)   Overall Financial Resource Strain (CARDIA)    Difficulty of Paying Living Expenses: Not hard at all  Food Insecurity: Not on file  Transportation Needs: Not on file  Physical Activity: Insufficiently Active (12/28/2023)   Exercise Vital Sign    Days of Exercise per Week: 2 days    Minutes of Exercise per Session: 40 min  Stress: No Stress Concern Present (12/28/2023)   [employer] of Occupational Health - Occupational Stress Questionnaire    Feeling of Stress : Not at all  Social Connections: Not on file  Intimate Partner Violence: Not on file     Family History:   The patient's family history  includes Alcoholism in his father, sister, and another family member; Hypertension in his mother. There is no history of Colon cancer, Esophageal cancer, Prostate cancer, or Rectal cancer.    ROS:  All other ROS reviewed and negative. Pertinent positives noted in the HPI.     Physical Exam/Data:   Vitals:   06/16/24 0920  BP: 139/81  Pulse: (!) 59  Resp: 14  Temp: 97.7 F (36.5 C)  TempSrc: Temporal  SpO2: 97%   No intake or output data in the 24 hours ending 06/16/24 0932     06/10/2024    9:12 AM 06/02/2024   10:34 AM 05/01/2024    8:39 AM  Last 3 Weights  Weight (lbs) 181 lb 192 lb 186 lb  Weight (kg) 82.101 kg 87.091 kg 84.369 kg    There is no height or weight on file to calculate BMI.  General: Well nourished, well developed, in no  acute distress Head: Atraumatic, normal size  Eyes: PEERLA, EOMI  Neck: Supple, no JVD Endocrine: No thryomegaly Cardiac: Normal S1, S2; RRR; no murmurs, rubs, or gallops Lungs: Clear to auscultation bilaterally, no wheezing, rhonchi or rales  Abd: Soft, nontender, no hepatomegaly  Ext: No edema, pulses 2+ Musculoskeletal: No deformities, BUE and BLE strength normal and equal Skin: Warm and dry, no rashes   Neuro: Alert and oriented to person, place, time, and situation, CNII-XII grossly intact, no focal deficits  Psych: Normal mood and affect    Assessment and Plan:   # MV Prolapse # Mitral regurgitation  # Night Sweats - proceed with TEE to exclude endocarditis today.  - NPO.   Informed Consent   Shared Decision Making/Informed Consent  The risks [esophageal damage, perforation (1:10,000 risk), bleeding, pharyngeal hematoma as well as other potential complications associated with conscious sedation including aspiration, arrhythmia, respiratory failure and death], benefits (treatment guidance and diagnostic support) and alternatives of a transesophageal echocardiogram were discussed in detail with Mr. [name] and he is willing to proceed.        Signed, Darryle DASEN. Barbaraann, MD, [facility]  [facility]  06/16/2024 9:32 AM

## 2024-06-16 NOTE — Anesthesia Preprocedure Evaluation (Signed)
 Anesthesia Evaluation  Patient identified by MRN, date of birth, ID band Patient awake    Reviewed: Allergy & Precautions, NPO status , Patient's Chart, lab work & pertinent test results  Airway Mallampati: II  TM Distance: >3 FB Neck ROM: Full    Dental no notable dental hx.    Pulmonary neg pulmonary ROS   Pulmonary exam normal        Cardiovascular hypertension, Pt. on medications  Rhythm:Regular Rate:Normal  ECHO 09/25:  1. Contrast swirling in the LV cavitry with no clear thrombus, new from 2023 study. Left ventricular ejection fraction, by estimation, is 45 to 50%. The left ventricle has mildly decreased function. The left ventricle demonstrates global hypokinesis. Left ventricular diastolic parameters are indeterminate. Elevated left atrial pressure. The average left ventricular global longitudinal strain is -14.6 %. The global longitudinal strain is abnormal.  2. Right ventricular systolic function is normal. The right ventricular size is normal.  3. Decreased LA reservoir strain, 12.9%. Left atrial size was severely dilated.  4. Eccentric jet. The mitral valve is myxomatous. Moderate mitral valve regurgitation. No evidence of mitral stenosis. There is prolapse of both leaflets of the mitral valve.  5. The aortic valve is tricuspid. Aortic valve regurgitation is mild. No aortic stenosis is present.   Comparison(s): Prior images reviewed side by side. Decrease in strain, 3D LVEF, and LVOT VTI without dilation of the LV. MR has increased in compariosn of the 2 chamber view.   Conclusion(s)/Recommendation(s): Consider CMR evaluation or TEE if clinically indicated.      Neuro/Psych    Depression    negative neurological ROS     GI/Hepatic Neg liver ROS,GERD  ,,  Endo/Other  negative endocrine ROS    Renal/GU negative Renal ROS  negative genitourinary   Musculoskeletal   Abdominal Normal abdominal exam  (+)   Peds   Hematology Lab Results      Component                Value               Date                      WBC                      6.4                 06/10/2024                HGB                      14.8                06/10/2024                HCT                      45.7                06/10/2024                MCV                      93.5                06/10/2024                PLT  375                 06/10/2024              Anesthesia Other Findings   Reproductive/Obstetrics                              Anesthesia Physical Anesthesia Plan  ASA: 3  Anesthesia Plan: MAC   Post-op Pain Management:    Induction: Intravenous  PONV Risk Score and Plan: 1 and Propofol  infusion and Treatment may vary due to age or medical condition  Airway Management Planned: Simple Face Mask and Nasal Cannula  Additional Equipment: None  Intra-op Plan:   Post-operative Plan:   Informed Consent: I have reviewed the patients History and Physical, chart, labs and discussed the procedure including the risks, benefits and alternatives for the proposed anesthesia with the patient or authorized representative who has indicated his/her understanding and acceptance.     Dental advisory given  Plan Discussed with: CRNA  Anesthesia Plan Comments:         Anesthesia Quick Evaluation

## 2024-06-16 NOTE — Discharge Instructions (Signed)
 TEE   YOU HAD AN CARDIAC PROCEDURE TODAY: Refer to the procedure report and other information in the discharge instructions given to you for any specific questions about what was found during the examination.   DIET: Your first meal following the procedure should be a light meal and then it is ok to progress to your normal diet. A half-sandwich or bowl of soup is an example of a good first meal. Heavy or fried foods are harder to digest and may make you feel nauseous or bloated. Drink plenty of fluids but you should avoid alcoholic beverages for 24 hours. If you had a esophageal dilation, please see attached instructions for diet.   ACTIVITY: Your care partner should take you home directly after the procedure. You should plan to take it easy, moving slowly for the rest of the day. You can resume normal activity the day after the procedure however YOU SHOULD NOT DRIVE, use power tools, machinery or perform tasks that involve climbing or major physical exertion for 24 hours (because of the sedation medicines used during the test).   SYMPTOMS TO REPORT IMMEDIATELY: Please call your cardiologist for any of the following symptoms:  Vomiting of blood or coffee ground material  New, significant abdominal pain  New, significant chest pain or pain under the shoulder blades  Painful or persistently difficult swallowing  New shortness of breath  Black, tarry-looking or red, bloody stools  FOLLOW UP:  Please also call with any specific questions about appointments or follow up tests.

## 2024-06-16 NOTE — CV Procedure (Signed)
    TRANSESOPHAGEAL ECHOCARDIOGRAM   NAME:  Charles Villarreal    MRN: [id number] DOB:  04-18-1966    ADMIT DATE: 06/16/2024  INDICATIONS: Night Sweats. MV Prolapse  PROCEDURE:   Informed consent was obtained prior to the procedure. The risks, benefits and alternatives for the procedure were discussed and the patient comprehended these risks.  Risks include, but are not limited to, cough, sore throat, vomiting, nausea, somnolence, esophageal and stomach trauma or perforation, bleeding, low blood pressure, aspiration, pneumonia, infection, trauma to the teeth and death.    Procedural time out performed. The oropharynx was anesthetized with topical 1% benzocaine.    Anesthesia was administered by Dr. Alveria.  The patient was administered 325 mg of propofol  and 100 mg of lidocaine  to achieve and maintain moderate conscious sedation.  The patient's heart rate, blood pressure, and oxygen saturation are monitored continuously during the procedure. The period of conscious sedation is 16 minutes, of which I was present face-to-face 100% of this time.   The transesophageal probe was inserted in the esophagus and stomach without difficulty and multiple views were obtained.   COMPLICATIONS:    There were no immediate complications.  KEY FINDINGS:  No evidence of endocarditis. Severe prolapse of the PMVL with moderate MR.  LVEF 50-55%.  Negative bubble.   Full report to follow. Further management per primary team.   Signed, Darryle DASEN. Barbaraann, MD, [facility]  [facility]  [address] [place], [place] [address] [phone]  10:21 AM

## 2024-06-16 NOTE — Anesthesia Postprocedure Evaluation (Signed)
 Anesthesia Post Note  Patient: Charles Villarreal  Procedure(s) Performed: TRANSESOPHAGEAL ECHOCARDIOGRAM     Patient location during evaluation: PACU Anesthesia Type: MAC Level of consciousness: awake and alert Pain management: pain level controlled Vital Signs Assessment: post-procedure vital signs reviewed and stable Respiratory status: spontaneous breathing, nonlabored ventilation, respiratory function stable and patient connected to nasal cannula oxygen Cardiovascular status: stable and blood pressure returned to baseline Postop Assessment: no apparent nausea or vomiting Anesthetic complications: no   No notable events documented.  Last Vitals:  Vitals:   06/16/24 1035 06/16/24 1040  BP: (!) 107/57 109/61  Pulse: (!) 54 (!) 52  Resp: (!) 7 10  Temp:    SpO2: 97% 97%    Last Pain:  Vitals:   06/16/24 0920  TempSrc: Temporal                 Cordella SQUIBB Annakate Soulier

## 2024-06-16 NOTE — Transfer of Care (Signed)
 Immediate Anesthesia Transfer of Care Note  Patient: Charles Villarreal  Procedure(s) Performed: TRANSESOPHAGEAL ECHOCARDIOGRAM  Patient Location: PACU  Anesthesia Type:MAC  Level of Consciousness: awake and alert   Airway & Oxygen Therapy: Patient Spontanous Breathing and Patient connected to nasal cannula oxygen  Post-op Assessment: Report given to RN, Post -op Vital signs reviewed and stable, and Patient moving all extremities X 4  Post vital signs: Reviewed and stable  Last Vitals:  Vitals Value Taken Time  BP    Temp    Pulse 55 06/16/24 10:18  Resp 22 06/16/24 10:18  SpO2 96 % 06/16/24 10:18  Vitals shown include unfiled device data.  Last Pain:  Vitals:   06/16/24 0920  TempSrc: Temporal         Complications: No notable events documented.

## 2024-06-17 LAB — CRYOGLOBULIN

## 2024-07-01 ENCOUNTER — Telehealth: Payer: Self-pay | Admitting: Internal Medicine

## 2024-07-01 ENCOUNTER — Encounter: Payer: Self-pay | Admitting: Internal Medicine

## 2024-07-01 NOTE — Telephone Encounter (Signed)
 Patient still having significant night sweats.   We will discontinue Buspar and see if perhaps that is causing symptoms.   He has had extensive work up thus far and even saw ID research scientist (medical). We have tried to arrange Endocrine referral.[facility] did not give us  an appt.    Hs has seen Dr, Delford, Cardiologist as well.

## 2024-07-08 DIAGNOSIS — F329 Major depressive disorder, single episode, unspecified: Secondary | ICD-10-CM | POA: Diagnosis not present

## 2024-07-11 ENCOUNTER — Encounter: Payer: Self-pay | Admitting: Internal Medicine

## 2024-07-11 ENCOUNTER — Other Ambulatory Visit: Payer: Self-pay

## 2024-07-11 ENCOUNTER — Ambulatory Visit: Admitting: Internal Medicine

## 2024-07-11 ENCOUNTER — Ambulatory Visit (INDEPENDENT_AMBULATORY_CARE_PROVIDER_SITE_OTHER): Payer: Self-pay | Admitting: Internal Medicine

## 2024-07-11 ENCOUNTER — Other Ambulatory Visit

## 2024-07-11 VITALS — BP 120/80 | HR 60 | Ht 72.0 in | Wt 187.0 lb

## 2024-07-11 VITALS — BP 118/68 | HR 64 | Temp 98.8°F | Resp 16 | Wt 186.0 lb

## 2024-07-11 DIAGNOSIS — R61 Generalized hyperhidrosis: Secondary | ICD-10-CM

## 2024-07-11 DIAGNOSIS — Z862 Personal history of diseases of the blood and blood-forming organs and certain disorders involving the immune mechanism: Secondary | ICD-10-CM

## 2024-07-11 DIAGNOSIS — G250 Essential tremor: Secondary | ICD-10-CM

## 2024-07-11 DIAGNOSIS — E782 Mixed hyperlipidemia: Secondary | ICD-10-CM

## 2024-07-11 DIAGNOSIS — I10 Essential (primary) hypertension: Secondary | ICD-10-CM

## 2024-07-11 NOTE — Patient Instructions (Addendum)
 Testosterone  levels drawn. Continue Robinul  for hyperhidrosis. See Infectious Disease physician on December 12th.

## 2024-07-11 NOTE — Progress Notes (Addendum)
 Patient Care Team: [name] PARAS, MD as PCP - General (Internal Medicine) Delford Maude BROCKS, MD as PCP - Cardiology (Cardiology) Tat, Asberry RAMAN, DO as Consulting Physician (Neurology)  Visit Date: 07/11/24  Subjective:    Patient ID: Charles Villarreal , Male   DOB: 1965-09-19, 58 y.o.    MRN: [id number]   58 y.o. Male presents today for follow up on  night sweats. Patient has a past medical history of impaired glucose tolerance, hyperlipidemia, anxiety.  [facility] of [facility] sweats. Has had cardiac evaluation with transesophageal echocardiogram showing myxomatous mitral valve with prolapse and moderate MR.  He was last seen in this office on 06/03/2024 for night sweats. He has a history of Sarcoidosis with his last flare  up being in 2011. Has been seen by Pulmonary, Dr. Zaida in August. Chest CT revealed no acte findings. No mass, no lymphadenopathy or metastatic disease. Main pulmonary artery was enlarged suggestive of pulmonary HTN. CAD was present and pt had 0.3cm nodule  in posterior right upper lobe. Pt felt to be at low risk for cancer. No sarcoidosis was identified.   He has been prescribed Robinul  1 mg twice daily. TB and HIV testing was negative. Blood culture as negative for blood infection. TSH, CRP and calcium  were negative. On 03/24/2024 ANA was Positive but Anti Smith antibody was negative so I do not think he has SLE. Sed rate was 9.  He is still experiencing night sweats. Most recently has been seen by Dr. Overton at [facility]. Had very thorough evaluation.   Past Medical History:  Diagnosis Date   Allergy    occasional   Complication of anesthesia    aggression   Depression    GERD (gastroesophageal reflux disease)    Gout    HSV-1 (herpes simplex virus 1) infection    Hyperlipidemia    Hypertension    Low back pain    Sarcoidosis    Substance abuse (HCC)    in recovery from alcohol   Tremor       Family History  Problem Relation Age of Onset   Alcoholism Father    Alcoholism Sister    Hypertension Mother    Alcoholism Other    Colon cancer Neg Hx    Esophageal cancer Neg Hx    Prostate cancer Neg Hx    Rectal cancer Neg Hx       Review of Systems  Constitutional:  Positive for diaphoresis.  All other systems reviewed and are negative.       Objective:   Vitals: Ht 6' (1.829 m)   BMI 25.23 kg/m    Physical Exam Vitals and nursing note reviewed.     Chest is clear. Cor: RRR without murmur. Currently in no acute distress and has no diaphoresis. No LE edema.  Results:    Labs:       Component Value Date/Time   Villarreal 138 06/10/2024 0928   K 4.2 06/10/2024 0928   CL 103 06/10/2024 0928   CO2 23 06/10/2024 0928   GLUCOSE 100 (H) 06/10/2024 0928   BUN 13 06/10/2024 0928   CREATININE 1.10 06/10/2024 0928   CALCIUM  10.0 06/10/2024 0928   PROT 7.4 06/10/2024 0928   PROT 7.5 06/10/2024 0928   ALBUMIN  4.1 02/03/2018 0046   AST 19 06/10/2024 0928   ALT 15 06/10/2024 0928   ALKPHOS 94 02/03/2018 0046   BILITOT 0.6 06/10/2024  9071   GFRNONAA 89 02/14/2021 1142   GFRAA 103 02/14/2021 1142     Lab Results  Component Value Date   WBC 6.4 06/10/2024   HGB 14.8 06/10/2024   HCT 45.7 06/10/2024   MCV 93.5 06/10/2024   PLT 375 06/10/2024    Lab Results  Component Value Date   CHOL 136 05/31/2023   HDL 49 05/31/2023   LDLCALC 71 05/31/2023   LDLDIRECT 172.2 05/17/2012   TRIG 78 05/31/2023   CHOLHDL 2.8 05/31/2023    Lab Results  Component Value Date   HGBA1C 5.7 (H) 12/31/2023     Lab Results  Component Value Date   TSH 0.85 06/02/2024     Lab Results  Component Value Date   PSA 3.34 03/24/2024   PSA 1.26 05/31/2023   PSA 1.20 03/16/2022     Assessment & Plan:   Orders Placed This Encounter  Procedures   Testosterone  , Free and Total    Night Sweats: He was last seen in this office on 06/03/2024 for night sweats. He has a history  of Sarcoidosis with his last flare up being in 2011. Has been seen by Pulmonary, Dr. Zaida, in August. Chest CT revealed no acte findings. No mass, no lymphadenopathy or metastatic disease. Main pulmonary artery was enlarged suggestive of pulmonary HTN. CAD was present and pt had 0.3cm nodule  in posterior right upper lobe. Pt felt to be at low risk for cancer. No sarcoidosis was identified. He was prescribed Robinul  1 mg daily. TB and HIV testing was negative. Blood culture as negative for blood infection. TSH, CRP and calcium  were negative. On 03/24/2024 ANA was Positive but Anti Smith antibody was negative so I did not think he has SLE. Sed rate was 9. He has been evaluated by Cardiology and thought not to have Endocarditis. He is still experiencing night sweats. Most recently has been seen by Dr. [facility]ton [facility]. Currently on Robinul  1 mg twice daily.  Total and free testosterone  ordered at patient request. These values are normal.  Robinul  dose can be increased to 2 mg twice daily if patient desires. He wants to wait on this for now.  Essential hypertension- stable   Hyperlipidemia treated with Crestor  and followed by Cardiology  Plan: Testosterone  levels drawn today. Has follow up with Infectious Disease physician on December 12 I,Makayla C Reid,acting as a neurosurgeon for Ronal JINNY Hailstone, MD.,have documented all relevant documentation on the behalf of Ronal JINNY Hailstone, MD,as directed by  Ronal JINNY Hailstone, MD while in the presence of Ronal JINNY Hailstone, MD.  I, Ronal JINNY Hailstone, MD, have reviewed all documentation for this visit. The documentation on 07/11/2024 for the exam, diagnosis, procedures, and orders are all accurate and complete.

## 2024-07-11 NOTE — Progress Notes (Signed)
 [facility]  Reason for Consult:nightsweat Referring Provider: Ronal Hailstone    Patient Active Problem List   Diagnosis Date Noted   Pilonidal sinus without abscess 06/01/2023   Alcoholism (HCC) 04/30/2018   T12 burst fracture (HCC) 10/09/2017   Gout 11/29/2015   Left lateral ankle pain 03/16/2015   Acute upper respiratory infection 08/30/2013   Sarcoid 08/26/2013   Hyperlipidemia 11/10/2012   Fine tremor 10/22/2012   Gait instability 10/22/2012   Erectile dysfunction 06/07/2012   Benign positional vertigo 06/07/2012   Essential tremor 06/07/2012   Pre-syncope 05/18/2012   Chronic rhinitis 10/08/2011   HYPERTENSION, BENIGN 02/22/2010   GERD 02/22/2010   PULMONARY SARCOIDOSIS 10/30/2007   INSOMNIA 10/30/2007      HPI: Charles Villarreal is a 58 y.o. male ang[place] origin, never smoker, hx pulmonary sarcoid referred here for nightsweat    Patient developed nightsweat starting 01/2024. Initially thought to have pna although no symptoms of such and treated as such. He sweat every night where he has to change clothes/sheet  No other symptoms besides that Eat well/good appetite No weight loss No headache, lightheadedness, numbness, tingling, focal weakness; vision/hearing unchanged No rash No joint pain No n/v/diarrhea, abdominal pain No facial flushing No cough/wheezing/dyspnea  His doctor has done extensive testing/imaging and so far nothing revealing He had seen his pulmonologist who doesn't think sarcoid is active. He hasn't been on anything for sarcoid until 01/2024 when he was given a steroid taper just to make sure it is not; he had finished this 10 day course a while ago   This nightsweat trend has stayed the same since onset    He has pending testosterone  check    He has dogs no cats He hunts pheasants -- last hunt 06/14/2024 (the only hunting trip this year) In early June 2025 he noticed 2 ticks on him He doesn't  live around a farm Starting spring 2025 until today -- travel hx [place]  late October [place] late sept  [place], [place] early November   No hx consuming unpasteurized dairy products  He doesn't live near any farm  He owns and operate funeral business. No enbalming; just people contact    He hasn't noticed his blood pressure excessive high in 200s or palpitation/bouncing heart rate   He was rejected from being seen by endocrinology    His pcp is in the process of titrating him off medications to make sure it's not medication related   Review of Systems: ROS All other ros negative      Past Medical History:  Diagnosis Date   Allergy    occasional   Complication of anesthesia    aggression   Depression    GERD (gastroesophageal reflux disease)    Gout    HSV-1 (herpes simplex virus 1) infection    Hyperlipidemia    Hypertension    Low back pain    Sarcoidosis    Substance abuse (HCC)    in recovery from alcohol   Tremor     Social History   Tobacco Use   Smoking status: Never   Smokeless tobacco: Former    Types: Chew    Quit date: 08/28/1996  Vaping Use   Vaping status: Never Used  Substance Use Topics   Alcohol use: Not Currently    Comment: quit drinking end of march 2020   Drug use: No    Family History  Problem Relation Age of Onset  Alcoholism Father    Alcoholism Sister    Hypertension Mother    Alcoholism Other    Colon cancer Neg Hx    Esophageal cancer Neg Hx    Prostate cancer Neg Hx    Rectal cancer Neg Hx     No Known Allergies  OBJECTIVE: Vitals:   07/11/24 0915 07/11/24 0916  BP:  118/68  Pulse:  64  Resp:  16  Temp:  98.8 F (37.1 C)  TempSrc:  Temporal  SpO2:  99%  Weight: 186 lb (84.4 kg)    Body mass index is 25.23 kg/m.   Physical Exam General/constitutional: no distress, pleasant HEENT: Normocephalic, PER, Conj Clear, EOMI, Oropharynx clear Neck supple CV: rrr no mrg Lungs: clear to auscultation,  normal respiratory effort Abd: Soft, Nontender Ext: no edema Skin: No Rash Neuro: nonfocal MSK: no peripheral joint swelling/tenderness/warmth; back spines nontender   Lab: Lab Results  Component Value Date   WBC 6.4 06/10/2024   HGB 14.8 06/10/2024   HCT 45.7 06/10/2024   MCV 93.5 06/10/2024   PLT 375 06/10/2024   Last metabolic panel Lab Results  Component Value Date   GLUCOSE 100 (H) 06/10/2024   NA 138 06/10/2024   K 4.2 06/10/2024   CL 103 06/10/2024   CO2 23 06/10/2024   BUN 13 06/10/2024   CREATININE 1.10 06/10/2024   EGFR 85 12/28/2023   CALCIUM  10.0 06/10/2024   PROT 7.4 06/10/2024   PROT 7.5 06/10/2024   ALBUMIN  4.1 02/03/2018   BILITOT 0.6 06/10/2024   ALKPHOS 94 02/03/2018   AST 19 06/10/2024   ALT 15 06/10/2024   ANIONGAP 9 12/05/2018   Lab Results  Component Value Date   TSH 0.85 06/02/2024    Microbiology:  Serology:  Imaging: 06/09/24 ct abd pelv with contrast FINDINGS: Lower Chest: No acute findings.   Hepatobiliary: No suspicious hepatic masses identified. Gallbladder is unremarkable. No evidence of biliary ductal dilatation.   Pancreas:  No mass or inflammatory changes.   Spleen: Within normal limits in size and appearance.   Adrenals/Urinary Tract: Normal appearance of both adrenal glands. No adrenal masses identified. A few benign appearing renal cysts are noted bilaterally (No followup imaging is recommended). No renal masses identified. No evidence of ureteral calculi or hydronephrosis.   Stomach/Bowel: No evidence of obstruction, inflammatory process or abnormal fluid collections. Diverticulosis is seen mainly involving the descending and sigmoid colon, however there is no evidence of diverticulitis.   Vascular/Lymphatic: No pathologically enlarged lymph nodes. No acute vascular findings.   Reproductive:  No mass or other significant abnormality.   Other:  None.   Musculoskeletal: No suspicious bone lesions  identified. Lumbar spine fusion hardware noted.   IMPRESSION: No evidence of adrenal mass.   Colonic diverticulosis, without radiographic evidence of diverticulitis or other acute findings.  03/28/24 ct chest with contrast 1. No acute CT findings of the chest. No acute airspace disease. 2. No mass, lymphadenopathy, or metastatic disease in the chest. 3. No imaging evidence of sarcoidosis in the chest. 4. Enlargement of the main pulmonary artery, as can be seen in pulmonary hypertension. 5. Cardiomegaly and coronary artery disease. 6. There is a 0.3 cm nodule of the posterior right upper lobe. No follow-up needed if patient is low-risk.  06/21/24 tee no vegetation   Assessment/plan: Problem List Items Addressed This Visit   None Visit Diagnoses       Chronic night sweats    -  Primary   Relevant Orders  Histoplasma Antibodies   Blastomyces Ab, ID   BARTONELLA QUINTANA ANTIBODY (IGG) WITH REFLEX TO TITER   BARTONELLA HENSELAE ANTIBODY (IGG) WITH REFLEX TO TITER   Other/Misc lab test       58 yo male hx pulm sarcoid in remission referred for nightsweat since 01/2024  Micros: 09/2012 hep b sAb positive; cAb and sAg negative 06/02/24 quantiferon gold negative; hiv negative 06/10/24 EBV vca and NA igg positive; vca igm negative 06/10/24 cmv igm and igg negative 06/10/24 rpr negative 06/10/24 hep c ab negatve   Other labs: 03/24/24 ana 1:40 04/03/24 ena SM Ab negative 04/03/24 psa 5 06/02/24 bcx negative 1 set 06/02/24 crp < 3 06/10/24 crp <3; esr 11 06/10/24 ferritin 115 06/10/24 spep normal 06/10/24 cryoglobulin tests negative 06/10/24 ACE level 22 06/10/24 blood cx negative 2 of 2 sets 06/10/24 ldh 151   He epidimiologic exposure include pheasants hunting, pet dog and local [place] travel all occurring after onset of sx  No sign of psitsacosis  He has a couple tick bites 01/2024 which is not classic for relapsing fever. His inflammatory  markers/basic labs have been normal throughout these nightsweat episodes  He doesn't have any other concomittant symptoms/signs to suggest a story of infectious process  Basic rheum labs, tee, ct scan also have been benign    I do not suspect an infectious process, but we can send endemic fungal serology, bartonella pcr/serology including bartonella quintana    I am reluctant to send qfever/   I really appreciate his primary care doctor [name] in her thorough and methodic approach to his nightsweat including titrating off medications. His next step is testosterone  testing for male menopause   -labs as mentioned -f/u testosterone  level -if all negative can consider pet-ct scan -a karius test is rather not the right setting to do here  -f/u 4 weeks with me; he'll do labs next week  Follow-up: Return in about 4 weeks (around 08/08/2024).  Constance ONEIDA Passer, MD [facility] 07/11/2024, 9:33 AM

## 2024-07-16 DIAGNOSIS — F329 Major depressive disorder, single episode, unspecified: Secondary | ICD-10-CM | POA: Diagnosis not present

## 2024-07-17 ENCOUNTER — Ambulatory Visit: Payer: Self-pay | Admitting: Internal Medicine

## 2024-07-17 LAB — TESTOSTERONE, FREE & TOTAL
Free Testosterone: 45.9 pg/mL (ref 35.0–155.0)
Testosterone, Total, LC-MS-MS: 512 ng/dL (ref 250–1100)

## 2024-07-28 DIAGNOSIS — F329 Major depressive disorder, single episode, unspecified: Secondary | ICD-10-CM | POA: Diagnosis not present

## 2024-07-31 ENCOUNTER — Other Ambulatory Visit: Payer: Self-pay | Admitting: Internal Medicine

## 2024-08-08 ENCOUNTER — Ambulatory Visit: Admitting: Internal Medicine

## 2024-08-08 NOTE — Telephone Encounter (Signed)
 done

## 2024-08-15 DIAGNOSIS — F1020 Alcohol dependence, uncomplicated: Secondary | ICD-10-CM | POA: Diagnosis not present

## 2024-09-30 ENCOUNTER — Other Ambulatory Visit: Payer: Self-pay | Admitting: Internal Medicine
